# Patient Record
Sex: Male | Born: 1950 | Race: Black or African American | Hispanic: No | Marital: Single | Smoking: Current every day smoker
Health system: Southern US, Community
[De-identification: ages and names within clinical notes are randomized; demographics above are authoritative.]

## PROBLEM LIST (undated history)

## (undated) DIAGNOSIS — F0390 Unspecified dementia without behavioral disturbance: Secondary | ICD-10-CM

## (undated) DIAGNOSIS — I639 Cerebral infarction, unspecified: Secondary | ICD-10-CM

## (undated) DIAGNOSIS — J449 Chronic obstructive pulmonary disease, unspecified: Secondary | ICD-10-CM

## (undated) DIAGNOSIS — Z720 Tobacco use: Secondary | ICD-10-CM

---

## 1999-07-26 DIAGNOSIS — I639 Cerebral infarction, unspecified: Secondary | ICD-10-CM

## 1999-07-26 HISTORY — DX: Cerebral infarction, unspecified: I63.9

## 2005-08-11 ENCOUNTER — Emergency Department (HOSPITAL_COMMUNITY): Admission: EM | Admit: 2005-08-11 | Discharge: 2005-08-11 | Payer: Self-pay | Admitting: Emergency Medicine

## 2005-08-26 ENCOUNTER — Emergency Department (HOSPITAL_COMMUNITY): Admission: EM | Admit: 2005-08-26 | Discharge: 2005-08-26 | Payer: Self-pay | Admitting: Emergency Medicine

## 2005-09-11 ENCOUNTER — Inpatient Hospital Stay (HOSPITAL_COMMUNITY): Admission: EM | Admit: 2005-09-11 | Discharge: 2005-09-13 | Payer: Self-pay | Admitting: Emergency Medicine

## 2011-06-26 ENCOUNTER — Emergency Department (HOSPITAL_COMMUNITY): Payer: Self-pay

## 2011-06-26 ENCOUNTER — Inpatient Hospital Stay (HOSPITAL_COMMUNITY)
Admission: EM | Admit: 2011-06-26 | Discharge: 2011-06-27 | DRG: 189 | Disposition: A | Discharge status: Home / Self Care | Payer: Self-pay | Attending: Internal Medicine | Admitting: Internal Medicine

## 2011-06-26 DIAGNOSIS — Z720 Tobacco use: Secondary | ICD-10-CM | POA: Diagnosis present

## 2011-06-26 DIAGNOSIS — J209 Acute bronchitis, unspecified: Secondary | ICD-10-CM | POA: Diagnosis present

## 2011-06-26 DIAGNOSIS — J4489 Other specified chronic obstructive pulmonary disease: Secondary | ICD-10-CM | POA: Diagnosis present

## 2011-06-26 DIAGNOSIS — J440 Chronic obstructive pulmonary disease with acute lower respiratory infection: Secondary | ICD-10-CM | POA: Diagnosis present

## 2011-06-26 DIAGNOSIS — J069 Acute upper respiratory infection, unspecified: Secondary | ICD-10-CM

## 2011-06-26 DIAGNOSIS — F17200 Nicotine dependence, unspecified, uncomplicated: Secondary | ICD-10-CM | POA: Diagnosis present

## 2011-06-26 DIAGNOSIS — J449 Chronic obstructive pulmonary disease, unspecified: Secondary | ICD-10-CM | POA: Diagnosis present

## 2011-06-26 DIAGNOSIS — J9600 Acute respiratory failure, unspecified whether with hypoxia or hypercapnia: Principal | ICD-10-CM | POA: Diagnosis present

## 2011-06-26 HISTORY — DX: Tobacco use: Z72.0

## 2011-06-26 HISTORY — DX: Cerebral infarction, unspecified: I63.9

## 2011-06-26 LAB — DIFFERENTIAL
Eosinophils Absolute: 0 10*3/uL (ref 0.0–0.7)
Lymphocytes Relative: 12 % (ref 12–46)
Lymphs Abs: 0.8 10*3/uL (ref 0.7–4.0)
Monocytes Absolute: 0.9 10*3/uL (ref 0.1–1.0)
Neutrophils Relative %: 73 % (ref 43–77)

## 2011-06-26 LAB — INFLUENZA PANEL BY PCR (TYPE A & B): Influenza A By PCR: NEGATIVE

## 2011-06-26 LAB — URINALYSIS, ROUTINE W REFLEX MICROSCOPIC
Bilirubin Urine: NEGATIVE
Glucose, UA: NEGATIVE mg/dL
Protein, ur: NEGATIVE mg/dL
Specific Gravity, Urine: 1.02 (ref 1.005–1.030)
Urobilinogen, UA: 0.2 mg/dL (ref 0.0–1.0)

## 2011-06-26 LAB — CBC
MCHC: 34.7 g/dL (ref 30.0–36.0)
WBC: 7 10*3/uL (ref 4.0–10.5)

## 2011-06-26 LAB — BASIC METABOLIC PANEL
BUN: 14 mg/dL (ref 6–23)
Chloride: 99 mEq/L (ref 96–112)
GFR calc non Af Amer: 59 mL/min — ABNORMAL LOW (ref >90)
Glucose, Bld: 99 mg/dL (ref 70–99)
Potassium: 4 mEq/L (ref 3.5–5.1)
Sodium: 137 mEq/L (ref 135–145)

## 2011-06-26 LAB — HEPATIC FUNCTION PANEL
ALT: 21 U/L (ref 0–53)
Alkaline Phosphatase: 59 U/L (ref 39–117)
Bilirubin, Direct: 0.1 mg/dL (ref 0.0–0.3)
Indirect Bilirubin: 0.5 mg/dL (ref 0.3–0.9)

## 2011-06-26 LAB — HIV ANTIBODY (ROUTINE TESTING W REFLEX): HIV: NONREACTIVE

## 2011-06-26 LAB — URINE MICROSCOPIC-ADD ON

## 2011-06-26 MED ORDER — PANTOPRAZOLE SODIUM 40 MG PO TBEC
40.0000 mg | DELAYED_RELEASE_TABLET | Freq: Every day | ORAL | Status: DC
  Administered 2011-06-26 – 2011-06-27 (×2): 40 mg via ORAL
  Filled 2011-06-26 (×2): qty 1

## 2011-06-26 MED ORDER — ALBUTEROL SULFATE (5 MG/ML) 0.5% IN NEBU
2.5000 mg | INHALATION_SOLUTION | RESPIRATORY_TRACT | Status: DC
  Administered 2011-06-26 – 2011-06-27 (×5): 2.5 mg via RESPIRATORY_TRACT
  Filled 2011-06-26 (×5): qty 0.5

## 2011-06-26 MED ORDER — ALBUTEROL SULFATE (5 MG/ML) 0.5% IN NEBU
5.0000 mg | INHALATION_SOLUTION | Freq: Once | RESPIRATORY_TRACT | Status: AC
  Administered 2011-06-26: 5 mg via RESPIRATORY_TRACT
  Filled 2011-06-26: qty 1

## 2011-06-26 MED ORDER — OXYCODONE HCL 5 MG PO TABS: 5.0000 mg | ORAL_TABLET | ORAL | Status: DC | PRN

## 2011-06-26 MED ORDER — METHYLPREDNISOLONE SODIUM SUCC 125 MG IJ SOLR
60.0000 mg | Freq: Four times a day (QID) | INTRAMUSCULAR | Status: DC
  Filled 2011-06-26: qty 2

## 2011-06-26 MED ORDER — TRAZODONE HCL 50 MG PO TABS: 25.0000 mg | ORAL_TABLET | Freq: Every evening | ORAL | Status: DC | PRN

## 2011-06-26 MED ORDER — ACETAMINOPHEN 325 MG PO TABS: 650.0000 mg | ORAL_TABLET | Freq: Four times a day (QID) | ORAL | Status: DC | PRN

## 2011-06-26 MED ORDER — IPRATROPIUM BROMIDE 0.02 % IN SOLN
0.5000 mg | Freq: Once | RESPIRATORY_TRACT | Status: AC
  Administered 2011-06-26: 0.5 mg via RESPIRATORY_TRACT
  Filled 2011-06-26: qty 2.5

## 2011-06-26 MED ORDER — THERA M PLUS PO TABS
1.0000 | ORAL_TABLET | Freq: Every day | ORAL | Status: DC
  Administered 2011-06-26 – 2011-06-27 (×2): 1 via ORAL
  Filled 2011-06-26 (×2): qty 1

## 2011-06-26 MED ORDER — BIOTENE DRY MOUTH MT LIQD
15.0000 mL | Freq: Three times a day (TID) | OROMUCOSAL | Status: DC
  Administered 2011-06-26 – 2011-06-27 (×3): 15 mL via OROMUCOSAL

## 2011-06-26 MED ORDER — GUAIFENESIN ER 600 MG PO TB12
1200.0000 mg | ORAL_TABLET | Freq: Two times a day (BID) | ORAL | Status: DC
  Administered 2011-06-26 – 2011-06-27 (×3): 1200 mg via ORAL
  Filled 2011-06-26 (×3): qty 2

## 2011-06-26 MED ORDER — IPRATROPIUM BROMIDE 0.02 % IN SOLN
0.5000 mg | RESPIRATORY_TRACT | Status: DC
  Administered 2011-06-26 – 2011-06-27 (×5): 0.5 mg via RESPIRATORY_TRACT
  Filled 2011-06-26 (×5): qty 2.5

## 2011-06-26 MED ORDER — POTASSIUM CHLORIDE IN NACL 20-0.9 MEQ/L-% IV SOLN
INTRAVENOUS | Status: DC
  Administered 2011-06-26: 1000 mL via INTRAVENOUS

## 2011-06-26 MED ORDER — ENOXAPARIN SODIUM 40 MG/0.4ML ~~LOC~~ SOLN
40.0000 mg | SUBCUTANEOUS | Status: DC
  Administered 2011-06-26 – 2011-06-27 (×2): 40 mg via SUBCUTANEOUS
  Filled 2011-06-26 (×2): qty 0.4

## 2011-06-26 MED ORDER — VITAMIN B-1 100 MG PO TABS
100.0000 mg | ORAL_TABLET | Freq: Every day | ORAL | Status: DC
  Administered 2011-06-26 – 2011-06-27 (×2): 100 mg via ORAL
  Filled 2011-06-26 (×2): qty 1

## 2011-06-26 MED ORDER — ACETAMINOPHEN 650 MG RE SUPP: 650.0000 mg | Freq: Four times a day (QID) | RECTAL | Status: DC | PRN

## 2011-06-26 MED ORDER — NICOTINE 21 MG/24HR TD PT24
21.0000 mg | MEDICATED_PATCH | Freq: Every day | TRANSDERMAL | Status: DC
  Administered 2011-06-26 – 2011-06-27 (×2): 21 mg via TRANSDERMAL
  Filled 2011-06-26 (×2): qty 1

## 2011-06-26 MED ORDER — METHYLPREDNISOLONE SODIUM SUCC 125 MG IJ SOLR
125.0000 mg | Freq: Once | INTRAMUSCULAR | Status: AC
  Administered 2011-06-26: 125 mg via INTRAVENOUS
  Filled 2011-06-26: qty 2

## 2011-06-26 MED ORDER — FOLIC ACID 1 MG PO TABS
1.0000 mg | ORAL_TABLET | Freq: Every day | ORAL | Status: DC
  Administered 2011-06-26 – 2011-06-27 (×2): 1 mg via ORAL
  Filled 2011-06-26 (×2): qty 1

## 2011-06-26 MED ORDER — SODIUM CHLORIDE 0.9 % IV BOLUS (SEPSIS)
500.0000 mL | Freq: Once | INTRAVENOUS | Status: AC
  Administered 2011-06-26: 1000 mL via INTRAVENOUS

## 2011-06-26 MED ORDER — DEXTROSE 5 % IV SOLN
500.0000 mg | INTRAVENOUS | Status: DC
  Administered 2011-06-26 – 2011-06-27 (×2): 500 mg via INTRAVENOUS
  Filled 2011-06-26 (×3): qty 500

## 2011-06-26 MED ORDER — PREDNISONE 20 MG PO TABS
40.0000 mg | ORAL_TABLET | Freq: Every day | ORAL | Status: DC
  Administered 2011-06-26 – 2011-06-27 (×2): 40 mg via ORAL
  Filled 2011-06-26 (×2): qty 2

## 2011-06-26 MED ORDER — DEXTROSE 5 % IV SOLN
INTRAVENOUS | Status: AC
  Filled 2011-06-26: qty 500

## 2011-06-26 MED ORDER — ONDANSETRON HCL 4 MG PO TABS: 4.0000 mg | ORAL_TABLET | Freq: Four times a day (QID) | ORAL | Status: DC | PRN

## 2011-06-26 MED ORDER — ALBUTEROL SULFATE (5 MG/ML) 0.5% IN NEBU: 2.5000 mg | INHALATION_SOLUTION | RESPIRATORY_TRACT | Status: DC | PRN

## 2011-06-26 MED ORDER — ONDANSETRON HCL 4 MG/2ML IJ SOLN: 4.0000 mg | Freq: Four times a day (QID) | INTRAMUSCULAR | Status: DC | PRN

## 2011-06-26 NOTE — Plan of Care (Signed)
Problem: Phase I Progression Outcomes Goal: Flu/PneumoVaccines if indicated Outcome: Not Applicable Date Met:  06/26/11 Patient refused

## 2011-06-26 NOTE — ED Notes (Signed)
Pt stated he has been sick w/ cold all week, become worse tonight

## 2011-06-26 NOTE — H&P (Signed)
PCP:   No primary provider on file.   Chief Complaint:  Worsening shortness of breath since last night  HPI: Jose Schaefer is an 60 y.o. African American male.   History of asthma since childhood, chronic wheezing worsened by his tobacco use of 40 years, no primary medical physician for about 20 years, gets episodic treatment for his wheezing when he gets incarcerated, but otherwise takes no outpatient medications and just tolerates it is shortness of breath, Reports progressively severe wheezing since last night, came into the emergency room and responded to nebulizations but when he got up to walk his oxygen saturation fell into the 70s. Hospitalist service was called to assist with management.  Patient denies fever, but he is having a cough productive of green sputum. Denies chest pain. Smokes one pack per day for 40 years. Marijuana occasionally. Cocaine last use yesterday.  Is currently on retired and on disability because of her injury to his neck, and a past history of stroke in 2001. Retired Occupational psychologist of [employer]:  The patient denies anorexia, fever, weight loss,, vision loss, decreased hearing, hoarseness, chest pain, syncope, , peripheral edema, balance deficits, hemoptysis, abdominal pain, melena, hematochezia, severe indigestion/heartburn, hematuria, incontinence, genital sores, muscle weakness, suspicious skin lesions, transient blindness, difficulty walking, depression, unusual weight change, abnormal bleeding, enlarged lymph nodes, angioedema, and breast masses.    Past Medical History  Diagnosis Date  . Asthma   . Tobacco abuse   . CVA (cerebral vascular accident) 2001    [place]    History reviewed. No pertinent past surgical history.  Medications: None HOME MEDS: Prior to Admission medications   Not on File     Allergies:  No Known Allergies  Social History:   reports that he has been smoking Cigarettes.  He has a 40 pack-year smoking  history. He does not have any smokeless tobacco history on file. He reports that he drinks alcohol. He reports that he uses illicit drugs (Cocaine and Marijuana).  Family History: No family history on file. Denies any significant family history  Physical Exam: Filed Vitals:   06/26/11 0052 06/26/11 0119 06/26/11 0241 06/26/11 0331  BP:   114/77   Pulse:   103   Temp:      TempSrc:      Resp:   18   Height:  5\' 6"  (1.676 m)    Weight:  56.7 kg (125 lb)    SpO2: 96%  96% 96%   Blood pressure 114/77, pulse 103, temperature 99.3 F (37.4 C), temperature source Oral, resp. rate 18, height 5\' 6"  (1.676 m), weight 56.7 kg (125 lb), SpO2 96.00%.  GEN:  Pleasant thin middle-aged African American gentleman in the stretcher; looks older than his stated age; moderate respiratory distress cooperative with exam PSYCH:  alert and oriented x4; does not appear anxious does not appear depressed; affect is normal HEENT: Mucous membranes pink and dry and anicteric; PERRLA; EOM intact; no cervical lymphadenopathy nor thyromegaly or carotid bruit; no JVD; Breasts:: Not examined CHEST WALL: No tenderness CHEST: Tachypneic; diffuse rhonchi and wheezing  HEART: Regular rate and rhythm; no murmurs rubs or gallops BACK: No kyphosis or scoliosis; no CVA tenderness ABDOMEN: Scaphoid soft non-tender; no masses, no organomegaly, normal abdominal bowel sounds;  Rectal Exam: Not done EXTREMITIES: ; age-appropriate arthropathy of the hands and knees; no edema; no ulcerations. Genitalia: not examined PULSES: 2+ and symmetric SKIN: Normal hydration no rash or ulceration CNS: Cranial nerves 2-12 grossly intact no focal  neurologic deficit   Labs & Imaging Results for orders placed during the hospital encounter of 06/26/11 (from the past 48 hour(s))  CBC     Status: Normal   Collection Time   06/26/11 12:50 AM      Component Value Range Comment   WBC 7.0  4.0 - 10.5 (K/uL)    RBC 4.84  4.22 - 5.81 (MIL/uL)     Hemoglobin 14.6  13.0 - 17.0 (g/dL)    HCT 91.4  78.2 - 95.6 (%)    MCV 87.0  78.0 - 100.0 (fL)    MCH 30.2  26.0 - 34.0 (pg)    MCHC 34.7  30.0 - 36.0 (g/dL)    RDW 21.3  08.6 - 57.8 (%)    Platelets 183  150 - 400 (K/uL)   DIFFERENTIAL     Status: Abnormal   Collection Time   06/26/11 12:50 AM      Component Value Range Comment   Neutrophils Relative 73  43 - 77 (%)    Neutro Abs 5.1  1.7 - 7.7 (K/uL)    Lymphocytes Relative 12  12 - 46 (%)    Lymphs Abs 0.8  0.7 - 4.0 (K/uL)    Monocytes Relative 13 (*) 3 - 12 (%)    Monocytes Absolute 0.9  0.1 - 1.0 (K/uL)    Eosinophils Relative 1  0 - 5 (%)    Eosinophils Absolute 0.0  0.0 - 0.7 (K/uL)    Basophils Relative 1  0 - 1 (%)    Basophils Absolute 0.1  0.0 - 0.1 (K/uL)   BASIC METABOLIC PANEL     Status: Abnormal   Collection Time   06/26/11 12:50 AM      Component Value Range Comment   Sodium 137  135 - 145 (mEq/L)    Potassium 4.0  3.5 - 5.1 (mEq/L)    Chloride 99  96 - 112 (mEq/L)    CO2 30  19 - 32 (mEq/L)    Glucose, Bld 99  70 - 99 (mg/dL)    BUN 14  6 - 23 (mg/dL)    Creatinine, Ser 4.69  0.50 - 1.35 (mg/dL)    Calcium 9.3  8.4 - 10.5 (mg/dL)    GFR calc non Af Amer 59 (*) >90 (mL/min)    GFR calc Af Amer 69 (*) >90 (mL/min)   TROPONIN I     Status: Normal   Collection Time   06/26/11 12:50 AM      Component Value Range Comment   Troponin I <0.30  <0.30 (ng/mL)    Dg Chest 2 View  06/26/2011  *RADIOLOGY REPORT*  Clinical Data: Shortness of breath and cough  CHEST - 2 VIEW  Comparison: Chest radiograph 09/12/2005  Findings: Stable heart and mediastinal contours.  Advanced emphysema.  No airspace disease, effusion or pneumothorax identified.  No acute bony abnormality.  IMPRESSION: Advanced emphysema.  Original Report Authenticated By: Britta Mccreedy, M.D.      Assessment Present on Admission:  .Acute respiratory failure .COPD (chronic obstructive pulmonary disease) with acute bronchitis .Asthma .Tobacco  abuse   PLAN: We'll admit this gentleman for treatment of his acute exacerbation of COPD; with steroids serial nebulizations; and will also give antibiotics. Have counseled on nicotine cessation and he says he is willing to quit; given him a nicotine patch. He may benefit from a social work consult to assist with setting up primary care followup  Other plans as per orders.  Jarred Purtee 06/26/2011, 4:37 AM

## 2011-06-26 NOTE — ED Notes (Signed)
While ambulating around unit, pt sats dropped into upper 70's per john-nt

## 2011-06-26 NOTE — ED Notes (Signed)
md made aware of sats, pt alert throughout, family at bedside.

## 2011-06-26 NOTE — ED Notes (Signed)
Pt asleep in bed, resp even

## 2011-06-26 NOTE — Progress Notes (Signed)
Subjective: This man feels much improved from when he was admitted yesterday. He is a long-time smoker. He has extremely bad emphysema. However, he denies any severe symptoms on exertion but he does describe dyspnea on exertion on occasions. He does not have any primary care followup.           Physical Exam: Blood pressure 121/75, pulse 87, temperature 99 F (37.2 C), temperature source Oral, resp. rate 20, height 5\' 8"  (1.727 m), weight 53.2 kg (117 lb 4.6 oz), SpO2 95.00%. He does look systemically well at rest. Is not toxic or septic. There is no increased work of breathing. There is no peripheral central cyanosis. Lung fields show poor air entry bilaterally with occasional wheezing.   Investigations:     Basic Metabolic Panel:  Basename 06/26/11 0050  NA 137  K 4.0  CL 99  CO2 30  GLUCOSE 99  BUN 14  CREATININE 1.27  CALCIUM 9.3  MG --  PHOS --   Liver Function Tests:  [facility] 06/26/11 0050  AST 35  ALT 21  ALKPHOS 59  BILITOT 0.6  PROT 7.7  ALBUMIN 3.8     CBC:  Basename 06/26/11 0050  WBC 7.0  NEUTROABS 5.1  HGB 14.6  HCT 42.1  MCV 87.0  PLT 183    Dg Chest 2 View  06/26/2011  *RADIOLOGY REPORT*  Clinical Data: Shortness of breath and cough  CHEST - 2 VIEW  Comparison: Chest radiograph 09/12/2005  Findings: Stable heart and mediastinal contours.  Advanced emphysema.  No airspace disease, effusion or pneumothorax identified.  No acute bony abnormality.  IMPRESSION: Advanced emphysema.  Original Report Authenticated By: Britta Mccreedy, M.D.      Medications: I have reviewed the patient's current medications.  Impression: 1. Exacerbation of COPD. 2. Ongoing tobacco abuse.     Plan: 1. Discontinue IV steroids. Start oral steroids. 2. Influenza test in view of recent fever. 3. Possible discharge home tomorrow if he continues to do well.     LOS: 0 days   GOSRANI,NIMISH C 06/26/2011, 10:08 AM

## 2011-06-26 NOTE — ED Notes (Signed)
Attempted to call report to floor, nurse busy will return call.

## 2011-06-26 NOTE — ED Provider Notes (Signed)
History     CSN: [id number] Arrival date & time: 06/26/2011 12:20 AM   First MD Initiated Contact with Patient 06/26/11 0024      Chief Complaint  Patient presents with  . Shortness of Breath    (Consider location/radiation/quality/duration/timing/severity/associated sxs/prior treatment) Patient is a 60 y.o. male presenting with shortness of breath. The history is provided by the patient.  Shortness of Breath  The current episode started 5 to 7 days ago. Associated symptoms include cough and shortness of breath. Pertinent negatives include no chest pain.   patient has had a cough and some shortness of breath the last few days. Some fatigue 2. He states that he feels bad. No chest pain. He smokes a pack of cigarettes every 2 days. No nausea or vomiting. Mild myalgias. He does not have an inhaler.  History reviewed. No pertinent past medical history.  History reviewed. No pertinent past surgical history.  No family history on file.  History  Substance Use Topics  . Smoking status: Current Everyday Smoker    Types: Cigarettes  . Smokeless tobacco: Not on file  . Alcohol Use: Yes     as much as i can get      Review of Systems  HENT: Negative for neck pain.   Respiratory: Positive for cough and shortness of breath.   Cardiovascular: Negative for chest pain.  Gastrointestinal: Negative for nausea, abdominal pain and constipation.  Genitourinary: Negative for hematuria and flank pain.  Musculoskeletal: Negative for back pain.    Allergies  Review of patient's allergies indicates no known allergies.  Home Medications  No current outpatient prescriptions on file.  BP 114/77  Pulse 103  Temp(Src) 99.3 F (37.4 C) (Oral)  Resp 18  Ht 5\' 6"  (1.676 m)  Wt 125 lb (56.7 kg)  BMI 20.18 kg/m2  SpO2 96%  Physical Exam  Nursing note and vitals reviewed. Constitutional: He is oriented to person, place, and time. He appears well-developed and well-nourished.  HENT:  Head:  Normocephalic and atraumatic.  Eyes: EOM are normal. Pupils are equal, round, and reactive to light.  Neck: Normal range of motion. Neck supple.  Cardiovascular: Regular rhythm and normal heart sounds.   No murmur heard.      Mild tachycardia  Pulmonary/Chest: Effort normal. No respiratory distress. He has wheezes.       Diffuse wheezes and harsh breath sounds  Abdominal: Soft. Bowel sounds are normal. He exhibits no distension and no mass. There is no tenderness. There is no rebound and no guarding.  Musculoskeletal: Normal range of motion. He exhibits no edema.  Neurological: He is alert and oriented to person, place, and time. No cranial nerve deficit.  Skin: Skin is warm and dry.  Psychiatric: He has a normal mood and affect.    ED Course  Procedures (including critical care time)  Labs Reviewed  DIFFERENTIAL - Abnormal; Notable for the following:    Monocytes Relative 13 (*)    All other components within normal limits  BASIC METABOLIC PANEL - Abnormal; Notable for the following:    GFR calc non Af Amer 59 (*)    GFR calc Af Amer 69 (*)    All other components within normal limits  CBC  TROPONIN I   Dg Chest 2 View  06/26/2011  *RADIOLOGY REPORT*  Clinical Data: Shortness of breath and cough  CHEST - 2 VIEW  Comparison: Chest radiograph 09/12/2005  Findings: Stable heart and mediastinal contours.  Advanced emphysema.  No airspace  disease, effusion or pneumothorax identified.  No acute bony abnormality.  IMPRESSION: Advanced emphysema.  Original Report Authenticated By: Britta Mccreedy, M.D.     1. COPD (chronic obstructive pulmonary disease)   2. URI (upper respiratory infection)      Date: 06/26/2011  Rate: 94  Rhythm: sinus tachycardia  QRS Axis: normal  Intervals: normal  ST/T Wave abnormalities: normal  Conduction Disutrbances:Q waves anteriorly.  Narrative Interpretation:   Old EKG Reviewed: none available    MDM  Patient presents with shortness of breath.  Suspect cough. He is a smoker but does not see doctors. He was initially wheezy. Has improved somewhat with a breathing treatment. His pulse ox is improved at rest, but he was ambulated his pulse ox dipped down to 79%. He'll be admitted to the hospital.        Juliet Rude. Rubin Payor, MD 06/26/11 [phone]

## 2011-06-26 NOTE — ED Notes (Signed)
Pt stated he is feeling much better after breathing treatment, family members at bedside.

## 2011-06-26 NOTE — ED Notes (Signed)
Dr.campbell to see pt for admit

## 2011-06-27 LAB — BASIC METABOLIC PANEL
BUN: 13 mg/dL (ref 6–23)
CO2: 27 mEq/L (ref 19–32)
GFR calc non Af Amer: 69 mL/min — ABNORMAL LOW (ref >90)
Glucose, Bld: 134 mg/dL — ABNORMAL HIGH (ref 70–99)
Potassium: 3.5 mEq/L (ref 3.5–5.1)

## 2011-06-27 LAB — CBC
Hemoglobin: 13.2 g/dL (ref 13.0–17.0)
MCH: 29.5 pg (ref 26.0–34.0)
MCHC: 34.4 g/dL (ref 30.0–36.0)
MCV: 85.9 fL (ref 78.0–100.0)

## 2011-06-27 MED ORDER — AZITHROMYCIN 500 MG PO TABS: 500.0000 mg | ORAL_TABLET | Freq: Every day | ORAL | Status: AC

## 2011-06-27 MED ORDER — PREDNISONE 20 MG PO TABS: ORAL_TABLET | ORAL | Status: DC

## 2011-06-27 NOTE — Discharge Summary (Signed)
Physician Discharge Summary  Patient ID: Jose Schaefer MRN: [id number] DOB/AGE: 08-02-1950 60 y.o.  Admit date: 06/26/2011 Discharge date: 06/27/2011    Discharge Diagnoses:  1. Exacerbation of COPD. 2. Ongoing tobacco abuse.   Current Discharge Medication List    START taking these medications   Details  azithromycin (ZITHROMAX) 500 MG tablet Take 1 tablet (500 mg total) by mouth daily. Qty: 5 tablet, Refills: 0    predniSONE (DELTASONE) 20 MG tablet Take 2 tablets daily for 3 days, then 1 tablet daily for 3 days, then half tablet daily for 3 days, then STOP. Qty: 12 tablet, Refills: 0        Discharged Condition: Improved and stable.    Consults: None.  Significant Diagnostic Studies: Dg Chest 2 View  06/26/2011  *RADIOLOGY REPORT*  Clinical Data: Shortness of breath and cough  CHEST - 2 VIEW  Comparison: Chest radiograph 09/12/2005  Findings: Stable heart and mediastinal contours.  Advanced emphysema.  No airspace disease, effusion or pneumothorax identified.  No acute bony abnormality.  IMPRESSION: Advanced emphysema.  Original Report Authenticated By: Britta Mccreedy, M.D.    Lab Results: Basic Metabolic Panel:  Basename 06/27/11 0500 06/26/11 0050  NA 142 137  K 3.5 4.0  CL 105 99  CO2 27 30  GLUCOSE 134* 99  BUN 13 14  CREATININE 1.12 1.27  CALCIUM 9.6 9.3  MG -- --  PHOS -- --   Liver Function Tests:  [facility] 06/26/11 0050  AST 35  ALT 21  ALKPHOS 59  BILITOT 0.6  PROT 7.7  ALBUMIN 3.8     CBC:  Basename 06/27/11 0500 06/26/11 0050  WBC 6.8 7.0  NEUTROABS -- 5.1  HGB 13.2 14.6  HCT 38.4* 42.1  MCV 85.9 87.0  PLT 190 183       Hospital Course: This 60 year old man was admitted with symptoms of worsening shortness of breath. He continues to smoke cigarettes. He was felt that he had an exacerbation of his COPD which is largely emphysema. He made good improvement in the hospital with intravenous steroids and then oral steroids associated with  antibiotics. Today he is feeling back to his baseline. He wishes to go home.  Discharge Exam: Blood pressure 137/91, pulse 75, temperature 98.2 F (36.8 C), temperature source Oral, resp. rate 20, height 5\' 8"  (1.727 m), weight 53.933 kg (118 lb 14.4 oz), SpO2 99.00%. He looks systemically well. There is no increased work of breathing. Lung fields are relatively clear with occasional scattered wheezing. There is no bronchial breathing. There are no crackles. There is no peripheral central cyanosis. Heart sounds are present and normal. He is alert and orientated.  Disposition: Home. I've told him it's important to quit smoking which he recognizes. I've also told him that he needs to get a primary care physician. He refuses to do this.  Discharge Orders    Future Orders Please Complete By Expires   Diet - low sodium heart healthy      Increase activity slowly      Discharge instructions      Comments:   Stop smoking.        SignedWilson Singer 06/27/2011, 11:59 AM

## 2011-06-27 NOTE — Progress Notes (Signed)
Patient d/c home, left floor via wheelchair No c/o pain at d/c Verbalized understanding of d/c instructions, RX's, and when he should follow up with a DR. Patient refused to have [place] make him a follow up appt. Hannalee Castor [employer]

## 2011-06-27 NOTE — Progress Notes (Signed)
CSW met with pt to discuss substance abuse. Pt does not feel this is a problem and declines any outpatient referrals.  Full note in shadow chart.  Jose Schaefer

## 2015-08-11 ENCOUNTER — Inpatient Hospital Stay (HOSPITAL_COMMUNITY): Payer: Medicare Other

## 2015-08-11 ENCOUNTER — Emergency Department (HOSPITAL_COMMUNITY): Payer: Medicare Other

## 2015-08-11 ENCOUNTER — Encounter (HOSPITAL_COMMUNITY): Payer: Self-pay | Admitting: Emergency Medicine

## 2015-08-11 ENCOUNTER — Inpatient Hospital Stay (HOSPITAL_COMMUNITY)
Admission: EM | Admit: 2015-08-11 | Discharge: 2015-08-13 | DRG: 065 | Disposition: A | Discharge status: Home Health | Payer: Medicare Other | Attending: Family Medicine | Admitting: Family Medicine

## 2015-08-11 DIAGNOSIS — R1310 Dysphagia, unspecified: Secondary | ICD-10-CM | POA: Diagnosis present

## 2015-08-11 DIAGNOSIS — N183 Chronic kidney disease, stage 3 (moderate): Secondary | ICD-10-CM | POA: Diagnosis present

## 2015-08-11 DIAGNOSIS — I639 Cerebral infarction, unspecified: Secondary | ICD-10-CM | POA: Diagnosis present

## 2015-08-11 DIAGNOSIS — Z720 Tobacco use: Secondary | ICD-10-CM | POA: Diagnosis present

## 2015-08-11 DIAGNOSIS — I69322 Dysarthria following cerebral infarction: Secondary | ICD-10-CM | POA: Diagnosis not present

## 2015-08-11 DIAGNOSIS — F17210 Nicotine dependence, cigarettes, uncomplicated: Secondary | ICD-10-CM | POA: Diagnosis present

## 2015-08-11 DIAGNOSIS — F1410 Cocaine abuse, uncomplicated: Secondary | ICD-10-CM | POA: Diagnosis present

## 2015-08-11 DIAGNOSIS — I63012 Cerebral infarction due to thrombosis of left vertebral artery: Secondary | ICD-10-CM

## 2015-08-11 DIAGNOSIS — I69391 Dysphagia following cerebral infarction: Secondary | ICD-10-CM

## 2015-08-11 DIAGNOSIS — N179 Acute kidney failure, unspecified: Secondary | ICD-10-CM | POA: Diagnosis present

## 2015-08-11 LAB — RAPID URINE DRUG SCREEN, HOSP PERFORMED
Amphetamines: NOT DETECTED
Barbiturates: NOT DETECTED
Benzodiazepines: NOT DETECTED
Cocaine: POSITIVE — AB
Opiates: NOT DETECTED
Tetrahydrocannabinol: NOT DETECTED

## 2015-08-11 LAB — URINALYSIS, ROUTINE W REFLEX MICROSCOPIC
Bilirubin Urine: NEGATIVE
Glucose, UA: NEGATIVE mg/dL
Hgb urine dipstick: NEGATIVE
Ketones, ur: NEGATIVE mg/dL
Leukocytes, UA: NEGATIVE
Nitrite: NEGATIVE
Protein, ur: NEGATIVE mg/dL
Specific Gravity, Urine: 1.01 (ref 1.005–1.030)
pH: 7 (ref 5.0–8.0)

## 2015-08-11 LAB — COMPREHENSIVE METABOLIC PANEL
ALT: 10 U/L — ABNORMAL LOW (ref 17–63)
AST: 12 U/L — ABNORMAL LOW (ref 15–41)
Albumin: 3.5 g/dL (ref 3.5–5.0)
Alkaline Phosphatase: 56 U/L (ref 38–126)
Anion gap: 6 (ref 5–15)
BUN: 13 mg/dL (ref 6–20)
CO2: 29 mmol/L (ref 22–32)
Calcium: 9 mg/dL (ref 8.9–10.3)
Chloride: 109 mmol/L (ref 101–111)
Creatinine, Ser: 1.39 mg/dL — ABNORMAL HIGH (ref 0.61–1.24)
GFR calc Af Amer: 60 mL/min — ABNORMAL LOW (ref >60)
GFR calc non Af Amer: 52 mL/min — ABNORMAL LOW (ref >60)
Glucose, Bld: 95 mg/dL (ref 65–99)
Potassium: 4.3 mmol/L (ref 3.5–5.1)
Sodium: 144 mmol/L (ref 135–145)
Total Bilirubin: 0.7 mg/dL (ref 0.3–1.2)
Total Protein: 6.9 g/dL (ref 6.5–8.1)

## 2015-08-11 LAB — DIFFERENTIAL
Basophils Absolute: 0.1 10*3/uL (ref 0.0–0.1)
Basophils Relative: 2 %
Eosinophils Absolute: 0.4 10*3/uL (ref 0.0–0.7)
Eosinophils Relative: 9 %
Lymphocytes Relative: 25 %
Lymphs Abs: 1.2 10*3/uL (ref 0.7–4.0)
Monocytes Absolute: 0.3 10*3/uL (ref 0.1–1.0)
Monocytes Relative: 7 %
Neutro Abs: 2.7 10*3/uL (ref 1.7–7.7)
Neutrophils Relative %: 57 %

## 2015-08-11 LAB — CBC
HCT: 40 % (ref 39.0–52.0)
Hemoglobin: 13.2 g/dL (ref 13.0–17.0)
MCH: 28.3 pg (ref 26.0–34.0)
MCHC: 33 g/dL (ref 30.0–36.0)
MCV: 85.8 fL (ref 78.0–100.0)
Platelets: 213 10*3/uL (ref 150–400)
RBC: 4.66 MIL/uL (ref 4.22–5.81)
RDW: 13.8 % (ref 11.5–15.5)
WBC: 4.7 10*3/uL (ref 4.0–10.5)

## 2015-08-11 LAB — I-STAT CHEM 8, ED
BUN: 12 mg/dL (ref 6–20)
Calcium, Ion: 1.2 mmol/L (ref 1.13–1.30)
Chloride: 106 mmol/L (ref 101–111)
Creatinine, Ser: 1.4 mg/dL — ABNORMAL HIGH (ref 0.61–1.24)
Glucose, Bld: 91 mg/dL (ref 65–99)
HCT: 44 % (ref 39.0–52.0)
Hemoglobin: 15 g/dL (ref 13.0–17.0)
Potassium: 4.2 mmol/L (ref 3.5–5.1)
Sodium: 144 mmol/L (ref 135–145)
TCO2: 26 mmol/L (ref 0–100)

## 2015-08-11 LAB — PROTIME-INR
INR: 1.1 (ref 0.00–1.49)
Prothrombin Time: 14.4 seconds (ref 11.6–15.2)

## 2015-08-11 LAB — I-STAT TROPONIN, ED: Troponin i, poc: 0 ng/mL (ref 0.00–0.08)

## 2015-08-11 LAB — ETHANOL: Alcohol, Ethyl (B): <5 mg/dL (ref <5)

## 2015-08-11 LAB — APTT: aPTT: 30 seconds (ref 24–37)

## 2015-08-11 MED ORDER — ASPIRIN 81 MG PO CHEW
324.0000 mg | CHEWABLE_TABLET | Freq: Once | ORAL | Status: AC
  Administered 2015-08-11: 324 mg via ORAL
  Filled 2015-08-11: qty 4

## 2015-08-11 MED ORDER — ASPIRIN 300 MG RE SUPP
300.0000 mg | Freq: Every day | RECTAL | Status: DC
  Administered 2015-08-11: 300 mg via RECTAL
  Filled 2015-08-11: qty 1

## 2015-08-11 MED ORDER — ASPIRIN 325 MG PO TABS
325.0000 mg | ORAL_TABLET | Freq: Every day | ORAL | Status: DC
  Administered 2015-08-12 – 2015-08-13 (×2): 325 mg via ORAL
  Filled 2015-08-11 (×2): qty 1

## 2015-08-11 MED ORDER — STROKE: EARLY STAGES OF RECOVERY BOOK
Freq: Once | Status: DC
  Filled 2015-08-11: qty 1

## 2015-08-11 MED ORDER — ENOXAPARIN SODIUM 40 MG/0.4ML ~~LOC~~ SOLN
40.0000 mg | SUBCUTANEOUS | Status: DC
Start: 2015-08-11 — End: 2015-08-13
  Administered 2015-08-12 – 2015-08-13 (×2): 40 mg via SUBCUTANEOUS
  Filled 2015-08-11 (×2): qty 0.4

## 2015-08-11 MED ORDER — SENNOSIDES-DOCUSATE SODIUM 8.6-50 MG PO TABS: 1.0000 | ORAL_TABLET | Freq: Every evening | ORAL | Status: DC | PRN

## 2015-08-11 NOTE — Progress Notes (Signed)
RN swallow screen. Had originally passed in ED. Ordered a SLP eval and treatment because when eating dinner, pt began to cough with each bite. Removed tray.

## 2015-08-11 NOTE — ED Notes (Signed)
Pt says he was normal last night when he went to bed at 2300.  Daughter notice slurred speech.  Dr Juleen China at bedside.

## 2015-08-11 NOTE — H&P (Signed)
[facility]          History and Physical    PCP:   No primary care provider on file.   EDP: Virgel Manifold, MD  Chief Complaint:  Slurred speech, facial droop, drooling  HPI: Patient is a 65 year old man without past medical history who does not have regular medical care who presented to the hospital with the above complaints. He does smoke about a pack a day and uses cocaine, last use was 2 days ago. Per daughter she last saw him normal at about 11:00 last night. When she woke up this morning he asked him a question and noticed that his speech was slurred. When she went into the room to look at him the right side of his face was drooping and he was drooling. She thought he had had a stroke and brought him into the hospital for evaluation. CT scan showed age indeterminate lacunar infarct of the left thalamus. Creatinine was 1.39, rest of lab work is within normal limits, UDS positive for cocaine. We have been asked to admit him for further evaluation and management.  Allergies:  No Known Allergies    Past Medical History  Diagnosis Date  . Asthma   . Tobacco abuse   . CVA (cerebral vascular accident) [facility]) 2001    [place]    History reviewed. No pertinent past surgical history.  Prior to Admission medications   Not on File    Social History:  reports that he has been smoking Cigarettes.  He has a 20 pack-year smoking history. He does not have any smokeless tobacco history on file. He reports that he drinks about 8.4 oz of alcohol per week. He reports that he uses illicit drugs (Cocaine and Marijuana).  History reviewed. No pertinent family history. specifically negative for stroke, heart attacks  Review of Systems:  Constitutional: Denies fever, chills, diaphoresis, appetite change and fatigue.  HEENT: Denies photophobia, eye pain, redness, hearing loss, ear pain, congestion, sore throat, rhinorrhea, sneezing, mouth sores, trouble swallowing, neck  pain, neck stiffness and tinnitus.   Respiratory: Denies SOB, DOE, cough, chest tightness,  and wheezing.   Cardiovascular: Denies chest pain, palpitations and leg swelling.  Gastrointestinal: Denies nausea, vomiting, abdominal pain, diarrhea, constipation, blood in stool and abdominal distention.  Genitourinary: Denies dysuria, urgency, frequency, hematuria, flank pain and difficulty urinating.  Endocrine: Denies: hot or cold intolerance, sweats, changes in hair or nails, polyuria, polydipsia. Musculoskeletal: Denies myalgias, back pain, joint swelling, arthralgias and gait problem.  Skin: Denies pallor, rash and wound.  Neurological: Denies dizziness, seizures, syncope, weakness, light-headedness and headaches.  Hematological: Denies adenopathy. Easy bruising, personal or family bleeding history  Psychiatric/Behavioral: Denies suicidal ideation, mood changes, confusion, nervousness, sleep disturbance and agitation   Physical Exam: Blood pressure 172/95, pulse 60, temperature 97.8 F (36.6 C), temperature source Oral, resp. rate 18, height 5' 8" (1.727 m), weight 51.71 kg (114 lb), SpO2 95 %. General: Alert, awake, oriented 3, slurred speech HEENT: Normocephalic, atraumatic, pupils equal round and reactive to light, extraocular movements intact, moist mucous membranes, poor dentition Neck:: Supple, no JVD, no lymphadenopathy, no bruits, no goiter Cardiovascular: Regular rate and rhythm, no murmurs, rubs or gallops Lungs: Clear to auscultation bilaterally Abdomen: Soft, nontender, nondistended, positive bowel sounds Extremities: No clubbing, cyanosis or edema, positive pulses Neurologic: Mental status intact, significant slurred speech, right facial drooping, muscle strength 5 over 5 bilateral and symmetric, sensation intact  to light touch, proprioception intact, finger-to-nose normal bilaterally, Babinski downgoing  Labs on Admission:  Results for orders placed or performed during the  hospital encounter of 08/11/15 (from the past 48 hour(s))  Ethanol     Status: None   Collection Time: 08/11/15  9:38 AM  Result Value Ref Range   Alcohol, Ethyl (B) <5 <5 mg/dL    Comment:        LOWEST DETECTABLE LIMIT FOR SERUM ALCOHOL IS 5 mg/dL FOR MEDICAL PURPOSES ONLY   Protime-INR     Status: None   Collection Time: 08/11/15  9:42 AM  Result Value Ref Range   Prothrombin Time 14.4 11.6 - 15.2 seconds   INR 1.10 0.00 - 1.49  APTT     Status: None   Collection Time: 08/11/15  9:42 AM  Result Value Ref Range   aPTT 30 24 - 37 seconds  CBC     Status: None   Collection Time: 08/11/15  9:42 AM  Result Value Ref Range   WBC 4.7 4.0 - 10.5 K/uL   RBC 4.66 4.22 - 5.81 MIL/uL   Hemoglobin 13.2 13.0 - 17.0 g/dL   HCT 40.0 39.0 - 52.0 %   MCV 85.8 78.0 - 100.0 fL   MCH 28.3 26.0 - 34.0 pg   MCHC 33.0 30.0 - 36.0 g/dL   RDW 13.8 11.5 - 15.5 %   Platelets 213 150 - 400 K/uL  Differential     Status: None   Collection Time: 08/11/15  9:42 AM  Result Value Ref Range   Neutrophils Relative % 57 %   Neutro Abs 2.7 1.7 - 7.7 K/uL   Lymphocytes Relative 25 %   Lymphs Abs 1.2 0.7 - 4.0 K/uL   Monocytes Relative 7 %   Monocytes Absolute 0.3 0.1 - 1.0 K/uL   Eosinophils Relative 9 %   Eosinophils Absolute 0.4 0.0 - 0.7 K/uL   Basophils Relative 2 %   Basophils Absolute 0.1 0.0 - 0.1 K/uL  Comprehensive metabolic panel     Status: Abnormal   Collection Time: 08/11/15  9:42 AM  Result Value Ref Range   Sodium 144 135 - 145 mmol/L   Potassium 4.3 3.5 - 5.1 mmol/L   Chloride 109 101 - 111 mmol/L   CO2 29 22 - 32 mmol/L   Glucose, Bld 95 65 - 99 mg/dL   BUN 13 6 - 20 mg/dL   Creatinine, Ser 1.39 (H) 0.61 - 1.24 mg/dL   Calcium 9.0 8.9 - 10.3 mg/dL   Total Protein 6.9 6.5 - 8.1 g/dL   Albumin 3.5 3.5 - 5.0 g/dL   AST 12 (L) 15 - 41 U/L   ALT 10 (L) 17 - 63 U/L   Alkaline Phosphatase 56 38 - 126 U/L   Total Bilirubin 0.7 0.3 - 1.2 mg/dL   GFR calc non Af Amer 52 (L) >60 mL/min    GFR calc Af Amer 60 (L) >60 mL/min    Comment: (NOTE) The eGFR has been calculated using the CKD EPI equation. This calculation has not been validated in all clinical situations. eGFR's persistently <60 mL/min signify possible Chronic Kidney Disease.    Anion gap 6 5 - 15  I-stat troponin, ED (not at [facility])     Status: None   Collection Time: 08/11/15  9:48 AM  Result Value Ref Range   Troponin i, poc 0.00 0.00 - 0.08 ng/mL   Comment 3  Comment: Due to the release kinetics of cTnI, a negative result within the first hours of the onset of symptoms does not rule out myocardial infarction with certainty. If myocardial infarction is still suspected, repeat the test at appropriate intervals.   I-Stat Chem 8, ED  (not at [facility])     Status: Abnormal   Collection Time: 08/11/15  9:50 AM  Result Value Ref Range   Sodium 144 135 - 145 mmol/L   Potassium 4.2 3.5 - 5.1 mmol/L   Chloride 106 101 - 111 mmol/L   BUN 12 6 - 20 mg/dL   Creatinine, Ser 1.40 (H) 0.61 - 1.24 mg/dL   Glucose, Bld 91 65 - 99 mg/dL   Calcium, Ion 1.20 1.13 - 1.30 mmol/L   TCO2 26 0 - 100 mmol/L   Hemoglobin 15.0 13.0 - 17.0 g/dL   HCT 44.0 39.0 - 52.0 %  Urine rapid drug screen (hosp performed)not at [facility]     Status: Abnormal   Collection Time: 08/11/15 10:51 AM  Result Value Ref Range   Opiates NONE DETECTED NONE DETECTED   Cocaine POSITIVE (A) NONE DETECTED   Benzodiazepines NONE DETECTED NONE DETECTED   Amphetamines NONE DETECTED NONE DETECTED   Tetrahydrocannabinol NONE DETECTED NONE DETECTED   Barbiturates NONE DETECTED NONE DETECTED    Comment:        DRUG SCREEN FOR MEDICAL PURPOSES ONLY.  IF CONFIRMATION IS NEEDED FOR ANY PURPOSE, NOTIFY LAB WITHIN 5 DAYS.        LOWEST DETECTABLE LIMITS FOR URINE DRUG SCREEN Drug Class       Cutoff (ng/mL) Amphetamine      1000 Barbiturate      200 Benzodiazepine   160 Tricyclics       737 Opiates          300 Cocaine          300 THC               50   Urinalysis, Routine w reflex microscopic (not at [facility])     Status: None   Collection Time: 08/11/15 10:51 AM  Result Value Ref Range   Color, Urine YELLOW YELLOW   APPearance CLEAR CLEAR   Specific Gravity, Urine 1.010 1.005 - 1.030   pH 7.0 5.0 - 8.0   Glucose, UA NEGATIVE NEGATIVE mg/dL   Hgb urine dipstick NEGATIVE NEGATIVE   Bilirubin Urine NEGATIVE NEGATIVE   Ketones, ur NEGATIVE NEGATIVE mg/dL   Protein, ur NEGATIVE NEGATIVE mg/dL   Nitrite NEGATIVE NEGATIVE   Leukocytes, UA NEGATIVE NEGATIVE    Comment: MICROSCOPIC NOT DONE ON URINES WITH NEGATIVE PROTEIN, BLOOD, LEUKOCYTES, NITRITE, OR GLUCOSE <1000 mg/dL.    Radiological Exams on Admission: Ct Head Wo Contrast  08/11/2015  CLINICAL DATA:  Dysarthria, left facial droop EXAM: CT HEAD WITHOUT CONTRAST TECHNIQUE: Contiguous axial images were obtained from the base of the skull through the vertex without intravenous contrast. COMPARISON:  09/12/2005 FINDINGS: There is no evidence of mass effect, midline shift or extra-axial fluid collections. There is no evidence of a space-occupying lesion or intracranial hemorrhage. There is no evidence of a cortical-based area of acute infarction. There is periventricular white matter low attenuation. There is a age-indeterminate lacunar infarct of the left thalamus. The ventricles and sulci are appropriate for the patient's age. The basal cisterns are patent. Visualized portions of the orbits are unremarkable. Mild bilateral ethmoid sinus mucosal thickening. Left sphenoid sinus mucosal thickening. Right maxillary sinus mucosal thickening. The osseous  structures are unremarkable. IMPRESSION: 1. Age-indeterminate lacunar infarct of the left thalamus. Electronically Signed   By: Kathreen Devoid   On: 08/11/2015 10:54    Assessment/Plan Principal Problem:   CVA (cerebral infarction) Active Problems:   Tobacco abuse   Cocaine abuse   ARF (acute renal failure) (HCC)     Acute  CVA -Check MRI, check carotid Dopplers, check 2-D echo, check lipid status. -Start aspirin for secondary stroke prevention. -Counseled on reducing risk factors including tobacco abuse, substance abuse. -Will need regular medical care. -Neurology consult requested.   Acute renal failure -Start IV fluids and recheck renal function in a.m.     Tobacco/Substance abuse -Counseled on cessation        DVT prophylaxis -Lovenox  CODE STATUS -Full code                                                    Time Spent on Admission: 75 minutes  HERNANDEZ ACOSTA,Keyerra Lamere [facility] Pager: [phone] 08/11/2015, 5:16 PM

## 2015-08-11 NOTE — ED Notes (Signed)
Attempted to call report, RN unavailable.

## 2015-08-11 NOTE — Progress Notes (Signed)
2114 BP-182/106 & consistently running high. No PRN BP medications noted at this time. Dr.Le notified.

## 2015-08-11 NOTE — ED Provider Notes (Signed)
CSN: [id number]     Arrival date & time 08/11/15  7829 History   First MD Initiated Contact with Patient 08/11/15 [phone]     Chief Complaint  Patient presents with  . Stroke Symptoms     (Consider location/radiation/quality/duration/timing/severity/associated sxs/prior Treatment) HPI   65 year old male with slurred speech and right facial droop. First noticed this morning. Patient reports that he felt like his normal self last night when he went to bed at approximately 11 AM. This morning he woke up and noticed slurred speech. Daughters at bedside and restore status patient is very difficult to understand this morning he was drooling from the right side of his mouth. Family reports he is a smoker and abuses cocaine. Has medical care through [place] system but this is inconsistent.   Past Medical History  Diagnosis Date  . Asthma   . Tobacco abuse   . CVA (cerebral vascular accident) [facility]) 2001    [place]   History reviewed. No pertinent past surgical history. History reviewed. No pertinent family history. Social History  Substance Use Topics  . Smoking status: Current Every Day Smoker -- 0.50 packs/day for 40 years    Types: Cigarettes  . Smokeless tobacco: None  . Alcohol Use: 8.4 oz/week    4 Cans of beer, 10 Shots of liquor per week     Comment: as much as i can get    Review of Systems  All systems reviewed and negative, other than as noted in HPI.   Allergies  Review of patient's allergies indicates no known allergies.  Home Medications   Prior to Admission medications   Not on File   BP 167/102 mmHg  Pulse 72  Temp(Src) 97.5 F (36.4 C) (Oral)  Resp 16  Ht  (1.727 m)  Wt 120 lb (54.432 kg)  BMI 18.25 kg/m2  SpO2 96% Physical Exam  Constitutional: He is oriented to person, place, and time. He appears well-developed. No distress.  Laying in bed. Thin body habitus. NAD.   HENT:  Head: Normocephalic and atraumatic.  Eyes: Conjunctivae are normal. Right  eye exhibits no discharge. Left eye exhibits no discharge.  Neck: Neck supple.  Cardiovascular: Normal rate, regular rhythm and normal heart sounds.  Exam reveals no gallop and no friction rub.   No murmur heard. Pulmonary/Chest: Effort normal and breath sounds normal. No respiratory distress.  Abdominal: Soft. He exhibits no distension. There is no tenderness.  Musculoskeletal: He exhibits no edema or tenderness.  Neurological: He is alert and oriented to person, place, and time. A cranial nerve deficit is present. He exhibits normal muscle tone.  Severe dysarthria. Answers appropriately. Follows commands. R facial droop. CN 2-12 otherwise intact. Strength 5/5 b/l u/l extremities.   Skin: Skin is warm and dry.  Psychiatric: He has a normal mood and affect. His behavior is normal. Thought content normal.  Nursing note and vitals reviewed.   ED Course  Procedures (including critical care time) Labs Review Labs Reviewed  COMPREHENSIVE METABOLIC PANEL - Abnormal; Notable for the following:    Creatinine, Ser 1.39 (*)    AST 12 (*)    ALT 10 (*)    GFR calc non Af Amer 52 (*)    GFR calc Af Amer 60 (*)    All other components within normal limits  I-STAT CHEM 8, ED - Abnormal; Notable for the following:    Creatinine, Ser 1.40 (*)    All other components within normal limits  ETHANOL  PROTIME-INR  APTT  CBC  DIFFERENTIAL  URINE RAPID DRUG SCREEN, HOSP PERFORMED  URINALYSIS, ROUTINE W REFLEX MICROSCOPIC (NOT AT [facility])  I-STAT TROPOININ, ED    Imaging Review Ct Head Wo Contrast  08/11/2015  CLINICAL DATA:  Dysarthria, left facial droop EXAM: CT HEAD WITHOUT CONTRAST TECHNIQUE: Contiguous axial images were obtained from the base of the skull through the vertex without intravenous contrast. COMPARISON:  09/12/2005 FINDINGS: There is no evidence of mass effect, midline shift or extra-axial fluid collections. There is no evidence of a space-occupying lesion or intracranial hemorrhage.  There is no evidence of a cortical-based area of acute infarction. There is periventricular white matter low attenuation. There is a age-indeterminate lacunar infarct of the left thalamus. The ventricles and sulci are appropriate for the patient's age. The basal cisterns are patent. Visualized portions of the orbits are unremarkable. Mild bilateral ethmoid sinus mucosal thickening. Left sphenoid sinus mucosal thickening. Right maxillary sinus mucosal thickening. The osseous structures are unremarkable. IMPRESSION: 1. Age-indeterminate lacunar infarct of the left thalamus. Electronically Signed   By: Elige Ko   On: 08/11/2015 10:54   I have personally reviewed and evaluated these images and lab results as part of my medical decision-making.   EKG Interpretation   Date/Time:  Tuesday August 11 2015 09:19:11 EST Ventricular Rate:  61 PR Interval:  167 QRS Duration: 80 QT Interval:  424 QTC Calculation: 427 R Axis:   60 Text Interpretation:  Sinus rhythm Anteroseptal infarct, old ED PHYSICIAN  INTERPRETATION AVAILABLE IN [facility] Confirmed by TEST, Record  ([id number]) on 08/12/2015 7:37:56 AM      MDM   Final diagnoses:  Cerebral infarction due to unspecified mechanism    65 year old male with symptoms consistent with an acute CVA. Right-sided facial weakness and dysarthria. Not TPA candidate because he presented approximately 10 hours after last seen normal.  Raeford Razor, MD 08/18/15 [phone]

## 2015-08-11 NOTE — Progress Notes (Signed)
2150 Spoke with Dr.Le regarding patient's BP, no new orders given at this time.

## 2015-08-12 ENCOUNTER — Inpatient Hospital Stay (HOSPITAL_COMMUNITY): Payer: Medicare Other

## 2015-08-12 DIAGNOSIS — I639 Cerebral infarction, unspecified: Principal | ICD-10-CM | POA: Insufficient documentation

## 2015-08-12 DIAGNOSIS — F1410 Cocaine abuse, uncomplicated: Secondary | ICD-10-CM

## 2015-08-12 LAB — LIPID PANEL
Cholesterol: 156 mg/dL (ref 0–200)
HDL: 43 mg/dL (ref >40)
LDL Cholesterol: 100 mg/dL — ABNORMAL HIGH (ref 0–99)
Total CHOL/HDL Ratio: 3.6 RATIO
Triglycerides: 65 mg/dL (ref <150)
VLDL: 13 mg/dL (ref 0–40)

## 2015-08-12 MED ORDER — ATORVASTATIN CALCIUM 40 MG PO TABS: 40.0000 mg | ORAL_TABLET | Freq: Every day | ORAL | Status: DC

## 2015-08-12 MED ORDER — CETYLPYRIDINIUM CHLORIDE 0.05 % MT LIQD
7.0000 mL | Freq: Two times a day (BID) | OROMUCOSAL | Status: DC
  Administered 2015-08-12 – 2015-08-13 (×2): 7 mL via OROMUCOSAL

## 2015-08-12 MED ORDER — RESOURCE THICKENUP CLEAR PO POWD
ORAL | Status: DC | PRN
  Filled 2015-08-12: qty 125

## 2015-08-12 MED ORDER — PERFLUTREN LIPID MICROSPHERE
1.0000 mL | INTRAVENOUS | Status: DC | PRN
  Filled 2015-08-12: qty 10

## 2015-08-12 NOTE — Progress Notes (Signed)
PROGRESS NOTE  Jose Schaefer NWG:[id number] DOB: 07/09/1951 DOA: 08/11/2015 PCP: No primary care provider on file.  Summary: 70 yom without past medical history presented to the ED with slurred speech, a facial droop, and drooling. While in the ED, CT scan showed age indeterminate lacunar infarct of the left thalamus. Creatinine was 1.39, rest of lab work is within normal limits, UDS positive for cocaine. He was admitted for further evaluation and management.  Assessment/Plan: 1. Acute nonhemorrhagic left frontal lobe infarct. Multiple remote old infarcts. Evaluated by physical and occupational therapy was no needs. Deficit limited to dysphagia and dysarthria. LDL 100. Carotid Dopplers and echocardiogram unremarkable. 2. Suspected chronic kidney disease stage III. 3. Cocaine abuse. Likely a major factor in acute stroke. 4. Tobacco use disorder.    Overall appears to be doing well. Plan home speech therapy.  Continue aspirin. Add statin. Follow-up neurology recommendations.  Code Status: Full DVT prophylaxis: Lovenox Family Communication:  Disposition Plan: Home likely 1/19.  Brendia Sacks, MD  [facility]  Pager [phone] If 7PM-7AM, please contact night-coverage at [email or web address], password [facility] 08/12/2015, 7:35 AM  LOS: 1 day   Consultants:    Procedures:    Antibiotics:    HPI/Subjective: No complaints.  Objective: Filed Vitals:   08/11/15 2230 08/12/15 0030 08/12/15 0230 08/12/15 0430  BP: 157/110 136/89 142/99 135/99  Pulse: 67 70 77 67  Temp: 98.4 F (36.9 C) 98.3 F (36.8 C) 98.2 F (36.8 C) 97.9 F (36.6 C)  TempSrc: Oral Oral Oral Oral  Resp: Height:      Weight:      SpO2: 96% 95% 97% 95%    Intake/Output Summary (Last 24 hours) at 08/12/15 0735 Last data filed at 08/12/15 0030  Gross per 24 hour  Intake      0 ml  Output    475 ml  Net   -475 ml     Filed Weights   08/11/15 0914 08/11/15 1630  Weight: 54.432 kg (120  lb) 51.71 kg (114 lb)    Exam:     Afebrile, not hypoxic General:  Appears calm and comfortable Face appears asymmetric. Tongue deviates to the right. Cardiovascular: RRR, no m/r/g. No LE edema. Telemetry: Sinus bradycardia Respiratory: CTA bilaterally, no w/r/r. Normal respiratory effort. Abdomen: soft, ntnd Skin: no rash or induration noted Musculoskeletal: grossly normal tone BUE/BLE; moves all extremities well. Strength 5/5. Psychiatric: grossly normal mood and affect, speech dysarthric. Neurologic: Facial weakness, otherwise nonfocal  New data reviewed:  2-D echocardiogram, carotid ultrasound was unremarkable  LDL 100 MRI noted.    Pertinent data since admission:  Creatinine 1.39  CT head - Age-indeterminate lacunar infarct of the left thalamus.  Urine drug screen positive for cocaine    Scheduled Meds: .  stroke: mapping our early stages of recovery book   Does not apply Once  . aspirin  300 mg Rectal Daily   Or  . aspirin  325 mg Oral Daily  . enoxaparin (LOVENOX) injection  40 mg Subcutaneous Q24H   Continuous Infusions:   Principal Problem:   CVA (cerebral infarction) Active Problems:   Tobacco abuse   Cocaine abuse   ARF (acute renal failure) (HCC)   Time spent 20 minutes  By signing my name below, I, Burnett Harry attest that this documentation has been prepared under the direction and in the presence of Brendia Sacks, MD Electronically signed: Burnett Harry, Scribe.  08/12/2015  I personally performed the services described in this  documentation. All medical record entries made by the scribe were at my direction. I have reviewed the chart and agree that the record reflects my personal performance and is accurate and complete. Murray Hodgkins, MD

## 2015-08-12 NOTE — Progress Notes (Signed)
Returned to room, neurocheck completed.

## 2015-08-12 NOTE — Evaluation (Signed)
Occupational Therapy Evaluation Patient Details Name: Jose Schaefer MRN: [id number] DOB: 08/29/1950 Today's Date: 08/12/2015    History of Present Illness Patient is a 65 year old man without past medical history who does not have regular medical care who presented to the hospital with the above complaints. He does smoke about a pack a day and uses cocaine, last use was 2 days ago. Per daughter she last saw him normal at about 11:00 last night. When she woke up this morning he asked him a question and noticed that his speech was slurred. When she went into the room to look at him the right side of his face was drooping and he was drooling. She thought he had had a stroke and brought him into the hospital for evaluation. CT scan showed age indeterminate lacunar infarct of the left thalamus. Creatinine was 1.39, rest of lab work is within normal limits, UDS positive for cocaine. We have been asked to admit him for further evaluation and management.   Clinical Impression   Pt awake, alert, oriented to person, place, situation this am, agreeable to evaluation. Pt demonstrates independence in ADL tasks during evaluation, BUE ROM and strength WFL, coordination and sensation intact. Pt declined to complete standing or functional mobility tasks during evaluation. Pt continues to have expressive difficulties with speech that is very difficult to understand. Pt asking about breakfast, explained process of SLP evaluation prior to receiving a meal. No further OT services required at this time, pt plans to return home with daughter.     Follow Up Recommendations  No OT follow up          Precautions / Restrictions Restrictions Weight Bearing Restrictions: No      Mobility Bed Mobility Overal bed mobility: Independent                        ADL Overall ADL's : Independent                                       General ADL Comments: Pt demonstrates independence in ADL tasks  while seated     Vision Vision Assessment?: Yes Eye Alignment: Within Functional Limits Ocular Range of Motion: Within Functional Limits Alignment/Gaze Preference: Within Defined Limits Tracking/Visual Pursuits: Able to track stimulus in all quads without difficulty Saccades: Within functional limits Convergence: Within functional limits Visual Fields: No apparent deficits          Pertinent Vitals/Pain Pain Assessment: No/denies pain     Hand Dominance Right   Extremity/Trunk Assessment Upper Extremity Assessment Upper Extremity Assessment: Overall WFL for tasks assessed   Lower Extremity Assessment Lower Extremity Assessment: Defer to PT evaluation       Communication Communication Communication: Expressive difficulties (slurred speech, very difficult to understand)   Cognition Arousal/Alertness: Awake/alert Behavior During Therapy: WFL for tasks assessed/performed Overall Cognitive Status: Within Functional Limits for tasks assessed                                Home Living Family/patient expects to be discharged to:: Private residence Living Arrangements: Children Available Help at Discharge: Family;Available 24 hours/day               Bathroom Shower/Tub: Chief Strategy Officer: Standard     Home Equipment: None  Prior Functioning/Environment Level of Independence: Independent              End of Session    Activity Tolerance: Patient tolerated treatment well Patient left: in bed;with call bell/phone within reach   Time: 0810-0825 OT Time Calculation (min): 15 min Charges:  OT General Charges $OT Visit: 1 Procedure OT Evaluation $OT Eval Low Complexity: 1 Procedure  Ezra Sites, OTR/L  [phone]  08/12/2015, 8:25 AM

## 2015-08-12 NOTE — Progress Notes (Signed)
Neurocheck not done on scheduled time due to echocardiogram in progress.

## 2015-08-12 NOTE — Evaluation (Signed)
Physical Therapy Evaluation Patient Details Name: Jose Schaefer MRN: [id number] DOB: 10-22-1950 Today's Date: 08/12/2015   History of Present Illness  64yo black male who was found by daughter to be dysarthric with facial droop upon waking, brought in to [facility] by family. Pt admitted for CVA workup. PMH: CVA (10ya), ARF, and positive UDS for cocaine. At evaluation, daughter is in room adn assists with history as pt is difficult to understand.   Clinical Impression  Pt demonstrating dysarthria throughout session, very difficult to understand, but responds logically to yes/no questioning. Assessment of strength, gait, balance, transfers, bed mobility, strength, and sensation are all either symmetrical and WNL or any patent impairments are baseline and related to remote CVA 10ya. No additional PT need at this time. Recommending DC to home once pt is medically ready and cleared by SLP. PT signing off.       Follow Up Recommendations No PT follow up    Equipment Recommendations  None recommended by PT    Recommendations for Other Services       Precautions / Restrictions Precautions Precautions: None Restrictions Weight Bearing Restrictions: No      Mobility  Bed Mobility Overal bed mobility: Independent                Transfers Overall transfer level: Independent                  Ambulation/Gait Ambulation/Gait assistance: Modified independent (Device/Increase time) Ambulation Distance (Feet): 100 Feet Assistive device: None     Gait velocity interpretation: <1.8 ft/sec, indicative of risk for recurrent falls General Gait Details: 25% decreased weigthbearing on LLE, with asymetrical step timing and wide based gait. Daughter reports as baseline.   Stairs            Wheelchair Mobility    Modified Rankin (Stroke Patients Only)       Balance                                             Pertinent Vitals/Pain Pain Assessment: No/denies  pain ("hungry!")    Home Living Family/patient expects to be discharged to:: Private residence Living Arrangements: Children Available Help at Discharge: Family;Available PRN/intermittently Type of Home: House Home Access: Stairs to enter Entrance Stairs-Rails: Right Entrance Stairs-Number of Steps: 3 Home Layout: One level Home Equipment: None      Prior Function Level of Independence: Independent;Needs assistance   Gait / Transfers Assistance Needed: limited community distances only (up to 1067ft)   ADL's / Homemaking Assistance Needed: needs help with groceries, and some assistance with meals occasionally.         Hand Dominance   Dominant Hand: Right    Extremity/Trunk Assessment   Upper Extremity Assessment: Overall WFL for tasks assessed           Lower Extremity Assessment: Overall WFL for tasks assessed;LLE deficits/detail   LLE Deficits / Details: L hip weakness and decreased WB which daughter reports is chronic related to CVA 10ya.      Communication   Communication: Expressive difficulties  Cognition Arousal/Alertness: Awake/alert Behavior During Therapy: WFL for tasks assessed/performed Overall Cognitive Status: Within Functional Limits for tasks assessed                      General Comments General comments (skin integrity, edema, etc.): No LOB with gait or  sharpened Rhomberg.     Exercises        Assessment/Plan    PT Assessment Patent does not need any further PT services  PT Diagnosis     PT Problem List    PT Treatment Interventions     PT Goals (Current goals can be found in the Care Plan section) Acute Rehab PT Goals PT Goal Formulation: All assessment and education complete, DC therapy    Frequency     Barriers to discharge        Co-evaluation               End of Session Equipment Utilized During Treatment: Gait belt Activity Tolerance: Patient tolerated treatment well;No increased pain Patient left: in  bed;with call bell/phone within reach;with bed alarm set;with family/visitor present           Time: 1008-1020 PT Time Calculation (min) (ACUTE ONLY): 12 min   Charges:   PT Evaluation $PT Eval Low Complexity: 1 Procedure     PT G Codes:        Kobie Matkins C 08-23-2015, 10:41 AM  10:44 AM  Rosamaria Lints, PT, DPT [place] License # [id number] [phone]

## 2015-08-12 NOTE — Evaluation (Signed)
Clinical/Bedside Swallow Evaluation Patient Details  Name: Jose Schaefer MRN: [id number] Date of Birth: 19-Jan-1951  Today's Date: 08/12/2015 Time: SLP Start Time (ACUTE ONLY): 1345 SLP Stop Time (ACUTE ONLY): 1422 SLP Time Calculation (min) (ACUTE ONLY): 37 min  Past Medical History:  Past Medical History  Diagnosis Date  . Asthma   . Tobacco abuse   . CVA (cerebral vascular accident) [facility]) 2001    [facility]   Past Surgical History: History reviewed. No pertinent past surgical history. HPI:  65yo black male who was found by daughter to be dysarthric with facial droop upon waking, brought in to [facility] by family. Pt admitted for CVA workup. PMH: CVA (10ya), ARF, and positive UDS for cocaine. MRI shows: Acute small nonhemorrhagic mid to posterior left frontal lobe infarct. Remote infarcts of the basal ganglia and thalamus bilaterally. Remote pontine infarct. Remote small infarcts centrum semi ovale greater on the right. Remote left periatrial infarct. Moderate to marked chronic microvascular changes. Global moderate atrophy   Assessment / Plan / Recommendation Clinical Impression  Mr. Jose Schaefer presents with mod/severe oral phase dysphagia with suspected mild pharyngeal phase dysphagia in setting of acute CVA characterized by decreased labial closure and reduced lingual movement resulting in severe difficulties with bolus control, manipulation, and anterior posterior transit. Suspect mild delay in swallow initiation which at times is moderately delayed. Pt with significant lingual and oral residue which he has poor awareness of. He benefits from cue to alternate with sips of liquid and verbal cue to "move tongue" to clear buccal residue. These oral deficits also significantly impact speech intelligibility and pt presents with severe dysarthria which improves to mod/severe with verbal cues to exaggerate tongue movements. Pt will need follow up SLP services via homehealth since PT/OT have signed off. Recommend  D1/puree with Honey thick liquids with use of strategies. SLP will follow pt while in acute setting.     Aspiration Risk  Moderate aspiration risk;Risk for inadequate nutrition/hydration    Diet Recommendation Dysphagia 1 (Puree);Honey-thick liquid   Liquid Administration via: Cup Medication Administration: Crushed with puree Supervision: Full supervision/cueing for compensatory strategies Compensations: Slow rate;Small sips/bites;Lingual sweep for clearance of pocketing;Monitor for anterior loss;Follow solids with liquid Postural Changes: Seated upright at 90 degrees;Remain upright for at least 30 minutes after po intake    Other  Recommendations Oral Care Recommendations: Oral care before and after PO;Staff/trained caregiver to provide oral care Other Recommendations: Order thickener from pharmacy;Prohibited food (jello, ice cream, thin soups);Clarify dietary restrictions   Follow up Recommendations  Home health SLP    Frequency and Duration min 2x/week  1 week       Prognosis Prognosis for Safe Diet Advancement: Fair Barriers to Reach Goals: Behavior      Swallow Study   General Date of Onset: 08/11/15 HPI: 65yo black male who was found by daughter to be dysarthric with facial droop upon waking, brought in to [facility] by family. Pt admitted for CVA workup. PMH: CVA (10ya), ARF, and positive UDS for cocaine. MRI shows: Acute small nonhemorrhagic mid to posterior left frontal lobe infarct. Remote infarcts of the basal ganglia and thalamus bilaterally. Remote pontine infarct. Remote small infarcts centrum semi ovale greater on the right. Remote left periatrial infarct. Moderate to marked chronic microvascular changes. Global moderate atrophy Type of Study: Bedside Swallow Evaluation Diet Prior to this Study: NPO Temperature Spikes Noted: No Respiratory Status: Room air History of Recent Intubation: No Behavior/Cognition: Alert;Cooperative;Pleasant mood Oral Cavity Assessment:  Excessive secretions Oral Care Completed by  SLP: Yes Oral Cavity - Dentition: Edentulous Vision: Functional for self-feeding Self-Feeding Abilities: Able to feed self Patient Positioning: Upright in bed Baseline Vocal Quality: Normal Volitional Cough: Strong Volitional Swallow: Able to elicit    Oral/Motor/Sensory Function Overall Oral Motor/Sensory Function: Severe impairment Facial ROM: Reduced right;Suspected CN VII (facial) dysfunction Facial Symmetry: Within Functional Limits Facial Strength: Reduced right;Suspected CN VII (facial) dysfunction Facial Sensation: Within Functional Limits Lingual ROM: Suspected CN XII (hypoglossal) dysfunction;Reduced right;Reduced left Lingual Symmetry: Suspected CN XII (hypoglossal) dysfunction;Abnormal symmetry right Lingual Strength: Reduced;Suspected CN XII (hypoglossal) dysfunction Lingual Sensation: Reduced;Suspected CN VII (facial) dysfunction-anterior 2/3 tongue Velum: Within Functional Limits Mandible: Within Functional Limits   Ice Chips Ice chips: Impaired Presentation: Spoon Oral Phase Impairments: Reduced lingual movement/coordination Pharyngeal Phase Impairments: Suspected delayed Swallow   Thin Liquid Thin Liquid: Impaired Presentation: Cup;Spoon;Straw;Self Fed Oral Phase Impairments: Reduced labial seal;Reduced lingual movement/coordination Oral Phase Functional Implications: Right anterior spillage;Prolonged oral transit;Oral holding Pharyngeal  Phase Impairments: Suspected delayed Swallow;Multiple swallows    Nectar Thick Nectar Thick Liquid: Impaired Presentation: Cup;Self Fed Oral Phase Impairments: Reduced labial seal;Reduced lingual movement/coordination Oral phase functional implications: Right anterior spillage;Prolonged oral transit;Oral residue Pharyngeal Phase Impairments: Suspected delayed Swallow;Cough - Delayed   Honey Thick Honey Thick Liquid: Impaired Presentation: Cup;Self fed Oral Phase Impairments: Reduced  labial seal;Reduced lingual movement/coordination Oral Phase Functional Implications: Prolonged oral transit;Oral holding Pharyngeal Phase Impairments: Suspected delayed Swallow   Puree Puree: Impaired Presentation: Spoon Oral Phase Impairments: Reduced labial seal;Reduced lingual movement/coordination Oral Phase Functional Implications: Prolonged oral transit;Oral residue Pharyngeal Phase Impairments: Suspected delayed Swallow   Solid   Thank you,  Havery Moros, CCC-SLP [phone]    Solid: Not tested        Lyna Laningham 08/12/2015,2:32 PM

## 2015-08-12 NOTE — Progress Notes (Signed)
Neurocheck due, patient out of room for carotid ultrasound, will resume neurocheck on return to room.

## 2015-08-13 DIAGNOSIS — Z720 Tobacco use: Secondary | ICD-10-CM

## 2015-08-13 LAB — HEMOGLOBIN A1C
Hgb A1c MFr Bld: 5.7 % — ABNORMAL HIGH (ref 4.8–5.6)
MEAN PLASMA GLUCOSE: 117 mg/dL

## 2015-08-13 MED ORDER — RESOURCE THICKENUP CLEAR PO POWD: ORAL | Status: DC

## 2015-08-13 MED ORDER — ASPIRIN 81 MG PO TABS: 81.0000 mg | ORAL_TABLET | Freq: Every day | ORAL | Status: DC

## 2015-08-13 MED ORDER — ATORVASTATIN CALCIUM 40 MG PO TABS: 40.0000 mg | ORAL_TABLET | Freq: Every day | ORAL | Status: DC

## 2015-08-13 NOTE — Care Management Note (Signed)
Case Management Note  Patient Details  Name: Jose Schaefer MRN: [id number] Date of Birth: [birth year]  Subjective/Objective:                  Pt is from home, lives with his daughter and is ind with ADL's. Pt listed as not having insurance, FC has been referred, pt does have Medicare and will be enrolled in drug coverage. Pt reports not having a PCP and has no interest in going to see a MD. Pt given list of PCP's. Pt's daughter later came into room. Pt does have VA benefits and is affiliated with the [facility]. CM has made multiple attempts to make contact with VA but is unable prior to DC. After discussing DC plan with pt and daughter both are agreeable to make initial f/u appointment at the [facility] and pt's daughter will attempt to make contact with PCP in the area accepting new pt's to choose new PCP Pt will need HH SLP. Pt's daughter has chosen [facility] for [facility] services. Alroy Bailiff, of [facility]: Lambert Rhonda W, made aware of referral and will obtain pt info from chart. Pt's daughter understand HH has 48 hours to initiate services.   Action/Plan: No further CM needs.   Expected Discharge Date:      08/13/2015            Expected Discharge Plan:  Home w Home Health Services  In-House Referral:  Financial Counselor  Discharge planning Services  CM Consult, Follow-up appt scheduled, [facility]  Post Acute Care Choice:  Home Health Choice offered to:  Adult Children  DME Arranged:    DME Agency:     HH Arranged:  Speech Therapy HH Agency:  [facility]  Status of Service:  Completed, signed off  Medicare Important Message Given:    Date Medicare IM Given:    Medicare IM give by:    Date Additional Medicare IM Given:    Additional Medicare Important Message give by:     If discussed at Long Length of Stay Meetings, dates discussed:    Additional Comments:  Malcolm Metro, RN 08/13/2015, 1:55 PM

## 2015-08-13 NOTE — Progress Notes (Signed)
PROGRESS NOTE  Jose Schaefer ZOX:[id number] DOB: [birth year] DOA: 08/11/2015 PCP: No primary care provider on file.  Summary: 44 yom without past medical history presented to the ED with slurred speech, a facial droop, and drooling. While in the ED, CT scan showed age indeterminate lacunar infarct of the left thalamus. Creatinine was 1.39, rest of lab work is within normal limits, UDS positive for cocaine. He was admitted for further evaluation and management.  Assessment/Plan: 1. Acute nonhemorrhagic left frontal lobe infarct. Multiple remote old infarcts. Evaluated by physical and occupational therapy, has no follow-up needs.  Deficit limited to dysphagia and dysarthria. LDL 100. Carotid Dopplers and echocardiogram unremarkable. St recommends dysphagia 1 diet, honey thick liquids and HH ST.  2. Suspected chronic kidney disease stage III. 3. Cocaine abuse. Likely a major factor in acute stroke. 4. Tobacco use disorder. Counseled on the importance of cessation.   Overall improved. Discharge home today on aspirin, statin.  I recommended he establish with PCP of choice in the future, he has both Medicare and a connection to the [place]. This was discussed with his daughter as well. I also recommended follow-up with neurology and left a message with Dr. [name] office to arrange follow-up.  Code Status: Full DVT prophylaxis: Lovenox Family Communication: Family at bedside.  Disposition Plan: Home today.   Brendia Sacks, MD  [facility]  Pager [phone] If 7PM-7AM, please contact night-coverage at [email or web address], password [facility] 08/13/2015, 7:17 AM  LOS: 2 days   Consultants:  OT- no follow up   PT - no PT follow up   ST - Dysphagia 1 (Puree);Honey-thick liquid & HH ST  Procedures:  ECHO Study Conclusions  - Left ventricle: The cavity size was normal. Wall thickness was normal. Systolic function was normal. The estimated ejection fraction was in the range of 60% to 65%. Wall  motion was normal; there were no regional wall motion abnormalities. Doppler parameters are consistent with abnormal left ventricular relaxation (grade 1 diastolic dysfunction). - Aortic valve: Mildly calcified annulus. Trileaflet; normal thickness leaflets. There was mild to moderate regurgitation. Valve area (VTI): 2.14 cm^2. Valve area (Vmax): 2.04 cm^2.  Antibiotics:    HPI/Subjective: Feels good. Denies any pain,  SOB, nausea or vomiting. Has been eating well. Denies any weakness, numbness, or tingling.   Objective: Filed Vitals:   08/12/15 2058 08/12/15 2100 08/13/15 0100 08/13/15 0500  BP:  128/88 123/84 131/86  Pulse:  77 73 79  Temp:  98.2 F (36.8 C) 98.1 F (36.7 C) 98.7 F (37.1 C)  TempSrc:  Oral Oral Oral  Resp:  Height:      Weight:      SpO2: 92% 95% 94% 97%    Intake/Output Summary (Last 24 hours) at 08/13/15 0717 Last data filed at 08/12/15 1715  Gross per 24 hour  Intake      0 ml  Output    800 ml  Net   -800 ml     Filed Weights   08/11/15 0914 08/11/15 1630  Weight: 54.432 kg (120 lb) 51.71 kg (114 lb)    Exam:     Afebrile, not hypoxic General:  Appears calm and comfortable Eyes: PERRL, normal lids, irises & conjunctiva Cardiovascular: RRR, no m/r/g. No LE edema. Telemetry: SR, no arrhythmias  Respiratory: CTA bilaterally, no w/r/r. Normal respiratory effort. Musculoskeletal: grossly normal tone and strength BUE/BLE Psychiatric: grossly normal mood and affect Neurologic: no pronator drift, no pass pointing. Speech is somewhat dysarthric but intelligible. Tongue  deviates to the right   New data reviewed:  No new data.    Scheduled Meds: .  stroke: mapping our early stages of recovery book   Does not apply Once  . antiseptic oral rinse  7 mL Mouth Rinse BID  . aspirin  300 mg Rectal Daily   Or  . aspirin  325 mg Oral Daily  . atorvastatin  40 mg Oral q1800  . enoxaparin (LOVENOX) injection  40 mg  Subcutaneous Q24H   Continuous Infusions:   Principal Problem:   CVA (cerebral infarction) Active Problems:   Tobacco abuse   Cocaine abuse   ARF (acute renal failure) (HCC)   Cerebral infarction due to unspecified mechanism    By signing my name below, I, Burnett Harry attest that this documentation has been prepared under the direction and in the presence of Brendia Sacks, MD Electronically signed: Burnett Harry, Scribe.  08/13/2015 12:44pm   I personally performed the services described in this documentation. All medical record entries made by the scribe were at my direction. I have reviewed the chart and agree that the record reflects my personal performance and is accurate and complete. Brendia Sacks, MD

## 2015-08-13 NOTE — Care Management Important Message (Signed)
Important Message  Patient Details  Name: Jose Schaefer MRN: [id number] Date of Birth: January 23, 1951   Medicare Important Message Given:  N/A - LOS <3 / Initial given by admissions    Malcolm Metro, RN 08/13/2015, 2:08 PM

## 2015-08-13 NOTE — Progress Notes (Signed)
Discharge instructions reviewed with patient and family, questions answered, understanding verbalized.  Discharged via wheelchair accompanied by staff for discharge home in care of family and home health.  Stable at discharge.

## 2015-08-13 NOTE — Discharge Summary (Addendum)
Physician Discharge Summary  Jose Schaefer ZOX:[id number] DOB: 20-Nov-1950 DOA: 08/11/2015  PCP: [facility]  Admit date: 08/11/2015 Discharge date: 08/13/2015  Recommendations for Outpatient Follow-up:  1. Follow up stroke. 2. Home health speech therapy.   Follow-up Information    Follow up with [facility], KOFI, MD. Schedule an appointment as soon as possible for a visit in 2 months.   Specialty:  Neurology   Contact information:   [address] [name] [address] [phone]       Follow up with [facility].   Contact information:   [address] [place] [address] [phone]       Follow up with Jose Schaefer On 08/27/2015.   Why:  @ 145pm   Contact information:   [address] [place] [address] [phone]       Follow up with [facility].   Specialty:  General Practice   Why:  Continue calling to determine specific VA benefits.    Contact information:   [address] [name] [address] [phone]        Discharge Diagnoses:  1. Acute nonhemorrhagic left frontal lobe infarct.   2. Cocaine abuse.  3. Tobacco use disorder.   4. Suspected chronic kidney disease stage III.  Discharge Condition: Improved Disposition: Home  Diet recommendation: Dysphagia 1 - Honey-thick liquid  Filed Weights   08/11/15 0914 08/11/15 1630  Weight: 54.432 kg (120 lb) 51.71 kg (114 lb)    History of present illness:  65 yom without past medical history presented to the ED with slurred speech, a facial droop, and drooling. While in the ED, CT scan showed age indeterminate lacunar infarct of the left thalamus. UDS positive for cocaine.  Hospital Course:  Patient presented with slurred speech, a facial droop, and drooling. CT scan showed age indeterminate lacunar infarct of the left thalamus. MRI/MRA revealed an acute nonhemorrhagic left frontal lobe infarct and multiple remote old infarcts.  Patient was evaluated by PT and OT who did not recommend any follow up. ST consulted and recommended a dysphagia 1 diet with honey-thick liquids given patient is at moderate risk for aspiration. He will be discharged with home heath speech therapy.  LDL 100, carotid dopplers and ECHO were unremarkable. Patient's dysphagia and dysarthria are improving. He will be discharged on a statin and ASA.    Individual issues as below:  1. Acute nonhemorrhagic left frontal lobe infarct. Multiple remote old infarcts. Evaluated by physical and occupational therapy, has no follow-up needs. Deficit limited to dysphagia and dysarthria. LDL 100. Carotid Dopplers and echocardiogram unremarkable. St recommends dysphagia 1 diet, honey thick liquids and HH ST.  2. Suspected chronic kidney disease stage III. 3. Cocaine abuse. Likely a major factor in acute stroke. 4. Tobacco use disorder. Counseled on the importance of cessation. 5. Remote dens fracture; asymptomatic. D/w Dr. Sharlet Salina Ditty neurosurgery. Patient does not require any further evaluation.   I recommended he establish with PCP of choice in the future, he has both Medicare and a connection to the [place]. This was discussed with his daughter as well. I also recommended follow-up with neurology and left a message with Dr. [name] office to arrange follow-up.  Consultants: 6. OT- no follow up  7. PT - no PT follow up  8. ST - Dysphagia 1 (Puree);Honey-thick liquid. HH ST  Procedures:  ECHO Study Conclusions  - Left ventricle: The cavity size was normal. Wall thickness was normal.  Systolic function was normal. The estimated ejection fraction was in the range of 60% to 65%. Wall motion was normal; there were no regional wall motion abnormalities. Doppler parameters are consistent with abnormal left ventricular relaxation (grade 1 diastolic dysfunction). - Aortic valve: Mildly calcified annulus. Trileaflet; normal thickness leaflets.  There was mild to moderate regurgitation. Valve area (VTI): 2.14 cm^2. Valve area (Vmax): 2.04 cm^2.  Antibiotics:  None   Discharge Instructions Discharge Instructions    Activity as tolerated - No restrictions    Complete by:  As directed      Discharge instructions    Complete by:  As directed   Call your physician or seek immediate medical attention for weakness, numbness, difficulty speaking, swallowing or worsening of condition. Take nothing except as prescribed by your doctors. Please establish with primary care doctor of your choice and follow-up in 2 weeks. Please see Dr. [name] in 2 months.   Recommend puree diet, Honey-thick liquids (thicken all liquids). Ask your pharmacist what medications can be crushed. Those that can be crushed should be taken crushed. Recommend full supervision with meals. Eat slowly with small sips and bites. Follow solids with liquid. Sit fully upright when eating.   Other Recommendations Oral Care Recommendations: Oral care before and after PO;Staff/trained caregiver to provide oral care Prohibited food includes jello, ice cream, thin soups            Current Discharge Medication List    START taking these medications   Details  aspirin 81 MG tablet Take 1 tablet (81 mg total) by mouth daily.    atorvastatin (LIPITOR) 40 MG tablet Take 1 tablet (40 mg total) by mouth daily at 6 PM. Qty: 30 tablet, Refills: 0    Maltodextrin-Xanthan Gum (RESOURCE THICKENUP CLEAR) POWD Use to thicken liquids. Qty: 1 Can, Refills: 3       No Known Allergies  The results of significant diagnostics from this hospitalization (including imaging, microbiology, ancillary and laboratory) are listed below for reference.    Significant Diagnostic Studies: Ct Head Wo Contrast  08/11/2015  CLINICAL DATA:  Dysarthria, left facial droop EXAM: CT HEAD WITHOUT CONTRAST TECHNIQUE: Contiguous axial images were obtained from the base of the skull through the vertex  without intravenous contrast. COMPARISON:  09/12/2005 FINDINGS: There is no evidence of mass effect, midline shift or extra-axial fluid collections. There is no evidence of a space-occupying lesion or intracranial hemorrhage. There is no evidence of a cortical-based area of acute infarction. There is periventricular white matter low attenuation. There is a age-indeterminate lacunar infarct of the left thalamus. The ventricles and sulci are appropriate for the patient's age. The basal cisterns are patent. Visualized portions of the orbits are unremarkable. Mild bilateral ethmoid sinus mucosal thickening. Left sphenoid sinus mucosal thickening. Right maxillary sinus mucosal thickening. The osseous structures are unremarkable. IMPRESSION: 1. Age-indeterminate lacunar infarct of the left thalamus. Electronically Signed   By: Elige Ko   On: 08/11/2015 10:54   Mr Maxine Glenn Head Wo Contrast  08/11/2015  ADDENDUM REPORT: 08/11/2015 20:31 ADDENDUM: Scattered tiny blood breakdown products left caudate head and lenticular nucleus, right thalamus and posterior right temporal -occipital lobe most consistent with result prior hemorrhagic ischemia. Electronically Signed   By: Lacy Duverney M.D.   On: 08/11/2015 20:31  08/11/2015  CLINICAL DATA:  65 year old male presenting with left-sided facial droop and difficulty with speech. Subsequent encounter. EXAM: MRI HEAD WITHOUT CONTRAST MRA HEAD WITHOUT CONTRAST TECHNIQUE: Multiplanar, multiecho pulse sequences of the brain and  surrounding structures were obtained without intravenous contrast. Angiographic images of the head were obtained using MRA technique without contrast. COMPARISON:  08/11/2015 head CT.  No comparison brain MR. FINDINGS: MRI HEAD FINDINGS Acute small nonhemorrhagic mid to posterior left frontal lobe infarct. Remote infarcts of the basal ganglia and thalamus bilaterally. Remote pontine infarct. Remote small infarcts centrum semi ovale greater on the right. Remote  left periatrial infarct. Moderate to marked white matter changes confluent periventricular region most consistent with result of chronic microvascular changes given the surrounding findings. Global moderate atrophy without hydrocephalus. No intracranial mass lesion noted on this unenhanced exam. Mucosal thickening/ partial opacification paranasal sinuses most notable right maxillary sinus (which appears small in size) and left sphenoid sinus. Remote fracture base of dens with incomplete healing and adjacent transverse ligament hypertrophy. If further delineation is clinically desired, on cervical spine CT can be obtained. This appears new compared to 2007 cervical spine CT. Small pituitary gland. No orbital or pineal abnormality noted. MRA HEAD FINDINGS Mild to moderate narrowing pre cavernous segment right internal carotid artery. Mild narrowing supraclinoid segment internal carotid artery bilaterally. Internal carotid arteries are ectatic. No significant stenosis M1 segment of either middle cerebral artery. Moderate to marked narrowing of M2/ M3 branches of middle cerebral artery bilaterally. Moderate narrowing of congenitally small A1 segment right anterior cerebral artery. Mild to moderate narrowing portions of the A2 segment of the anterior cerebral artery bilaterally. Moderate to marked tandem stenosis left vertebral artery. Mild to moderate tenderness stenosis right vertebral artery. Nonvisualized anterior inferior cerebellar arteries, posterior inferior cerebellar arteries and superior cerebellar arteries. Ectatic basilar artery with mild irregularity without high-grade stenosis. Moderate to marked narrowing proximal right posterior cerebral artery. Nonvisualized posterior cerebral artery distal branches bilaterally. No aneurysm noted. IMPRESSION: MRI HEAD Acute small nonhemorrhagic mid to posterior left frontal lobe infarct. Remote infarcts of the basal ganglia and thalamus bilaterally. Remote pontine  infarct. Remote small infarcts centrum semi ovale greater on the right. Remote left periatrial infarct. Moderate to marked chronic microvascular changes. Global moderate atrophy. Remote fracture base of dens with incomplete healing and adjacent transverse ligament hypertrophy. If further delineation is clinically desired, on cervical spine CT can be obtained. This appears new compared to 2007 cervical spine CT. Mucosal thickening/ partial opacification paranasal sinuses most notable right maxillary sinus (which appears small in size) and left sphenoid sinus. MRA HEAD Significant intracranial atherosclerotic type changes as detailed above. Electronically Signed: By: Lacy Duverney M.D. On: 08/11/2015 20:06   Mr Brain Wo Contrast  08/11/2015  ADDENDUM REPORT: 08/11/2015 20:31 ADDENDUM: Scattered tiny blood breakdown products left caudate head and lenticular nucleus, right thalamus and posterior right temporal -occipital lobe most consistent with result prior hemorrhagic ischemia. Electronically Signed   By: Lacy Duverney M.D.   On: 08/11/2015 20:31  08/11/2015  CLINICAL DATA:  65 year old male presenting with left-sided facial droop and difficulty with speech. Subsequent encounter. EXAM: MRI HEAD WITHOUT CONTRAST MRA HEAD WITHOUT CONTRAST TECHNIQUE: Multiplanar, multiecho pulse sequences of the brain and surrounding structures were obtained without intravenous contrast. Angiographic images of the head were obtained using MRA technique without contrast. COMPARISON:  08/11/2015 head CT.  No comparison brain MR. FINDINGS: MRI HEAD FINDINGS Acute small nonhemorrhagic mid to posterior left frontal lobe infarct. Remote infarcts of the basal ganglia and thalamus bilaterally. Remote pontine infarct. Remote small infarcts centrum semi ovale greater on the right. Remote left periatrial infarct. Moderate to marked white matter changes confluent periventricular region most consistent with result of  chronic microvascular  changes given the surrounding findings. Global moderate atrophy without hydrocephalus. No intracranial mass lesion noted on this unenhanced exam. Mucosal thickening/ partial opacification paranasal sinuses most notable right maxillary sinus (which appears small in size) and left sphenoid sinus. Remote fracture base of dens with incomplete healing and adjacent transverse ligament hypertrophy. If further delineation is clinically desired, on cervical spine CT can be obtained. This appears new compared to 2007 cervical spine CT. Small pituitary gland. No orbital or pineal abnormality noted. MRA HEAD FINDINGS Mild to moderate narrowing pre cavernous segment right internal carotid artery. Mild narrowing supraclinoid segment internal carotid artery bilaterally. Internal carotid arteries are ectatic. No significant stenosis M1 segment of either middle cerebral artery. Moderate to marked narrowing of M2/ M3 branches of middle cerebral artery bilaterally. Moderate narrowing of congenitally small A1 segment right anterior cerebral artery. Mild to moderate narrowing portions of the A2 segment of the anterior cerebral artery bilaterally. Moderate to marked tandem stenosis left vertebral artery. Mild to moderate tenderness stenosis right vertebral artery. Nonvisualized anterior inferior cerebellar arteries, posterior inferior cerebellar arteries and superior cerebellar arteries. Ectatic basilar artery with mild irregularity without high-grade stenosis. Moderate to marked narrowing proximal right posterior cerebral artery. Nonvisualized posterior cerebral artery distal branches bilaterally. No aneurysm noted. IMPRESSION: MRI HEAD Acute small nonhemorrhagic mid to posterior left frontal lobe infarct. Remote infarcts of the basal ganglia and thalamus bilaterally. Remote pontine infarct. Remote small infarcts centrum semi ovale greater on the right. Remote left periatrial infarct. Moderate to marked chronic microvascular changes.  Global moderate atrophy. Remote fracture base of dens with incomplete healing and adjacent transverse ligament hypertrophy. If further delineation is clinically desired, on cervical spine CT can be obtained. This appears new compared to 2007 cervical spine CT. Mucosal thickening/ partial opacification paranasal sinuses most notable right maxillary sinus (which appears small in size) and left sphenoid sinus. MRA HEAD Significant intracranial atherosclerotic type changes as detailed above. Electronically Signed: By: Lacy Duverney M.D. On: 08/11/2015 20:06   US Carotid Bilateral  08/12/2015  CLINICAL DATA:  Small acute nonhemorrhagic left frontal infarct. EXAM: BILATERAL CAROTID DUPLEX ULTRASOUND TECHNIQUE: Wallace Cullens scale imaging, color Doppler and duplex ultrasound were performed of bilateral carotid and vertebral arteries in the neck. COMPARISON:  08/11/2015 FINDINGS: Criteria: Quantification of carotid stenosis is based on velocity parameters that correlate the residual internal carotid diameter with NASCET-based stenosis levels, using the diameter of the distal internal carotid lumen as the denominator for stenosis measurement. The following velocity measurements were obtained: RIGHT ICA:  58/22 cm/sec CCA:  58/16 cm/sec SYSTOLIC ICA/CCA RATIO:  1.0 DIASTOLIC ICA/CCA RATIO:  1.4 ECA:  57 cm/sec LEFT ICA:  54/24 cm/sec CCA:  57/18 cm/sec SYSTOLIC ICA/CCA RATIO:  1.0 DIASTOLIC ICA/CCA RATIO:  1.3 ECA:  44 cm/sec RIGHT CAROTID ARTERY: Minor intimal thickening and early mild atherosclerotic plaque formation. No hemodynamically significant right ICA stenosis, velocity elevation, or turbulent flow. Degree of narrowing less than 50%. RIGHT VERTEBRAL ARTERY:  Antegrade LEFT CAROTID ARTERY: Similar scattered minor intimal thickening and early mild atherosclerotic plaque formation. No hemodynamically significant left ICA stenosis, velocity elevation, or turbulent flow. LEFT VERTEBRAL ARTERY:  Antegrade IMPRESSION: Mild  carotid atherosclerosis. No hemodynamically significant ICA stenosis. Degree of narrowing less than 50% bilaterally. Patent antegrade vertebral flow bilaterally. Electronically Signed   By: Judie Petit.  Shick M.D.   On: 08/12/2015 09:08      Labs: Basic Metabolic Panel:  Recent Labs Lab 08/11/15 0942 08/11/15 0950  NA 144 144  K  4.3 4.2  CL 109 106  CO2 29  --   GLUCOSE 95 91  BUN 13 12  CREATININE 1.39* 1.40*  CALCIUM 9.0  --    Liver Function Tests:  Recent Labs Lab 08/11/15 0942  AST 12*  ALT 10*  ALKPHOS 56  BILITOT 0.7  PROT 6.9  ALBUMIN 3.5    CBC:  Recent Labs Lab 08/11/15 0942 08/11/15 0950  WBC 4.7  --   NEUTROABS 2.7  --   HGB 13.2 15.0  HCT 40.0 44.0  MCV 85.8  --   PLT 213  --       Principal Problem:   CVA (cerebral infarction) Active Problems:   Tobacco abuse   Cocaine abuse   ARF (acute renal failure) (HCC)   Cerebral infarction due to unspecified mechanism   Time coordinating discharge: 35 minutes  Signed:  Brendia Sacks, MD [facility] 08/13/2015, 7:20 AM  By signing my name below, I, Burnett Harry attest that this documentation has been prepared under the direction and in the presence of Brendia Sacks, MD Electronically signed: Burnett Harry, Scribe.  08/13/2015  I personally performed the services described in this documentation. All medical record entries made by the scribe were at my direction. I have reviewed the chart and agree that the record reflects my personal performance and is accurate and complete. Brendia Sacks, MD

## 2017-01-04 ENCOUNTER — Emergency Department (HOSPITAL_COMMUNITY): Payer: Medicare Other

## 2017-01-04 ENCOUNTER — Encounter (HOSPITAL_COMMUNITY): Payer: Self-pay | Admitting: Emergency Medicine

## 2017-01-04 ENCOUNTER — Emergency Department (HOSPITAL_COMMUNITY)
Admission: EM | Admit: 2017-01-04 | Discharge: 2017-01-04 | Disposition: A | Discharge status: Home / Self Care | Payer: Medicare Other | Attending: Emergency Medicine | Admitting: Emergency Medicine

## 2017-01-04 DIAGNOSIS — F17210 Nicotine dependence, cigarettes, uncomplicated: Secondary | ICD-10-CM | POA: Insufficient documentation

## 2017-01-04 DIAGNOSIS — J441 Chronic obstructive pulmonary disease with (acute) exacerbation: Secondary | ICD-10-CM | POA: Diagnosis not present

## 2017-01-04 DIAGNOSIS — Z7982 Long term (current) use of aspirin: Secondary | ICD-10-CM | POA: Diagnosis not present

## 2017-01-04 DIAGNOSIS — I10 Essential (primary) hypertension: Secondary | ICD-10-CM | POA: Diagnosis not present

## 2017-01-04 DIAGNOSIS — J45909 Unspecified asthma, uncomplicated: Secondary | ICD-10-CM | POA: Insufficient documentation

## 2017-01-04 DIAGNOSIS — R0602 Shortness of breath: Secondary | ICD-10-CM | POA: Diagnosis present

## 2017-01-04 LAB — COMPREHENSIVE METABOLIC PANEL
ALBUMIN: 3.7 g/dL (ref 3.5–5.0)
ALK PHOS: 59 U/L (ref 38–126)
ALT: 11 U/L — AB (ref 17–63)
AST: 14 U/L — ABNORMAL LOW (ref 15–41)
Anion gap: 5 (ref 5–15)
BILIRUBIN TOTAL: 0.5 mg/dL (ref 0.3–1.2)
BUN: 9 mg/dL (ref 6–20)
CALCIUM: 8.7 mg/dL — AB (ref 8.9–10.3)
CO2: 27 mmol/L (ref 22–32)
CREATININE: 1.1 mg/dL (ref 0.61–1.24)
Chloride: 108 mmol/L (ref 101–111)
GFR calc non Af Amer: >60 mL/min (ref >60)
GLUCOSE: 164 mg/dL — AB (ref 65–99)
Potassium: 3.6 mmol/L (ref 3.5–5.1)
SODIUM: 140 mmol/L (ref 135–145)
TOTAL PROTEIN: 6.9 g/dL (ref 6.5–8.1)

## 2017-01-04 LAB — CBC WITH DIFFERENTIAL/PLATELET
Basophils Absolute: 0.1 10*3/uL (ref 0.0–0.1)
Basophils Relative: 2 %
EOS ABS: 0.5 10*3/uL (ref 0.0–0.7)
Eosinophils Relative: 9 %
HCT: 41 % (ref 39.0–52.0)
HEMOGLOBIN: 13.8 g/dL (ref 13.0–17.0)
LYMPHS ABS: 1.6 10*3/uL (ref 0.7–4.0)
Lymphocytes Relative: 28 %
MCH: 29.4 pg (ref 26.0–34.0)
MCHC: 33.7 g/dL (ref 30.0–36.0)
MCV: 87.2 fL (ref 78.0–100.0)
MONOS PCT: 8 %
Monocytes Absolute: 0.5 10*3/uL (ref 0.1–1.0)
NEUTROS ABS: 3 10*3/uL (ref 1.7–7.7)
NEUTROS PCT: 53 %
Platelets: 186 10*3/uL (ref 150–400)
RBC: 4.7 MIL/uL (ref 4.22–5.81)
RDW: 13.7 % (ref 11.5–15.5)
WBC: 5.6 10*3/uL (ref 4.0–10.5)

## 2017-01-04 LAB — BLOOD GAS, ARTERIAL
Acid-Base Excess: 1.6 mmol/L (ref 0.0–2.0)
Bicarbonate: 25.3 mmol/L (ref 20.0–28.0)
Drawn by: 22223
O2 Content: 4 L/min
O2 Saturation: 99 %
PCO2 ART: 48.2 mmHg — AB (ref 32.0–48.0)
PH ART: 7.36 (ref 7.350–7.450)
Patient temperature: 37
pO2, Arterial: 175 mmHg — ABNORMAL HIGH (ref 83.0–108.0)

## 2017-01-04 LAB — I-STAT TROPONIN, ED: Troponin i, poc: 0 ng/mL (ref 0.00–0.08)

## 2017-01-04 LAB — ETHANOL: Alcohol, Ethyl (B): <5 mg/dL (ref <5)

## 2017-01-04 LAB — BRAIN NATRIURETIC PEPTIDE: B Natriuretic Peptide: 71 pg/mL (ref 0.0–100.0)

## 2017-01-04 MED ORDER — ALBUTEROL (5 MG/ML) CONTINUOUS INHALATION SOLN
INHALATION_SOLUTION | RESPIRATORY_TRACT | Status: AC
  Administered 2017-01-04: 06:00:00
  Filled 2017-01-04: qty 20

## 2017-01-04 MED ORDER — LISINOPRIL 10 MG PO TABS: 10.0000 mg | ORAL_TABLET | Freq: Every day | ORAL | 0 refills | Status: DC

## 2017-01-04 MED ORDER — DOXYCYCLINE HYCLATE 100 MG PO CAPS: 100.0000 mg | ORAL_CAPSULE | Freq: Two times a day (BID) | ORAL | 0 refills | Status: DC

## 2017-01-04 MED ORDER — IPRATROPIUM BROMIDE 0.02 % IN SOLN
RESPIRATORY_TRACT | Status: AC
  Administered 2017-01-04: 0.5 mg
  Filled 2017-01-04: qty 5

## 2017-01-04 MED ORDER — METHYLPREDNISOLONE SODIUM SUCC 125 MG IJ SOLR
125.0000 mg | Freq: Once | INTRAMUSCULAR | Status: AC
  Administered 2017-01-04: 125 mg via INTRAVENOUS
  Filled 2017-01-04: qty 2

## 2017-01-04 MED ORDER — ALBUTEROL SULFATE HFA 108 (90 BASE) MCG/ACT IN AERS
2.0000 | INHALATION_SPRAY | RESPIRATORY_TRACT | Status: DC | PRN
  Administered 2017-01-04: 2 via RESPIRATORY_TRACT
  Filled 2017-01-04: qty 6.7

## 2017-01-04 MED ORDER — ALBUTEROL SULFATE HFA 108 (90 BASE) MCG/ACT IN AERS: 2.0000 | INHALATION_SPRAY | RESPIRATORY_TRACT | 2 refills | Status: DC | PRN

## 2017-01-04 MED ORDER — MAGNESIUM SULFATE 2 GM/50ML IV SOLN
2.0000 g | Freq: Once | INTRAVENOUS | Status: AC
  Administered 2017-01-04: 2 g via INTRAVENOUS
  Filled 2017-01-04: qty 50

## 2017-01-04 MED ORDER — PREDNISONE 20 MG PO TABS: 60.0000 mg | ORAL_TABLET | Freq: Every day | ORAL | 0 refills | Status: DC

## 2017-01-04 NOTE — ED Provider Notes (Signed)
[facility]-EMERGENCY DEPT Provider Note   CSN: [id number] Arrival date & time: 01/04/17  0533     History   Chief Complaint Chief Complaint  Patient presents with  . Shortness of Breath    HPI Jose Schaefer is a 66 y.o. male.  Patient presents to the emergency department for evaluation of shortness of breath. Patient is unable to speak in full sentences at arrival. He is accompanied by his daughter who tells me that he has been having trouble breathing for months. Patient refuses to go to the doctor. He is not taking any of his medications including no bronchodilator therapy.  Patient denies chest pain. He does report cough, nonproductive. No fever.      Past Medical History:  Diagnosis Date  . Asthma   . CVA (cerebral vascular accident) [facility]) 2001   [place]  . Tobacco abuse     Patient Active Problem List   Diagnosis Date Noted  . Cerebral infarction due to unspecified mechanism   . CVA (cerebral infarction) 08/11/2015  . Cocaine abuse 08/11/2015  . ARF (acute renal failure) (HCC) 08/11/2015  . Acute respiratory failure (HCC) 06/26/2011  . COPD (chronic obstructive pulmonary disease) with acute bronchitis (HCC) 06/26/2011  . Tobacco abuse   . Asthma     History reviewed. No pertinent surgical history.     Home Medications    Prior to Admission medications   Medication Sig Start Date End Date Taking? Authorizing Provider  aspirin 81 MG tablet Take 1 tablet (81 mg total) by mouth daily. 08/13/15   Standley Brooking, MD  atorvastatin (LIPITOR) 40 MG tablet Take 1 tablet (40 mg total) by mouth daily at 6 PM. 08/13/15   Standley Brooking, MD  Maltodextrin-Xanthan Gum (RESOURCE THICKENUP CLEAR) POWD Use to thicken liquids. 08/13/15   Standley Brooking, MD    Family History No family history on file.  Social History Social History  Substance Use Topics  . Smoking status: Current Every Day Smoker    Packs/day: 0.50    Years: 40.00    Types: Cigarettes  .  Smokeless tobacco: Never Used  . Alcohol use 8.4 oz/week    4 Cans of beer, 10 Shots of liquor per week     Comment: as much as i can get     Allergies   Patient has no known allergies.   Review of Systems Review of Systems  Respiratory: Positive for cough and shortness of breath.   All other systems reviewed and are negative.    Physical Exam Updated Vital Signs BP (!) 158/96 (BP Location: Left Arm)   Pulse 62   Temp 97.7 F (36.5 C) (Axillary)   Resp (!) 22   Wt 49.9 kg (110 lb)   SpO2 100%   BMI 16.73 kg/m   Physical Exam  Constitutional: He is oriented to person, place, and time. He appears well-developed and well-nourished. He appears distressed.  HENT:  Head: Normocephalic and atraumatic.  Right Ear: Hearing normal.  Left Ear: Hearing normal.  Nose: Nose normal.  Mouth/Throat: Oropharynx is clear and moist and mucous membranes are normal.  Eyes: Conjunctivae and EOM are normal. Pupils are equal, round, and reactive to light.  Neck: Normal range of motion. Neck supple.  Cardiovascular: Regular rhythm, S1 normal and S2 normal.  Exam reveals no gallop and no friction rub.   No murmur heard. Pulmonary/Chest: Accessory muscle usage present. Tachypnea noted. No respiratory distress. He has wheezes. He exhibits no tenderness.  Diffuse, audible  inspiratory and expiratory wheezing  Abdominal: Soft. Normal appearance and bowel sounds are normal. There is no hepatosplenomegaly. There is no tenderness. There is no rebound, no guarding, no tenderness at McBurney's point and negative Murphy's sign. No hernia.  Musculoskeletal: Normal range of motion.  Neurological: He is alert and oriented to person, place, and time. He has normal strength. No cranial nerve deficit or sensory deficit. Coordination normal. GCS eye subscore is 4. GCS verbal subscore is 5. GCS motor subscore is 6.  Skin: Skin is warm, dry and intact. No rash noted. No cyanosis.  Psychiatric: He has a normal mood  and affect. His speech is normal and behavior is normal. Thought content normal.  Nursing note and vitals reviewed.    ED Treatments / Results  Labs (all labs ordered are listed, but only abnormal results are displayed) Labs Reviewed  COMPREHENSIVE METABOLIC PANEL - Abnormal; Notable for the following:       Result Value   Glucose, Bld 164 (*)    Calcium 8.7 (*)    AST 14 (*)    ALT 11 (*)    All other components within normal limits  BLOOD GAS, ARTERIAL - Abnormal; Notable for the following:    pCO2 arterial 48.2 (*)    pO2, Arterial 175.0 (*)    All other components within normal limits  CBC WITH DIFFERENTIAL/PLATELET  ETHANOL  BRAIN NATRIURETIC PEPTIDE  RAPID URINE DRUG SCREEN, HOSP PERFORMED  I-STAT TROPOININ, ED    EKG  EKG Interpretation  Date/Time:  Wednesday January 04 2017 05:46:42 EDT Ventricular Rate:  65 PR Interval:    QRS Duration: 83 QT Interval:  415 QTC Calculation: 432 R Axis:   66 Text Interpretation:  Sinus rhythm Ventricular premature complex Consider left atrial enlargement Anteroseptal infarct, old Confirmed by Gilda Creaseollina, Christopher J [phone]([id number]) on 01/04/2017 6:24:32 AM       Radiology Dg Chest Port 1 View  Result Date: 01/04/2017 CLINICAL DATA:  Shortness of breath.  History of tobacco abuse. EXAM: PORTABLE CHEST 1 VIEW COMPARISON:  Chest radiograph June 26, 2011 FINDINGS: Cardiomediastinal silhouette is normal, faintly calcified aortic arch. Mild chronic interstitial changes increased lung volumes without pleural effusion or focal consolidation. LEFT apical bullous changes. No pneumothorax. Soft tissue planes and included osseous structures are nonsuspicious. IMPRESSION: COPD. Electronically Signed   By: Awilda Metroourtnay  Bloomer M.D.   On: 01/04/2017 05:58    Procedures Procedures (including critical care time)  Medications Ordered in ED Medications  magnesium sulfate IVPB 2 g 50 mL (2 g Intravenous New Bag/Given 01/04/17 0547)  ipratropium (ATROVENT)  0.02 % nebulizer solution (0.5 mg  Given 01/04/17 0543)  albuterol (PROVENTIL, VENTOLIN) (5 MG/ML) 0.5% continuous inhalation solution (  Given 01/04/17 0543)  methylPREDNISolone sodium succinate (SOLU-MEDROL) 125 mg/2 mL injection 125 mg (125 mg Intravenous Given 01/04/17 0547)     Initial Impression / Assessment and Plan / ED Course  I have reviewed the triage vital signs and the nursing notes.  Pertinent labs & imaging results that were available during my care of the patient were reviewed by me and considered in my medical decision making (see chart for details).     Patient presents to the emergency department for evaluation of difficulty breathing. Patient appeared to be in moderate respiratory distress at arrival. He has a history of asthma/COPD that has been untreated for some time. He does not currently have a primary care doctor and essentially refuses to go to the doctor. He was hypertensive. This is  a combination of noncompliance with hypertensive medications and his breathing difficulty currently. Blood pressure significantly improved with respiratory treatment. He is not expressing any chest pain. EKG no obvious ischemia or infarct. Troponin negative.  Blood gas showed minimal CO2 retention and no hypoxia. Chest x-ray does not show pneumonia or other acute pathology, consistent with COPD. He is afebrile, white blood cell count normal at 5.6. Remainder of blood work was unremarkable.  Patient to minister to Solu-Medrol, magnesium, continuous albuterol nebulizer therapy. He has had significant improvement. Oxygenation is excellent. Air movement is very good. Patient will be treated as an outpatient with prednisone, doxycycline, albuterol. Will need follow-up with primary care.  Final Clinical Impressions(s) / ED Diagnoses   Final diagnoses:  COPD exacerbation [facility])  Essential hypertension    New Prescriptions New Prescriptions   No medications on file     Gilda Crease, MD 01/04/17 [phone]

## 2017-01-04 NOTE — ED Triage Notes (Signed)
Pt c/o sob all night.

## 2017-01-04 NOTE — ED Notes (Signed)
Pt made aware to return if symptoms worsen or if any life threatening symptoms occur.   

## 2017-01-04 NOTE — ED Notes (Signed)
Patient states that he is feeling better.

## 2017-08-26 ENCOUNTER — Emergency Department (HOSPITAL_COMMUNITY): Payer: Medicare Other

## 2017-08-26 ENCOUNTER — Other Ambulatory Visit: Payer: Self-pay

## 2017-08-26 ENCOUNTER — Emergency Department (HOSPITAL_COMMUNITY)
Admission: EM | Admit: 2017-08-26 | Discharge: 2017-08-27 | Disposition: A | Discharge status: Home / Self Care | Payer: Medicare Other | Attending: Emergency Medicine | Admitting: Emergency Medicine

## 2017-08-26 ENCOUNTER — Encounter (HOSPITAL_COMMUNITY): Payer: Self-pay | Admitting: *Deleted

## 2017-08-26 DIAGNOSIS — J45909 Unspecified asthma, uncomplicated: Secondary | ICD-10-CM | POA: Insufficient documentation

## 2017-08-26 DIAGNOSIS — J441 Chronic obstructive pulmonary disease with (acute) exacerbation: Secondary | ICD-10-CM | POA: Insufficient documentation

## 2017-08-26 DIAGNOSIS — R0602 Shortness of breath: Secondary | ICD-10-CM | POA: Diagnosis present

## 2017-08-26 DIAGNOSIS — F17210 Nicotine dependence, cigarettes, uncomplicated: Secondary | ICD-10-CM | POA: Diagnosis not present

## 2017-08-26 HISTORY — DX: Chronic obstructive pulmonary disease, unspecified: J44.9

## 2017-08-26 LAB — CBC WITH DIFFERENTIAL/PLATELET
BASOS PCT: 2 %
Basophils Absolute: 0.1 10*3/uL (ref 0.0–0.1)
EOS PCT: 13 %
Eosinophils Absolute: 0.7 10*3/uL (ref 0.0–0.7)
HCT: 44.1 % (ref 39.0–52.0)
Hemoglobin: 14.4 g/dL (ref 13.0–17.0)
Lymphocytes Relative: 36 %
Lymphs Abs: 1.8 10*3/uL (ref 0.7–4.0)
MCH: 28.6 pg (ref 26.0–34.0)
MCHC: 32.7 g/dL (ref 30.0–36.0)
MCV: 87.7 fL (ref 78.0–100.0)
MONO ABS: 0.3 10*3/uL (ref 0.1–1.0)
MONOS PCT: 6 %
Neutro Abs: 2.2 10*3/uL (ref 1.7–7.7)
Neutrophils Relative %: 43 %
Platelets: 210 10*3/uL (ref 150–400)
RBC: 5.03 MIL/uL (ref 4.22–5.81)
RDW: 13.9 % (ref 11.5–15.5)
WBC: 5.1 10*3/uL (ref 4.0–10.5)

## 2017-08-26 LAB — BASIC METABOLIC PANEL
ANION GAP: 10 (ref 5–15)
BUN: 14 mg/dL (ref 6–20)
CALCIUM: 9.7 mg/dL (ref 8.9–10.3)
CO2: 30 mmol/L (ref 22–32)
Chloride: 105 mmol/L (ref 101–111)
Creatinine, Ser: 1.16 mg/dL (ref 0.61–1.24)
GFR calc Af Amer: >60 mL/min (ref >60)
GLUCOSE: 100 mg/dL — AB (ref 65–99)
Potassium: 3.6 mmol/L (ref 3.5–5.1)
Sodium: 145 mmol/L (ref 135–145)

## 2017-08-26 LAB — TROPONIN I: Troponin I: <0.03 ng/mL (ref <0.03)

## 2017-08-26 MED ORDER — ALBUTEROL SULFATE (2.5 MG/3ML) 0.083% IN NEBU
INHALATION_SOLUTION | RESPIRATORY_TRACT | Status: AC
  Administered 2017-08-26: 5 mg
  Filled 2017-08-26: qty 6

## 2017-08-26 MED ORDER — PREDNISONE 20 MG PO TABS: 40.0000 mg | ORAL_TABLET | Freq: Every day | ORAL | 0 refills | Status: DC

## 2017-08-26 MED ORDER — IPRATROPIUM BROMIDE 0.02 % IN SOLN
RESPIRATORY_TRACT | Status: AC
  Administered 2017-08-26: 0.5 mg
  Filled 2017-08-26: qty 2.5

## 2017-08-26 MED ORDER — ALBUTEROL SULFATE HFA 108 (90 BASE) MCG/ACT IN AERS
2.0000 | INHALATION_SPRAY | RESPIRATORY_TRACT | Status: DC | PRN
  Administered 2017-08-26: 2 via RESPIRATORY_TRACT
  Filled 2017-08-26: qty 6.7

## 2017-08-26 MED ORDER — METHYLPREDNISOLONE SODIUM SUCC 125 MG IJ SOLR
125.0000 mg | Freq: Once | INTRAMUSCULAR | Status: AC
  Administered 2017-08-26: 125 mg via INTRAVENOUS
  Filled 2017-08-26: qty 2

## 2017-08-26 NOTE — ED Triage Notes (Signed)
Dyspnea x 2 weeeks

## 2017-08-26 NOTE — ED Notes (Signed)
Tech TE ambulated patient down hallway while monitoring patient's pulse oxygen saturation. Pt stayed in low 90s (92% sat)

## 2017-08-26 NOTE — ED Notes (Signed)
Pt daughter brought father here after 2 weeks of  Breathing difficulties  Pt tachypnic and dyspneic with loud audible wheezes  Using accessory muscles-   Resp called , rad called

## 2017-08-26 NOTE — ED Provider Notes (Addendum)
[facility] EMERGENCY DEPARTMENT Provider Note   CSN: [id number] Arrival date & time: 08/26/17  2144     History   Chief Complaint Chief Complaint  Patient presents with  . Shortness of Breath    HPI Jose Schaefer is a 67 y.o. male.  HPI Patient with history of COPD.  Not currently on any medications.  May have history of hypertension also and is not on medicines.  History of both smoking cigarettes and smoking cocaine.  States he has not smoked cocaine in a few days however.  Has had a cough without real sputum production.  No chest pain.  No swelling in his legs.  Does not have an inhaler. Past Medical History:  Diagnosis Date  . Asthma   . COPD (chronic obstructive pulmonary disease) (HCC)   . CVA (cerebral vascular accident) [facility](HCC) 2001   [place]  . Tobacco abuse     Patient Active Problem List   Diagnosis Date Noted  . Cerebral infarction due to unspecified mechanism   . CVA (cerebral infarction) 08/11/2015  . Cocaine abuse (HCC) 08/11/2015  . ARF (acute renal failure) (HCC) 08/11/2015  . Acute respiratory failure (HCC) 06/26/2011  . COPD (chronic obstructive pulmonary disease) with acute bronchitis (HCC) 06/26/2011  . Tobacco abuse   . Asthma     History reviewed. No pertinent surgical history.     Home Medications    Prior to Admission medications   Medication Sig Start Date End Date Taking? Authorizing Provider  albuterol (PROVENTIL HFA;VENTOLIN HFA) 108 (90 Base) MCG/ACT inhaler Inhale 2 puffs into the lungs every 2 (two) hours as needed for wheezing or shortness of breath (cough). 01/04/17   Gilda CreasePollina, Christopher J, MD  predniSONE (DELTASONE) 20 MG tablet Take 2 tablets (40 mg total) by mouth daily. 08/26/17   Benjiman CorePickering, Brixton Schnapp, MD    Family History No family history on file.  Social History Social History   Tobacco Use  . Smoking status: Current Every Day Smoker    Packs/day: 0.50    Years: 40.00    Pack years: 20.00    Types: Cigarettes  .  Smokeless tobacco: Never Used  Substance Use Topics  . Alcohol use: Yes    Alcohol/week: 8.4 oz    Types: 4 Cans of beer, 10 Shots of liquor per week    Comment: as much as i can get  . Drug use: Yes    Types: Cocaine, Marijuana     Allergies   Patient has no known allergies.   Review of Systems Review of Systems  Constitutional: Negative for appetite change.  HENT: Negative for congestion.   Respiratory: Positive for cough, shortness of breath and wheezing.   Cardiovascular: Negative for chest pain.  Gastrointestinal: Negative for abdominal pain.  Genitourinary: Negative for flank pain.  Musculoskeletal: Negative for back pain.  Neurological: Negative for speech difficulty.  Hematological: Negative for adenopathy.  Psychiatric/Behavioral: Negative for confusion.     Physical Exam Updated Vital Signs BP (!) 128/92   Pulse 76   Resp 15   Wt 49.9 kg (110 lb)   SpO2 (!) 89%   BMI 16.73 kg/m   Physical Exam  Constitutional: He appears well-developed.  HENT:  Head: Atraumatic.  Eyes: Pupils are equal, round, and reactive to light.  Neck: Neck supple.  Cardiovascular: Regular rhythm.  Pulmonary/Chest:  Audible wheezes and occasional cough.  Some rhonchi.  Mild respiratory distress.  Abdominal: Soft. There is no tenderness.  Musculoskeletal:  Right lower leg: He exhibits no edema.       Left lower leg: He exhibits no edema.  Neurological: He is alert.  Skin: Skin is warm. Capillary refill takes less than 2 seconds.  Psychiatric: He has a normal mood and affect.     ED Treatments / Results  Labs (all labs ordered are listed, but only abnormal results are displayed) Labs Reviewed  BASIC METABOLIC PANEL - Abnormal; Notable for the following components:      Result Value   Glucose, Bld 100 (*)    All other components within normal limits  TROPONIN I  CBC WITH DIFFERENTIAL/PLATELET    EKG  EKG Interpretation  Date/Time:  Saturday August 26 2017  23:18:57 EST Ventricular Rate:  72 PR Interval:    QRS Duration: 84 QT Interval:  412 QTC Calculation: 451 R Axis:   69 Text Interpretation:  Sinus rhythm Multiple ventricular premature complexes Anterior infarct, old Baseline wander in lead(s) V5 Confirmed by Benjiman Core [phone]) on 08/26/2017 11:24:20 PM       Radiology Dg Chest Portable 1 View  Result Date: 08/26/2017 CLINICAL DATA:  Shortness of breath, breathing difficulties with wheezing. EXAM: PORTABLE CHEST 1 VIEW COMPARISON:  Chest radiograph January 04, 2017 FINDINGS: Cardiomediastinal silhouette is normal. Calcified aortic arch. No pleural effusions or focal consolidations. Similar bronchitic changes. Trachea projects midline and there is no pneumothorax. LEFT lung base nipple shadow. Soft tissue planes and included osseous structures are non-suspicious. IMPRESSION: Similar bronchitic changes without focal consolidation. Aortic Atherosclerosis (ICD10-I70.0). Electronically Signed   By: Awilda Metro M.D.   On: 08/26/2017 22:17    Procedures Procedures (including critical care time)  Medications Ordered in ED Medications  albuterol (PROVENTIL HFA;VENTOLIN HFA) 108 (90 Base) MCG/ACT inhaler 2 puff (not administered)  ipratropium (ATROVENT) 0.02 % nebulizer solution (0.5 mg  Given 08/26/17 2208)  albuterol (PROVENTIL) (2.5 MG/3ML) 0.083% nebulizer solution (5 mg  Given 08/26/17 2207)  methylPREDNISolone sodium succinate (SOLU-MEDROL) 125 mg/2 mL injection 125 mg (125 mg Intravenous Given 08/26/17 2223)     Initial Impression / Assessment and Plan / ED Course  I have reviewed the triage vital signs and the nursing notes.  Pertinent labs & imaging results that were available during my care of the patient were reviewed by me and considered in my medical decision making (see chart for details).     Patient with shortness of breath and cough.  Oxygenation improved after breathing treatments.  Blood pressure came down after  breathing treatment.  Needs to follow-up with a primary care doctor.  Also would likely benefit from stopping cocaine.  Final Clinical Impressions(s) / ED Diagnoses   Final diagnoses:  COPD exacerbation [facility])    ED Discharge Orders        Ordered    predniSONE (DELTASONE) 20 MG tablet  Daily     08/26/17 2318       Benjiman Core, MD 08/26/17 2320    Benjiman Core, MD 08/26/17 2324

## 2017-10-09 ENCOUNTER — Emergency Department (HOSPITAL_COMMUNITY)
Admission: EM | Admit: 2017-10-09 | Discharge: 2017-10-10 | Disposition: A | Discharge status: Home / Self Care | Payer: Medicare Other | Attending: Emergency Medicine | Admitting: Emergency Medicine

## 2017-10-09 ENCOUNTER — Other Ambulatory Visit: Payer: Self-pay

## 2017-10-09 ENCOUNTER — Encounter (HOSPITAL_COMMUNITY): Payer: Self-pay | Admitting: *Deleted

## 2017-10-09 DIAGNOSIS — S12100A Unspecified displaced fracture of second cervical vertebra, initial encounter for closed fracture: Secondary | ICD-10-CM

## 2017-10-09 DIAGNOSIS — Y999 Unspecified external cause status: Secondary | ICD-10-CM | POA: Insufficient documentation

## 2017-10-09 DIAGNOSIS — J45909 Unspecified asthma, uncomplicated: Secondary | ICD-10-CM | POA: Diagnosis not present

## 2017-10-09 DIAGNOSIS — R82998 Other abnormal findings in urine: Secondary | ICD-10-CM | POA: Diagnosis not present

## 2017-10-09 DIAGNOSIS — Y9389 Activity, other specified: Secondary | ICD-10-CM | POA: Insufficient documentation

## 2017-10-09 DIAGNOSIS — S12121A Other nondisplaced dens fracture, initial encounter for closed fracture: Secondary | ICD-10-CM | POA: Insufficient documentation

## 2017-10-09 DIAGNOSIS — Y92012 Bathroom of single-family (private) house as the place of occurrence of the external cause: Secondary | ICD-10-CM | POA: Diagnosis not present

## 2017-10-09 DIAGNOSIS — S0990XA Unspecified injury of head, initial encounter: Secondary | ICD-10-CM | POA: Diagnosis present

## 2017-10-09 DIAGNOSIS — F17210 Nicotine dependence, cigarettes, uncomplicated: Secondary | ICD-10-CM | POA: Diagnosis not present

## 2017-10-09 DIAGNOSIS — J449 Chronic obstructive pulmonary disease, unspecified: Secondary | ICD-10-CM | POA: Insufficient documentation

## 2017-10-09 DIAGNOSIS — W19XXXA Unspecified fall, initial encounter: Secondary | ICD-10-CM | POA: Diagnosis not present

## 2017-10-09 NOTE — ED Triage Notes (Signed)
Pt brought in by [facility] for c/o possible fall; daughter reported to ems she found pt lying in the bathroom; cbg 96; pt states he does not know what happened he just fell

## 2017-10-10 ENCOUNTER — Emergency Department (HOSPITAL_COMMUNITY): Payer: Medicare Other

## 2017-10-10 ENCOUNTER — Other Ambulatory Visit: Payer: Self-pay

## 2017-10-10 LAB — CBC WITH DIFFERENTIAL/PLATELET
BASOS ABS: 0 10*3/uL (ref 0.0–0.1)
Basophils Relative: 1 %
Eosinophils Absolute: 0.4 10*3/uL (ref 0.0–0.7)
Eosinophils Relative: 5 %
HEMATOCRIT: 45.2 % (ref 39.0–52.0)
Hemoglobin: 15.1 g/dL (ref 13.0–17.0)
LYMPHS ABS: 0.9 10*3/uL (ref 0.7–4.0)
LYMPHS PCT: 10 %
MCH: 29.2 pg (ref 26.0–34.0)
MCHC: 33.4 g/dL (ref 30.0–36.0)
MCV: 87.3 fL (ref 78.0–100.0)
MONO ABS: 0.6 10*3/uL (ref 0.1–1.0)
Monocytes Relative: 7 %
NEUTROS ABS: 6.7 10*3/uL (ref 1.7–7.7)
Neutrophils Relative %: 77 %
Platelets: 172 10*3/uL (ref 150–400)
RBC: 5.18 MIL/uL (ref 4.22–5.81)
RDW: 14.1 % (ref 11.5–15.5)
WBC: 8.6 10*3/uL (ref 4.0–10.5)

## 2017-10-10 LAB — URINALYSIS, ROUTINE W REFLEX MICROSCOPIC
BACTERIA UA: NONE SEEN
Bilirubin Urine: NEGATIVE
Glucose, UA: NEGATIVE mg/dL
KETONES UR: NEGATIVE mg/dL
Nitrite: NEGATIVE
Protein, ur: NEGATIVE mg/dL
Specific Gravity, Urine: 1.013 (ref 1.005–1.030)
pH: 5 (ref 5.0–8.0)

## 2017-10-10 LAB — BASIC METABOLIC PANEL
ANION GAP: 11 (ref 5–15)
BUN: 11 mg/dL (ref 6–20)
CHLORIDE: 103 mmol/L (ref 101–111)
CO2: 25 mmol/L (ref 22–32)
Calcium: 8.9 mg/dL (ref 8.9–10.3)
Creatinine, Ser: 1.35 mg/dL — ABNORMAL HIGH (ref 0.61–1.24)
GFR calc Af Amer: >60 mL/min (ref >60)
GFR, EST NON AFRICAN AMERICAN: 53 mL/min — AB (ref >60)
GLUCOSE: 91 mg/dL (ref 65–99)
POTASSIUM: 4.2 mmol/L (ref 3.5–5.1)
Sodium: 139 mmol/L (ref 135–145)

## 2017-10-10 MED ORDER — ALBUTEROL SULFATE (2.5 MG/3ML) 0.083% IN NEBU
2.5000 mg | INHALATION_SOLUTION | Freq: Once | RESPIRATORY_TRACT | Status: AC
  Administered 2017-10-10: 2.5 mg via RESPIRATORY_TRACT
  Filled 2017-10-10: qty 3

## 2017-10-10 MED ORDER — CEPHALEXIN 500 MG PO CAPS
500.0000 mg | ORAL_CAPSULE | Freq: Once | ORAL | Status: AC
  Administered 2017-10-10: 500 mg via ORAL
  Filled 2017-10-10: qty 1

## 2017-10-10 NOTE — ED Provider Notes (Signed)
[facility] EMERGENCY DEPARTMENT Provider Note   CSN: [id number] Arrival date & time: 10/09/17  2219     History   Chief Complaint Chief Complaint  Patient presents with  . Fall    HPI Jose Schaefer is a 67 y.o. male.  Patient is a 67 year old male who presents to the emergency department by EMS after he was found lying on the bathroom floor.  The information obtained was primarily through the paramedic personnel and the patient's daughter.  The information received is that the daughter told EMS personnel that she found the patient lying on the bathroom floor.  She presumes that the patient fell.  The patient says that he fell, and he does not know exactly why he fell.  The CBG was 96.  It is of note that the patient has a history of cerebrovascular accident, chronic obstructive pulmonary disease, and tobacco abuse.  It is unknown if the patient suffered from loss of consciousness or not.  The patient states that he is cold and sore, but denies other problems.      Past Medical History:  Diagnosis Date  . Asthma   . COPD (chronic obstructive pulmonary disease) (HCC)   . CVA (cerebral vascular accident) [facility]) 2001   [place]  . Tobacco abuse     Patient Active Problem List   Diagnosis Date Noted  . Cerebral infarction due to unspecified mechanism   . CVA (cerebral infarction) 08/11/2015  . Cocaine abuse (HCC) 08/11/2015  . ARF (acute renal failure) (HCC) 08/11/2015  . Acute respiratory failure (HCC) 06/26/2011  . COPD (chronic obstructive pulmonary disease) with acute bronchitis (HCC) 06/26/2011  . Tobacco abuse   . Asthma     History reviewed. No pertinent surgical history.     Home Medications    Prior to Admission medications   Medication Sig Start Date End Date Taking? Authorizing Provider  albuterol (PROVENTIL HFA;VENTOLIN HFA) 108 (90 Base) MCG/ACT inhaler Inhale 2 puffs into the lungs every 2 (two) hours as needed for wheezing or shortness of breath  (cough). 01/04/17   Gilda Crease, MD  predniSONE (DELTASONE) 20 MG tablet Take 2 tablets (40 mg total) by mouth daily. 08/26/17   Benjiman Core, MD    Family History History reviewed. No pertinent family history.  Social History Social History   Tobacco Use  . Smoking status: Current Every Day Smoker    Packs/day: 0.50    Years: 40.00    Pack years: 20.00    Types: Cigarettes  . Smokeless tobacco: Never Used  Substance Use Topics  . Alcohol use: Yes    Alcohol/week: 8.4 oz    Types: 4 Cans of beer, 10 Shots of liquor per week    Comment: as much as i can get  . Drug use: Yes    Types: Cocaine, Marijuana     Allergies   Patient has no known allergies.   Review of Systems Review of Systems  Constitutional: Negative for activity change.       All ROS Neg except as noted in HPI  HENT: Negative for nosebleeds.   Eyes: Negative for photophobia and discharge.  Respiratory: Negative for cough, shortness of breath and wheezing.   Cardiovascular: Negative for chest pain and palpitations.  Gastrointestinal: Negative for abdominal pain and blood in stool.  Genitourinary: Negative for dysuria, frequency and hematuria.  Musculoskeletal: Positive for arthralgias and myalgias. Negative for back pain and neck pain.  Skin: Negative.   Neurological: Negative for dizziness, seizures  and speech difficulty.  Psychiatric/Behavioral: Negative for confusion and hallucinations.     Physical Exam Updated Vital Signs BP (!) 144/87   Pulse (!) 56   Temp 98.7 F (37.1 C) (Rectal)   Resp 16   Ht 5\' 8"  (1.727 m)   Wt 61.2 kg (135 lb)   SpO2 96%   BMI 20.53 kg/m   Physical Exam  Constitutional: He is oriented to person, place, and time. He appears well-developed and well-nourished.  Non-toxic appearance.  HENT:  Head: Normocephalic. Head is with abrasion and with contusion. Head is without raccoon's eyes, without Battle's sign, without right periorbital erythema and without  left periorbital erythema.    Right Ear: Tympanic membrane and external ear normal.  Left Ear: Tympanic membrane and external ear normal.  Eyes: EOM and lids are normal. Pupils are equal, round, and reactive to light.  Neck: Normal range of motion. Neck supple. Carotid bruit is not present.  Cardiovascular: Normal rate, regular rhythm, normal heart sounds, intact distal pulses and normal pulses.  Pulmonary/Chest: Breath sounds normal. No respiratory distress.  There is symmetrical rise and fall of the chest.  The patient speaks in complete sentences.  There are bilateral rhonchi present.  Occasional distant wheeze noted.  No chest wall tenderness appreciated on palpation.  Abdominal: Soft. Bowel sounds are normal. There is no tenderness. There is no guarding.  Musculoskeletal: Normal range of motion.  There is no pain with rocking of the pelvis.  No pain with movement of the hips.  There is no shortening of the lower extremities.  No bruising appreciated of the upper or lower extremities.  The radial pulses are 2+.  The dorsalis pedis pulses are 2+.  Lymphadenopathy:       Head (right side): No submandibular adenopathy present.       Head (left side): No submandibular adenopathy present.    He has no cervical adenopathy.  Neurological: He is alert and oriented to person, place, and time. He has normal strength. No cranial nerve deficit or sensory deficit.  Skin: Skin is warm and dry.  Psychiatric: He has a normal mood and affect. His speech is normal.  Nursing note and vitals reviewed.    ED Treatments / Results  Labs (all labs ordered are listed, but only abnormal results are displayed) Labs Reviewed  BASIC METABOLIC PANEL  CBC WITH DIFFERENTIAL/PLATELET  URINALYSIS, ROUTINE W REFLEX MICROSCOPIC    EKG  EKG Interpretation None       Radiology No results found.  Procedures Procedures (including critical care time)  Medications Ordered in ED Medications  albuterol  (PROVENTIL) (2.5 MG/3ML) 0.083% nebulizer solution 2.5 mg (2.5 mg Nebulization Given 10/10/17 0043)     Initial Impression / Assessment and Plan / ED Course  I have reviewed the triage vital signs and the nursing notes.  Pertinent labs & imaging results that were available during my care of the patient were reviewed by me and considered in my medical decision making (see chart for details).       Final Clinical Impressions(s) / ED Diagnoses mdm Complete blood count is well within normal limits. EKG -EKG shows a normal sinus rhythm at 56 bpm.  There is some left atrial enlargement noted.  There is some nonspecific T wave changes noted in the lateral leads.  There is no evidence of a STEMI.  And there is no high degree block, or life-threatening arrhythmia noted.  Patient has rhonchi present, and a few distant scattered wheezes present.  Albuterol inhaler ordered.  Basic metabolic panel shows a creatinine to be elevated at 1.35, otherwise within normal limits.  The complete blood count is within normal limits.  Urine analysis shows a clear yellow specimen with a specific gravity 1.013.  There is a small amount of glucose on the dipstick.  There is moderate leukocyte esterase.  There are too many to count white blood cells noted.  A culture of the urine has been sent to the lab.  Patient will be treated with Keflex p.o. here in the emergency department.  Chest x-ray shows no active cardiopulmonary disease.  There is noted underlying emphysema/chronic obstructive pulmonary disease.  X-ray of the pelvis shows no evidence of fracture or dislocation.  CT scan of the cervical spine shows a acute type II dens fracture with 4 mm displacement.  There is also remote C5 and C6 lamina fractures.  There is multiple areas of degenerative change present. Pt fitted with a hard collar.  2:42 AM -patient's care to be continued by Dr. Judd Lien.   Final diagnoses:  Fall, initial encounter  Closed odontoid  fracture, initial encounter [facility])    ED Discharge Orders    None       Ivery Quale, PA-C 10/11/17 0216    Geoffery Lyons, MD 10/17/17 [phone]

## 2017-10-10 NOTE — Discharge Instructions (Signed)
Wear cervical collar as applied until followed up by neurosurgery.  Follow-up with neurosurgery in the next week.  The contact information for Dr. Franky Machoabbell has been provided in this discharge summary for you to call and make these arrangements tomorrow morning.

## 2017-10-11 LAB — URINE CULTURE: Culture: NO GROWTH

## 2018-05-06 ENCOUNTER — Emergency Department (HOSPITAL_COMMUNITY): Payer: Medicare Other

## 2018-05-06 ENCOUNTER — Other Ambulatory Visit: Payer: Self-pay

## 2018-05-06 ENCOUNTER — Emergency Department (HOSPITAL_COMMUNITY)
Admission: EM | Admit: 2018-05-06 | Discharge: 2018-05-06 | Disposition: A | Discharge status: Home / Self Care | Payer: Medicare Other | Attending: Emergency Medicine | Admitting: Emergency Medicine

## 2018-05-06 ENCOUNTER — Encounter (HOSPITAL_COMMUNITY): Payer: Self-pay | Admitting: Emergency Medicine

## 2018-05-06 DIAGNOSIS — R0602 Shortness of breath: Secondary | ICD-10-CM | POA: Diagnosis present

## 2018-05-06 DIAGNOSIS — J441 Chronic obstructive pulmonary disease with (acute) exacerbation: Secondary | ICD-10-CM | POA: Diagnosis not present

## 2018-05-06 DIAGNOSIS — Z8673 Personal history of transient ischemic attack (TIA), and cerebral infarction without residual deficits: Secondary | ICD-10-CM | POA: Insufficient documentation

## 2018-05-06 DIAGNOSIS — F17210 Nicotine dependence, cigarettes, uncomplicated: Secondary | ICD-10-CM | POA: Insufficient documentation

## 2018-05-06 LAB — COMPREHENSIVE METABOLIC PANEL
ALBUMIN: 4.1 g/dL (ref 3.5–5.0)
ALT: 11 U/L (ref 0–44)
AST: 13 U/L — AB (ref 15–41)
Alkaline Phosphatase: 65 U/L (ref 38–126)
Anion gap: 7 (ref 5–15)
BUN: 13 mg/dL (ref 8–23)
CO2: 28 mmol/L (ref 22–32)
CREATININE: 1.12 mg/dL (ref 0.61–1.24)
Calcium: 9 mg/dL (ref 8.9–10.3)
Chloride: 106 mmol/L (ref 98–111)
GFR calc Af Amer: >60 mL/min (ref >60)
GFR calc non Af Amer: >60 mL/min (ref >60)
GLUCOSE: 84 mg/dL (ref 70–99)
POTASSIUM: 3.6 mmol/L (ref 3.5–5.1)
SODIUM: 141 mmol/L (ref 135–145)
Total Bilirubin: 0.7 mg/dL (ref 0.3–1.2)
Total Protein: 7.6 g/dL (ref 6.5–8.1)

## 2018-05-06 LAB — CBC WITH DIFFERENTIAL/PLATELET
ABS IMMATURE GRANULOCYTES: 0.01 10*3/uL (ref 0.00–0.07)
BASOS ABS: 0.1 10*3/uL (ref 0.0–0.1)
BASOS PCT: 2 %
Eosinophils Absolute: 0.6 10*3/uL — ABNORMAL HIGH (ref 0.0–0.5)
Eosinophils Relative: 12 %
HCT: 45.5 % (ref 39.0–52.0)
Hemoglobin: 14.3 g/dL (ref 13.0–17.0)
IMMATURE GRANULOCYTES: 0 %
Lymphocytes Relative: 28 %
Lymphs Abs: 1.4 10*3/uL (ref 0.7–4.0)
MCH: 27.8 pg (ref 26.0–34.0)
MCHC: 31.4 g/dL (ref 30.0–36.0)
MCV: 88.3 fL (ref 80.0–100.0)
MONOS PCT: 11 %
Monocytes Absolute: 0.6 10*3/uL (ref 0.1–1.0)
NEUTROS ABS: 2.4 10*3/uL (ref 1.7–7.7)
NEUTROS PCT: 47 %
NRBC: 0 % (ref 0.0–0.2)
Platelets: 216 10*3/uL (ref 150–400)
RBC: 5.15 MIL/uL (ref 4.22–5.81)
RDW: 14.2 % (ref 11.5–15.5)
WBC: 5 10*3/uL (ref 4.0–10.5)

## 2018-05-06 LAB — TROPONIN I: Troponin I: <0.03 ng/mL (ref <0.03)

## 2018-05-06 MED ORDER — IPRATROPIUM BROMIDE 0.02 % IN SOLN
RESPIRATORY_TRACT | Status: AC
  Administered 2018-05-06: 1 mg via RESPIRATORY_TRACT
  Filled 2018-05-06: qty 2.5

## 2018-05-06 MED ORDER — IPRATROPIUM-ALBUTEROL 0.5-2.5 (3) MG/3ML IN SOLN: 3.0000 mL | Freq: Once | RESPIRATORY_TRACT | Status: DC

## 2018-05-06 MED ORDER — METHYLPREDNISOLONE SODIUM SUCC 125 MG IJ SOLR
125.0000 mg | Freq: Once | INTRAMUSCULAR | Status: AC
  Administered 2018-05-06: 125 mg via INTRAVENOUS
  Filled 2018-05-06: qty 2

## 2018-05-06 MED ORDER — ALBUTEROL SULFATE (2.5 MG/3ML) 0.083% IN NEBU: 2.5000 mg | INHALATION_SOLUTION | Freq: Four times a day (QID) | RESPIRATORY_TRACT | 2 refills | Status: DC | PRN

## 2018-05-06 MED ORDER — SODIUM CHLORIDE 0.9 % IV BOLUS
500.0000 mL | Freq: Once | INTRAVENOUS | Status: AC
  Administered 2018-05-06: 500 mL via INTRAVENOUS

## 2018-05-06 MED ORDER — ALBUTEROL (5 MG/ML) CONTINUOUS INHALATION SOLN
INHALATION_SOLUTION | RESPIRATORY_TRACT | Status: AC
  Administered 2018-05-06: 10 mg/h via RESPIRATORY_TRACT
  Filled 2018-05-06: qty 20

## 2018-05-06 MED ORDER — IPRATROPIUM BROMIDE 0.02 % IN SOLN
1.0000 mg | Freq: Once | RESPIRATORY_TRACT | Status: AC
  Administered 2018-05-06: 1 mg via RESPIRATORY_TRACT

## 2018-05-06 MED ORDER — ALBUTEROL (5 MG/ML) CONTINUOUS INHALATION SOLN
10.0000 mg/h | INHALATION_SOLUTION | RESPIRATORY_TRACT | Status: DC
  Administered 2018-05-06: 10 mg/h via RESPIRATORY_TRACT

## 2018-05-06 MED ORDER — PREDNISONE 50 MG PO TABS: ORAL_TABLET | ORAL | 0 refills | Status: DC

## 2018-05-06 MED ORDER — MAGNESIUM SULFATE 2 GM/50ML IV SOLN
2.0000 g | Freq: Once | INTRAVENOUS | Status: AC
  Administered 2018-05-06: 2 g via INTRAVENOUS
  Filled 2018-05-06: qty 50

## 2018-05-06 NOTE — Discharge Instructions (Addendum)
X-ray shows no pneumonia.  No smoking.  Prescription for prednisone and medication for a nebulizer machine.

## 2018-05-06 NOTE — ED Triage Notes (Signed)
Pt's daughter brings pt in for shortness of breath.  States pt woke up this morning with significant audible wheezing noted.  Pt denies any complaint but respirations labored with audible inspiratory and expiratory wheezing noted.

## 2018-05-06 NOTE — ED Provider Notes (Signed)
[facility] EMERGENCY DEPARTMENT Provider Note   CSN: [id number] Arrival date & time: 05/06/18  0820     History   Chief Complaint Chief Complaint  Patient presents with  . Shortness of Breath    HPI Jose Schaefer is a 67 y.o. male.  Level 5 caveat for acuity of condition.  Patient presents with extreme dyspnea and wheezing for several days, getting worse today.  He has a known history of COPD and has been smoking cigarettes lately.  No substernal chest pain.     Past Medical History:  Diagnosis Date  . Asthma   . COPD (chronic obstructive pulmonary disease) (HCC)   . CVA (cerebral vascular accident) [facility]) 2001   [place]  . Tobacco abuse     Patient Active Problem List   Diagnosis Date Noted  . Cerebral infarction due to unspecified mechanism   . CVA (cerebral infarction) 08/11/2015  . Cocaine abuse (HCC) 08/11/2015  . ARF (acute renal failure) (HCC) 08/11/2015  . Acute respiratory failure (HCC) 06/26/2011  . COPD (chronic obstructive pulmonary disease) with acute bronchitis (HCC) 06/26/2011  . Tobacco abuse   . Asthma     History reviewed. No pertinent surgical history.      Home Medications    Prior to Admission medications   Medication Sig Start Date End Date Taking? Authorizing Provider  albuterol (PROVENTIL HFA;VENTOLIN HFA) 108 (90 Base) MCG/ACT inhaler Inhale 2 puffs into the lungs every 2 (two) hours as needed for wheezing or shortness of breath (cough). Patient not taking: Reported on 05/06/2018 01/04/17   Gilda Crease, MD  albuterol (PROVENTIL) (2.5 MG/3ML) 0.083% nebulizer solution Take 3 mLs (2.5 mg total) by nebulization every 6 (six) hours as needed for wheezing or shortness of breath. Patient will need a nebulizer machine. 05/06/18   Donnetta Hutching, MD  predniSONE (DELTASONE) 50 MG tablet 3 tabs for 3 days; 2 tabs for 3 days; 1 tab for 3 days 05/06/18   Donnetta Hutching, MD    Family History History reviewed. No pertinent family  history.  Social History Social History   Tobacco Use  . Smoking status: Current Every Day Smoker    Packs/day: 1.00    Years: 40.00    Pack years: 40.00    Types: Cigarettes  . Smokeless tobacco: Never Used  Substance Use Topics  . Alcohol use: Yes    Alcohol/week: 14.0 standard drinks    Types: 4 Cans of beer, 10 Shots of liquor per week    Comment: as much as i can get, not current per daughter  . Drug use: Yes    Types: Cocaine, Marijuana     Allergies   Patient has no known allergies.   Review of Systems Review of Systems  Unable to perform ROS: Acuity of condition     Physical Exam Updated Vital Signs BP 116/72   Pulse 75   Temp 98.1 F (36.7 C) (Oral)   Resp 17   Ht 5\' 8"  (1.727 m)   Wt 70.3 kg   SpO2 97%   BMI 23.57 kg/m   Physical Exam  Constitutional: He is oriented to person, place, and time.  Tachypneic, inspiratory and expiratory wheezing  HENT:  Head: Normocephalic and atraumatic.  Eyes: Conjunctivae are normal.  Neck: Neck supple.  Cardiovascular: Normal rate and regular rhythm.  Pulmonary/Chest:  Respiratory distress; using accessory muscles  Abdominal: Soft. Bowel sounds are normal.  Musculoskeletal: Normal range of motion.  Neurological: He is alert and oriented to person,  place, and time.  Skin: Skin is warm and dry.  Psychiatric: He has a normal mood and affect. His behavior is normal.  Nursing note and vitals reviewed.    ED Treatments / Results  Labs (all labs ordered are listed, but only abnormal results are displayed) Labs Reviewed  CBC WITH DIFFERENTIAL/PLATELET - Abnormal; Notable for the following components:      Result Value   Eosinophils Absolute 0.6 (*)    All other components within normal limits  COMPREHENSIVE METABOLIC PANEL - Abnormal; Notable for the following components:   AST 13 (*)    All other components within normal limits  TROPONIN I    EKG EKG Interpretation  Date/Time:  Sunday May 06 2018  08:34:35 EDT Ventricular Rate:  90 PR Interval:    QRS Duration: 90 QT Interval:  392 QTC Calculation: 475 R Axis:   64 Text Interpretation:  Sinus rhythm Ventricular premature complex Biatrial enlargement Anteroseptal infarct, age indeterminate Minimal ST elevation, inferior leads Confirmed by Donnetta Hutching ([id number]) on 05/06/2018 9:10:20 AM   Radiology Dg Chest Port 1 View  Result Date: 05/06/2018 CLINICAL DATA:  Shortness of breath for 2 days EXAM: PORTABLE CHEST 1 VIEW COMPARISON:  10/10/2017 FINDINGS: Cardiac shadow is stable. Lungs are well aerated bilaterally. No focal infiltrate or sizable effusion is seen. No bony abnormality is noted. IMPRESSION: No acute abnormality noted. Electronically Signed   By: Alcide Clever M.D.   On: 05/06/2018 09:47    Procedures Procedures (including critical care time)  Medications Ordered in ED Medications  albuterol (PROVENTIL,VENTOLIN) solution continuous neb (0 mg/hr Nebulization Stopped 05/06/18 0943)  sodium chloride 0.9 % bolus 500 mL (0 mLs Intravenous Stopped 05/06/18 0942)  methylPREDNISolone sodium succinate (SOLU-MEDROL) 125 mg/2 mL injection 125 mg (125 mg Intravenous Given 05/06/18 0857)  magnesium sulfate IVPB 2 g 50 mL (0 g Intravenous Stopped 05/06/18 0958)  ipratropium (ATROVENT) nebulizer solution 1 mg (1 mg Nebulization Given 05/06/18 0841)     Initial Impression / Assessment and Plan / ED Course  I have reviewed the triage vital signs and the nursing notes.  Pertinent labs & imaging results that were available during my care of the patient were reviewed by me and considered in my medical decision making (see chart for details).     Patient presents with a severe COPD exacerbation.  Chest x-ray negative.  He responded well to a continuous nebulizer treatment, IV steroids, IV magnesium.  He was observed for 2 to 3 hours.  Hemodynamically stable at discharge.  Discharge medication albuterol solution with nebulizer machine,  prednisone.  Encouraged to stop smoking.  CRITICAL CARE Performed by: Donnetta Hutching Total critical care time: 30 minutes Critical care time was exclusive of separately billable procedures and treating other patients. Critical care was necessary to treat or prevent imminent or life-threatening deterioration. Critical care was time spent personally by me on the following activities: development of treatment plan with patient and/or surrogate as well as nursing, discussions with consultants, evaluation of patient's response to treatment, examination of patient, obtaining history from patient or surrogate, ordering and performing treatments and interventions, ordering and review of laboratory studies, ordering and review of radiographic studies, pulse oximetry and re-evaluation of patient's condition.  Final Clinical Impressions(s) / ED Diagnoses   Final diagnoses:  COPD exacerbation [facility])    ED Discharge Orders         Ordered    albuterol (PROVENTIL) (2.5 MG/3ML) 0.083% nebulizer solution  Every 6 hours PRN  05/06/18 1346    predniSONE (DELTASONE) 50 MG tablet     05/06/18 1346           Donnetta Hutching, MD 05/06/18 1530

## 2018-08-19 ENCOUNTER — Emergency Department (HOSPITAL_COMMUNITY)
Admission: EM | Admit: 2018-08-19 | Discharge: 2018-08-19 | Disposition: A | Discharge status: Home / Self Care | Payer: Medicare Other | Attending: Emergency Medicine | Admitting: Emergency Medicine

## 2018-08-19 ENCOUNTER — Emergency Department (HOSPITAL_COMMUNITY): Payer: Medicare Other

## 2018-08-19 ENCOUNTER — Other Ambulatory Visit: Payer: Self-pay

## 2018-08-19 ENCOUNTER — Encounter (HOSPITAL_COMMUNITY): Payer: Self-pay

## 2018-08-19 DIAGNOSIS — J9801 Acute bronchospasm: Secondary | ICD-10-CM | POA: Diagnosis not present

## 2018-08-19 DIAGNOSIS — J45909 Unspecified asthma, uncomplicated: Secondary | ICD-10-CM | POA: Insufficient documentation

## 2018-08-19 DIAGNOSIS — F17210 Nicotine dependence, cigarettes, uncomplicated: Secondary | ICD-10-CM | POA: Insufficient documentation

## 2018-08-19 DIAGNOSIS — R0602 Shortness of breath: Secondary | ICD-10-CM | POA: Diagnosis present

## 2018-08-19 DIAGNOSIS — Z8673 Personal history of transient ischemic attack (TIA), and cerebral infarction without residual deficits: Secondary | ICD-10-CM | POA: Insufficient documentation

## 2018-08-19 LAB — URINALYSIS, ROUTINE W REFLEX MICROSCOPIC
BACTERIA UA: NONE SEEN
Bilirubin Urine: NEGATIVE
Glucose, UA: NEGATIVE mg/dL
HGB URINE DIPSTICK: NEGATIVE
Ketones, ur: NEGATIVE mg/dL
Nitrite: NEGATIVE
PROTEIN: NEGATIVE mg/dL
Specific Gravity, Urine: 1.01 (ref 1.005–1.030)
pH: 7 (ref 5.0–8.0)

## 2018-08-19 LAB — BASIC METABOLIC PANEL
ANION GAP: 6 (ref 5–15)
BUN: 14 mg/dL (ref 8–23)
CO2: 28 mmol/L (ref 22–32)
Calcium: 8.8 mg/dL — ABNORMAL LOW (ref 8.9–10.3)
Chloride: 108 mmol/L (ref 98–111)
Creatinine, Ser: 1.34 mg/dL — ABNORMAL HIGH (ref 0.61–1.24)
GFR calc Af Amer: >60 mL/min (ref >60)
GFR calc non Af Amer: 54 mL/min — ABNORMAL LOW (ref >60)
GLUCOSE: 128 mg/dL — AB (ref 70–99)
POTASSIUM: 3.9 mmol/L (ref 3.5–5.1)
Sodium: 142 mmol/L (ref 135–145)

## 2018-08-19 LAB — CBC
HEMATOCRIT: 44.5 % (ref 39.0–52.0)
HEMOGLOBIN: 14.2 g/dL (ref 13.0–17.0)
MCH: 28.1 pg (ref 26.0–34.0)
MCHC: 31.9 g/dL (ref 30.0–36.0)
MCV: 88.1 fL (ref 80.0–100.0)
Platelets: 195 10*3/uL (ref 150–400)
RBC: 5.05 MIL/uL (ref 4.22–5.81)
RDW: 13.9 % (ref 11.5–15.5)
WBC: 6.8 10*3/uL (ref 4.0–10.5)
nRBC: 0 % (ref 0.0–0.2)

## 2018-08-19 MED ORDER — SODIUM CHLORIDE 0.9 % IV BOLUS
1000.0000 mL | Freq: Once | INTRAVENOUS | Status: AC
  Administered 2018-08-19: 1000 mL via INTRAVENOUS

## 2018-08-19 MED ORDER — DOXYCYCLINE HYCLATE 100 MG PO CAPS: 100.0000 mg | ORAL_CAPSULE | Freq: Two times a day (BID) | ORAL | 0 refills | Status: DC

## 2018-08-19 MED ORDER — ALBUTEROL SULFATE (2.5 MG/3ML) 0.083% IN NEBU
2.5000 mg | INHALATION_SOLUTION | Freq: Once | RESPIRATORY_TRACT | Status: AC
  Administered 2018-08-19: 2.5 mg via RESPIRATORY_TRACT
  Filled 2018-08-19: qty 3

## 2018-08-19 MED ORDER — IPRATROPIUM-ALBUTEROL 0.5-2.5 (3) MG/3ML IN SOLN
3.0000 mL | Freq: Once | RESPIRATORY_TRACT | Status: AC
  Administered 2018-08-19: 3 mL via RESPIRATORY_TRACT
  Filled 2018-08-19: qty 3

## 2018-08-19 MED ORDER — PREDNISONE 10 MG PO TABS: 20.0000 mg | ORAL_TABLET | Freq: Every day | ORAL | 0 refills | Status: DC

## 2018-08-19 MED ORDER — MAGNESIUM SULFATE 2 GM/50ML IV SOLN
2.0000 g | Freq: Once | INTRAVENOUS | Status: AC
  Administered 2018-08-19: 2 g via INTRAVENOUS
  Filled 2018-08-19: qty 50

## 2018-08-19 MED ORDER — METHYLPREDNISOLONE SODIUM SUCC 125 MG IJ SOLR
125.0000 mg | Freq: Once | INTRAMUSCULAR | Status: AC
  Administered 2018-08-19: 125 mg via INTRAVENOUS
  Filled 2018-08-19: qty 2

## 2018-08-19 NOTE — ED Notes (Signed)
Attempted to give urine sample. Could not.  

## 2018-08-19 NOTE — ED Notes (Signed)
Pt's daughter on the way at this time ETA 20 minutes.

## 2018-08-19 NOTE — ED Notes (Signed)
Attempted to call pt's daughter at the number provided, going to voicemail and no ability to leave a message d/t mailbox full. Pt has been ready for d/c for 3 hours. Verified with registration this is the only number listed for pt.

## 2018-08-19 NOTE — ED Notes (Signed)
Pt's daughter contacted for pt d/c. Daughter states that she is currently in [place] and will be coming to pick pt up.

## 2018-08-19 NOTE — ED Triage Notes (Signed)
EMS reported that family says he choked on his breakfast this morning. Sats in the 80's and O2 initiated. Family also reported to EMS that pt has been altered for "awhile" and may have a UTI. Pt sounds gurgly

## 2018-08-19 NOTE — Discharge Instructions (Signed)
Follow-up with your family doctor this week for recheck.  Continue using your albuterol as prescribed

## 2018-08-19 NOTE — ED Provider Notes (Signed)
[facility] EMERGENCY DEPARTMENT Provider Note   CSN: [id number] Arrival date & time: 08/19/18  1126     History   Chief Complaint Chief Complaint  Patient presents with  . Choking    HPI Jose Schaefer is a 68 y.o. male.  Patient complains of shortness of breath and wheezing.  Mild cough  The history is provided by the patient.  Shortness of Breath  Severity:  Moderate Onset quality:  Sudden Timing:  Constant Progression:  Waxing and waning Chronicity:  Recurrent Context: activity   Relieved by:  Nothing Worsened by:  Nothing Ineffective treatments:  None tried Associated symptoms: no abdominal pain, no chest pain, no cough, no headaches and no rash     Past Medical History:  Diagnosis Date  . Asthma   . COPD (chronic obstructive pulmonary disease) (HCC)   . CVA (cerebral vascular accident) [facility](HCC) 2001   [place]  . Tobacco abuse     Patient Active Problem List   Diagnosis Date Noted  . Cerebral infarction due to unspecified mechanism   . CVA (cerebral infarction) 08/11/2015  . Cocaine abuse (HCC) 08/11/2015  . ARF (acute renal failure) (HCC) 08/11/2015  . Acute respiratory failure (HCC) 06/26/2011  . COPD (chronic obstructive pulmonary disease) with acute bronchitis (HCC) 06/26/2011  . Tobacco abuse   . Asthma     History reviewed. No pertinent surgical history.      Home Medications    Prior to Admission medications   Medication Sig Start Date End Date Taking? Authorizing Provider  albuterol (PROVENTIL HFA;VENTOLIN HFA) 108 (90 Base) MCG/ACT inhaler Inhale 2 puffs into the lungs every 2 (two) hours as needed for wheezing or shortness of breath (cough). Patient not taking: Reported on 05/06/2018 01/04/17   Gilda CreasePollina, Christopher J, MD  albuterol (PROVENTIL) (2.5 MG/3ML) 0.083% nebulizer solution Take 3 mLs (2.5 mg total) by nebulization every 6 (six) hours as needed for wheezing or shortness of breath. Patient will need a nebulizer machine. 05/06/18    Donnetta Hutchingook, Brian, MD  doxycycline (VIBRAMYCIN) 100 MG capsule Take 1 capsule (100 mg total) by mouth 2 (two) times daily. One po bid x 7 days 08/19/18   Bethann BerkshireZammit, Langley Ingalls, MD  predniSONE (DELTASONE) 10 MG tablet Take 2 tablets (20 mg total) by mouth daily. 08/19/18   Bethann BerkshireZammit, Casmira Cramer, MD    Family History No family history on file.  Social History Social History   Tobacco Use  . Smoking status: Current Every Day Smoker    Packs/day: 1.00    Years: 40.00    Pack years: 40.00    Types: Cigarettes  . Smokeless tobacco: Never Used  Substance Use Topics  . Alcohol use: Not Currently    Alcohol/week: 14.0 standard drinks    Types: 4 Cans of beer, 10 Shots of liquor per week    Comment: as much as i can get, not current per daughter  . Drug use: Yes    Types: Cocaine, Marijuana     Allergies   Patient has no known allergies.   Review of Systems Review of Systems  Constitutional: Negative for appetite change and fatigue.  HENT: Negative for congestion, ear discharge and sinus pressure.   Eyes: Negative for discharge.  Respiratory: Positive for shortness of breath. Negative for cough.   Cardiovascular: Negative for chest pain.  Gastrointestinal: Negative for abdominal pain and diarrhea.  Genitourinary: Negative for frequency and hematuria.  Musculoskeletal: Negative for back pain.  Skin: Negative for rash.  Neurological: Negative for seizures and  headaches.  Psychiatric/Behavioral: Negative for hallucinations.     Physical Exam Updated Vital Signs BP 134/86   Pulse (!) 57   Temp 97.6 F (36.4 C) (Oral)   Resp 16   Ht 5\' 6"  (1.676 m)   Wt 49.9 kg   SpO2 100%   BMI 17.75 kg/m   Physical Exam Vitals signs and nursing note reviewed.  Constitutional:      Appearance: He is well-developed.  HENT:     Head: Normocephalic.     Nose: Nose normal.  Eyes:     General: No scleral icterus.    Conjunctiva/sclera: Conjunctivae normal.  Neck:     Musculoskeletal: Neck supple.      Thyroid: No thyromegaly.  Cardiovascular:     Rate and Rhythm: Normal rate and regular rhythm.     Heart sounds: No murmur. No friction rub. No gallop.   Pulmonary:     Breath sounds: No stridor. Wheezing present. No rales.  Chest:     Chest wall: No tenderness.  Abdominal:     General: There is no distension.     Tenderness: There is no abdominal tenderness. There is no rebound.  Musculoskeletal: Normal range of motion.  Lymphadenopathy:     Cervical: No cervical adenopathy.  Skin:    Findings: No erythema or rash.  Neurological:     Mental Status: He is alert and oriented to person, place, and time.     Motor: No abnormal muscle tone.     Coordination: Coordination normal.  Psychiatric:        Behavior: Behavior normal.      ED Treatments / Results  Labs (all labs ordered are listed, but only abnormal results are displayed) Labs Reviewed  BASIC METABOLIC PANEL - Abnormal; Notable for the following components:      Result Value   Glucose, Bld 128 (*)    Creatinine, Ser 1.34 (*)    Calcium 8.8 (*)    GFR calc non Af Amer 54 (*)    All other components within normal limits  URINALYSIS, ROUTINE W REFLEX MICROSCOPIC - Abnormal; Notable for the following components:   Leukocytes, UA TRACE (*)    All other components within normal limits  CBC    EKG EKG Interpretation  Date/Time:  Sunday August 19 2018 11:45:16 EST Ventricular Rate:  69 PR Interval:    QRS Duration: 89 QT Interval:  412 QTC Calculation: 442 R Axis:   29 Text Interpretation:  Sinus rhythm Multiple ventricular premature complexes Low voltage, extremity and precordial leads Anteroseptal infarct, old No significant change since last tracing Confirmed by Doug Sou [phone]) on 08/19/2018 12:15:34 PM   Radiology Dg Chest Portable 1 View  Result Date: 08/19/2018 CLINICAL DATA:  Possible aspiration with choking and hypoxia. EXAM: PORTABLE CHEST 1 VIEW COMPARISON:  05/24/2018 FINDINGS: Stable heart  size. Stable tortuosity and prominence of the thoracic aorta. Lungs show stable chronic disease with bibasilar scarring and atelectasis present. There is no evidence of pulmonary edema, consolidation, pneumothorax, nodule or pleural fluid. Visualized bony structures are unremarkable. The visualized skeletal structures are unremarkable. IMPRESSION: Stable chronic lung disease.  Bibasilar scarring and atelectasis. Electronically Signed   By: Irish Lack M.D.   On: 08/19/2018 12:49    Procedures Procedures (including critical care time)  Medications Ordered in ED Medications  ipratropium-albuterol (DUONEB) 0.5-2.5 (3) MG/3ML nebulizer solution 3 mL (3 mLs Nebulization Given 08/19/18 1340)  albuterol (PROVENTIL) (2.5 MG/3ML) 0.083% nebulizer solution 2.5 mg (2.5 mg Nebulization  Given 08/19/18 1340)  methylPREDNISolone sodium succinate (SOLU-MEDROL) 125 mg/2 mL injection 125 mg (125 mg Intravenous Given 08/19/18 1345)  magnesium sulfate IVPB 2 g 50 mL ( Intravenous Stopped 08/19/18 1444)  sodium chloride 0.9 % bolus 1,000 mL (0 mLs Intravenous Stopped 08/19/18 1446)     Initial Impression / Assessment and Plan / ED Course  I have reviewed the triage vital signs and the nursing notes.  Pertinent labs & imaging results that were available during my care of the patient were reviewed by me and considered in my medical decision making (see chart for details).     Patient with COPD exacerbation.  Patient improved with neb treatments and steroids and will be sent home with prednisone and doxycycline.  He should follow-up with his primary care doctor  Final Clinical Impressions(s) / ED Diagnoses   Final diagnoses:  Bronchospasm    ED Discharge Orders         Ordered    predniSONE (DELTASONE) 10 MG tablet  Daily     08/19/18 1644    doxycycline (VIBRAMYCIN) 100 MG capsule  2 times daily     08/19/18 1644           Bethann BerkshireZammit, Janavia Rottman, MD 08/19/18 1651

## 2018-10-09 ENCOUNTER — Other Ambulatory Visit: Payer: Self-pay

## 2018-10-09 ENCOUNTER — Inpatient Hospital Stay (HOSPITAL_COMMUNITY)
Admission: EM | Admit: 2018-10-09 | Discharge: 2018-10-13 | DRG: 190 | Disposition: A | Discharge status: Home / Self Care | Payer: Medicare Other | Attending: Family Medicine | Admitting: Family Medicine

## 2018-10-09 ENCOUNTER — Ambulatory Visit: Payer: Medicare Other | Admitting: Gastroenterology

## 2018-10-09 ENCOUNTER — Encounter (HOSPITAL_COMMUNITY): Payer: Self-pay

## 2018-10-09 ENCOUNTER — Emergency Department (HOSPITAL_COMMUNITY): Payer: Medicare Other

## 2018-10-09 DIAGNOSIS — B3781 Candidal esophagitis: Secondary | ICD-10-CM | POA: Diagnosis present

## 2018-10-09 DIAGNOSIS — K209 Esophagitis, unspecified without bleeding: Secondary | ICD-10-CM

## 2018-10-09 DIAGNOSIS — R1310 Dysphagia, unspecified: Secondary | ICD-10-CM | POA: Diagnosis not present

## 2018-10-09 DIAGNOSIS — J9600 Acute respiratory failure, unspecified whether with hypoxia or hypercapnia: Secondary | ICD-10-CM | POA: Diagnosis present

## 2018-10-09 DIAGNOSIS — E43 Unspecified severe protein-calorie malnutrition: Secondary | ICD-10-CM

## 2018-10-09 DIAGNOSIS — R933 Abnormal findings on diagnostic imaging of other parts of digestive tract: Secondary | ICD-10-CM | POA: Diagnosis not present

## 2018-10-09 DIAGNOSIS — Z681 Body mass index (BMI) 19 or less, adult: Secondary | ICD-10-CM | POA: Diagnosis not present

## 2018-10-09 DIAGNOSIS — E440 Moderate protein-calorie malnutrition: Secondary | ICD-10-CM | POA: Diagnosis present

## 2018-10-09 DIAGNOSIS — Z79899 Other long term (current) drug therapy: Secondary | ICD-10-CM | POA: Diagnosis not present

## 2018-10-09 DIAGNOSIS — J441 Chronic obstructive pulmonary disease with (acute) exacerbation: Secondary | ICD-10-CM | POA: Diagnosis present

## 2018-10-09 DIAGNOSIS — Z7952 Long term (current) use of systemic steroids: Secondary | ICD-10-CM | POA: Diagnosis not present

## 2018-10-09 DIAGNOSIS — K2970 Gastritis, unspecified, without bleeding: Secondary | ICD-10-CM | POA: Diagnosis present

## 2018-10-09 DIAGNOSIS — K449 Diaphragmatic hernia without obstruction or gangrene: Secondary | ICD-10-CM | POA: Diagnosis present

## 2018-10-09 DIAGNOSIS — E860 Dehydration: Secondary | ICD-10-CM | POA: Diagnosis present

## 2018-10-09 DIAGNOSIS — J9601 Acute respiratory failure with hypoxia: Secondary | ICD-10-CM | POA: Diagnosis present

## 2018-10-09 DIAGNOSIS — K222 Esophageal obstruction: Secondary | ICD-10-CM | POA: Diagnosis present

## 2018-10-09 DIAGNOSIS — J690 Pneumonitis due to inhalation of food and vomit: Secondary | ICD-10-CM | POA: Diagnosis present

## 2018-10-09 DIAGNOSIS — F17210 Nicotine dependence, cigarettes, uncomplicated: Secondary | ICD-10-CM | POA: Diagnosis present

## 2018-10-09 DIAGNOSIS — Z8673 Personal history of transient ischemic attack (TIA), and cerebral infarction without residual deficits: Secondary | ICD-10-CM

## 2018-10-09 DIAGNOSIS — E872 Acidosis, unspecified: Secondary | ICD-10-CM | POA: Diagnosis present

## 2018-10-09 DIAGNOSIS — T17908A Unspecified foreign body in respiratory tract, part unspecified causing other injury, initial encounter: Secondary | ICD-10-CM | POA: Diagnosis present

## 2018-10-09 LAB — BASIC METABOLIC PANEL
Anion gap: 9 (ref 5–15)
BUN: 13 mg/dL (ref 8–23)
CO2: 29 mmol/L (ref 22–32)
Calcium: 8.9 mg/dL (ref 8.9–10.3)
Chloride: 104 mmol/L (ref 98–111)
Creatinine, Ser: 1.17 mg/dL (ref 0.61–1.24)
GFR calc Af Amer: >60 mL/min (ref >60)
GFR calc non Af Amer: >60 mL/min (ref >60)
Glucose, Bld: 129 mg/dL — ABNORMAL HIGH (ref 70–99)
Potassium: 3.7 mmol/L (ref 3.5–5.1)
Sodium: 142 mmol/L (ref 135–145)

## 2018-10-09 LAB — CBC WITH DIFFERENTIAL/PLATELET
ABS IMMATURE GRANULOCYTES: 0.01 10*3/uL (ref 0.00–0.07)
Basophils Absolute: 0.1 10*3/uL (ref 0.0–0.1)
Basophils Relative: 2 %
Eosinophils Absolute: 0.5 10*3/uL (ref 0.0–0.5)
Eosinophils Relative: 12 %
HCT: 44.6 % (ref 39.0–52.0)
Hemoglobin: 14.4 g/dL (ref 13.0–17.0)
Immature Granulocytes: 0 %
Lymphocytes Relative: 29 %
Lymphs Abs: 1.2 10*3/uL (ref 0.7–4.0)
MCH: 28.3 pg (ref 26.0–34.0)
MCHC: 32.3 g/dL (ref 30.0–36.0)
MCV: 87.6 fL (ref 80.0–100.0)
MONO ABS: 0.4 10*3/uL (ref 0.1–1.0)
Monocytes Relative: 8 %
Neutro Abs: 2.1 10*3/uL (ref 1.7–7.7)
Neutrophils Relative %: 49 %
PLATELETS: 263 10*3/uL (ref 150–400)
RBC: 5.09 MIL/uL (ref 4.22–5.81)
RDW: 14.3 % (ref 11.5–15.5)
WBC: 4.3 10*3/uL (ref 4.0–10.5)
nRBC: 0 % (ref 0.0–0.2)

## 2018-10-09 LAB — BRAIN NATRIURETIC PEPTIDE: B Natriuretic Peptide: 37 pg/mL (ref 0.0–100.0)

## 2018-10-09 LAB — LACTIC ACID, PLASMA
LACTIC ACID, VENOUS: 6.1 mmol/L — AB (ref 0.5–1.9)
Lactic Acid, Venous: 4.3 mmol/L (ref 0.5–1.9)
Lactic Acid, Venous: 5.4 mmol/L (ref 0.5–1.9)
Lactic Acid, Venous: 5.4 mmol/L (ref 0.5–1.9)

## 2018-10-09 LAB — TSH: TSH: 0.142 u[IU]/mL — AB (ref 0.350–4.500)

## 2018-10-09 LAB — TROPONIN I: Troponin I: <0.03 ng/mL (ref <0.03)

## 2018-10-09 MED ORDER — SODIUM CHLORIDE 0.9 % IV SOLN
INTRAVENOUS | Status: DC
  Administered 2018-10-09 – 2018-10-10 (×5): via INTRAVENOUS

## 2018-10-09 MED ORDER — ONDANSETRON HCL 4 MG PO TABS: 4.0000 mg | ORAL_TABLET | Freq: Four times a day (QID) | ORAL | Status: DC | PRN

## 2018-10-09 MED ORDER — HEPARIN SODIUM (PORCINE) 5000 UNIT/ML IJ SOLN
5000.0000 [IU] | Freq: Three times a day (TID) | INTRAMUSCULAR | Status: DC
  Administered 2018-10-09 – 2018-10-12 (×10): 5000 [IU] via SUBCUTANEOUS
  Filled 2018-10-09 (×11): qty 1

## 2018-10-09 MED ORDER — IPRATROPIUM BROMIDE 0.02 % IN SOLN
0.5000 mg | Freq: Once | RESPIRATORY_TRACT | Status: AC
  Administered 2018-10-09: 0.5 mg via RESPIRATORY_TRACT
  Filled 2018-10-09: qty 2.5

## 2018-10-09 MED ORDER — ACETAMINOPHEN 650 MG RE SUPP: 650.0000 mg | Freq: Four times a day (QID) | RECTAL | Status: DC | PRN

## 2018-10-09 MED ORDER — LACTATED RINGERS IV BOLUS
2000.0000 mL | Freq: Once | INTRAVENOUS | Status: AC
  Administered 2018-10-09: 2000 mL via INTRAVENOUS

## 2018-10-09 MED ORDER — PANTOPRAZOLE SODIUM 40 MG IV SOLR
40.0000 mg | Freq: Two times a day (BID) | INTRAVENOUS | Status: DC
  Administered 2018-10-09 – 2018-10-12 (×7): 40 mg via INTRAVENOUS
  Filled 2018-10-09 (×8): qty 40

## 2018-10-09 MED ORDER — IOHEXOL 300 MG/ML  SOLN
150.0000 mL | Freq: Once | INTRAMUSCULAR | Status: AC | PRN
  Administered 2018-10-09: 150 mL via INTRAVENOUS

## 2018-10-09 MED ORDER — SODIUM CHLORIDE 0.9 % IV BOLUS
1000.0000 mL | Freq: Once | INTRAVENOUS | Status: AC
  Administered 2018-10-09: 1000 mL via INTRAVENOUS

## 2018-10-09 MED ORDER — METHYLPREDNISOLONE SODIUM SUCC 125 MG IJ SOLR
125.0000 mg | Freq: Once | INTRAMUSCULAR | Status: AC
  Administered 2018-10-09: 125 mg via INTRAVENOUS
  Filled 2018-10-09: qty 2

## 2018-10-09 MED ORDER — FLUCONAZOLE IN SODIUM CHLORIDE 400-0.9 MG/200ML-% IV SOLN
400.0000 mg | Freq: Once | INTRAVENOUS | Status: AC
  Administered 2018-10-09: 400 mg via INTRAVENOUS
  Filled 2018-10-09 (×2): qty 200

## 2018-10-09 MED ORDER — FLUCONAZOLE IN SODIUM CHLORIDE 200-0.9 MG/100ML-% IV SOLN
200.0000 mg | INTRAVENOUS | Status: DC
  Administered 2018-10-10 – 2018-10-12 (×3): 200 mg via INTRAVENOUS
  Filled 2018-10-09 (×6): qty 100

## 2018-10-09 MED ORDER — ONDANSETRON HCL 4 MG/2ML IJ SOLN: 4.0000 mg | Freq: Four times a day (QID) | INTRAMUSCULAR | Status: DC | PRN

## 2018-10-09 MED ORDER — ALBUTEROL (5 MG/ML) CONTINUOUS INHALATION SOLN
10.0000 mg/h | INHALATION_SOLUTION | RESPIRATORY_TRACT | Status: DC
  Administered 2018-10-09: 10 mg/h via RESPIRATORY_TRACT
  Filled 2018-10-09: qty 20

## 2018-10-09 MED ORDER — IPRATROPIUM-ALBUTEROL 0.5-2.5 (3) MG/3ML IN SOLN
3.0000 mL | Freq: Four times a day (QID) | RESPIRATORY_TRACT | Status: DC
  Administered 2018-10-09 – 2018-10-10 (×5): 3 mL via RESPIRATORY_TRACT
  Filled 2018-10-09 (×5): qty 3

## 2018-10-09 MED ORDER — ACETAMINOPHEN 325 MG PO TABS: 650.0000 mg | ORAL_TABLET | Freq: Four times a day (QID) | ORAL | Status: DC | PRN

## 2018-10-09 MED ORDER — SODIUM CHLORIDE 0.9 % IV SOLN
3.0000 g | Freq: Four times a day (QID) | INTRAVENOUS | Status: DC
  Administered 2018-10-09 – 2018-10-13 (×14): 3 g via INTRAVENOUS
  Filled 2018-10-09 (×24): qty 3

## 2018-10-09 MED ORDER — METHYLPREDNISOLONE SODIUM SUCC 125 MG IJ SOLR
60.0000 mg | Freq: Two times a day (BID) | INTRAMUSCULAR | Status: DC
  Administered 2018-10-09 – 2018-10-12 (×6): 60 mg via INTRAVENOUS
  Filled 2018-10-09 (×8): qty 2

## 2018-10-09 MED ORDER — SODIUM CHLORIDE 0.9 % IV SOLN
3.0000 g | Freq: Once | INTRAVENOUS | Status: AC
  Administered 2018-10-09: 3 g via INTRAVENOUS
  Filled 2018-10-09: qty 3

## 2018-10-09 NOTE — Progress Notes (Signed)
CRITICAL VALUE ALERT  Critical Value:  Lactic acid 5.4  Date & Time Notied:  3/17 @ 1425.  Provider Notified: Kerry Hough, MD.  Orders Received/Actions taken: Bolus ordered, check vitals and re-check lactic acid level after bolus. Attending RN Johnsie Cancel notified and spoke with MD.

## 2018-10-09 NOTE — ED Notes (Signed)
Per daughter, pt has not been able to eat without choking for the last month.

## 2018-10-09 NOTE — ED Triage Notes (Signed)
Pt is having very loud wheezing. Audible without a stethoscope. Breathing has been this bad for the last 2 weeks per daughter. Expiratory wheezes heard but very diminished breath sounds.

## 2018-10-09 NOTE — ED Notes (Signed)
Receiving breathing treatment 

## 2018-10-09 NOTE — ED Provider Notes (Signed)
[facility] EMERGENCY DEPARTMENT Provider Note   CSN: [id number] Arrival date & time: 10/09/18  4098    History   Chief Complaint Chief Complaint  Patient presents with   Shortness of Breath    HPI Jose Schaefer is a 68 y.o. male.     Pt presents to the ED today with SOB and difficulty eating.  Pt has been sob for the last few weeks.  He has a hx of COPD and has been using his inhalers without relief.  Pt's family member said his difficulty eating has been getting worse over the last few days.  Pt's family member has been pureeing his food, but he is still coughing on it.  He had an appt with GI today, but they cancelled it to bring him here because of his breathing issues.     Past Medical History:  Diagnosis Date   Asthma    COPD (chronic obstructive pulmonary disease) (HCC)    CVA (cerebral vascular accident) (HCC) 2001   [place]   Tobacco abuse     Patient Active Problem List   Diagnosis Date Noted   COPD exacerbation (HCC) 10/09/2018   Cerebral infarction due to unspecified mechanism    CVA (cerebral infarction) 08/11/2015   Cocaine abuse (HCC) 08/11/2015   ARF (acute renal failure) (HCC) 08/11/2015   Acute respiratory failure (HCC) 06/26/2011   COPD (chronic obstructive pulmonary disease) with acute bronchitis (HCC) 06/26/2011   Tobacco abuse    Asthma     History reviewed. No pertinent surgical history.      Home Medications    Prior to Admission medications   Medication Sig Start Date End Date Taking? Authorizing Provider  albuterol (PROVENTIL) (2.5 MG/3ML) 0.083% nebulizer solution Take 3 mLs (2.5 mg total) by nebulization every 6 (six) hours as needed for wheezing or shortness of breath. Patient will need a nebulizer machine. 05/06/18  Yes Donnetta Hutching, MD  albuterol (PROVENTIL HFA;VENTOLIN HFA) 108 (90 Base) MCG/ACT inhaler Inhale 2 puffs into the lungs every 2 (two) hours as needed for wheezing or shortness of breath (cough). Patient  not taking: Reported on 05/06/2018 01/04/17   Gilda Crease, MD  predniSONE (DELTASONE) 10 MG tablet Take 2 tablets (20 mg total) by mouth daily. 08/19/18   Bethann Berkshire, MD    Family History No family history on file.  Social History Social History   Tobacco Use   Smoking status: Current Every Day Smoker    Packs/day: 1.00    Years: 40.00    Pack years: 40.00    Types: Cigarettes   Smokeless tobacco: Never Used  Substance Use Topics   Alcohol use: Not Currently    Alcohol/week: 14.0 standard drinks    Types: 4 Cans of beer, 10 Shots of liquor per week    Comment: as much as i can get, not current per daughter   Drug use: Yes    Types: Cocaine, Marijuana     Allergies   Patient has no known allergies.   Review of Systems Review of Systems  Respiratory: Positive for cough, choking, shortness of breath and wheezing.   Gastrointestinal:       Difficulty swallowing  All other systems reviewed and are negative.    Physical Exam Updated Vital Signs BP (!) 140/91    Pulse 87    Resp 16    Ht  (1.727 m)    Wt 61.2 kg    SpO2 100%    BMI 20.53 kg/m  Physical Exam Vitals signs and nursing note reviewed.  Constitutional:      General: He is not in acute distress.    Appearance: He is cachectic.  HENT:     Head: Normocephalic and atraumatic.     Mouth/Throat:     Mouth: Mucous membranes are dry.     Pharynx: Oropharynx is clear.  Eyes:     Extraocular Movements: Extraocular movements intact.     Pupils: Pupils are equal, round, and reactive to light.  Neck:     Musculoskeletal: Normal range of motion.  Cardiovascular:     Rate and Rhythm: Regular rhythm. Tachycardia present.  Pulmonary:     Effort: Tachypnea and respiratory distress present.     Breath sounds: Wheezing present.  Abdominal:     General: Bowel sounds are normal.     Palpations: Abdomen is soft.  Musculoskeletal: Normal range of motion.  Skin:    General: Skin is warm.      Capillary Refill: Capillary refill takes less than 2 seconds.  Neurological:     General: No focal deficit present.     Mental Status: He is alert and oriented to person, place, and time.  Psychiatric:        Mood and Affect: Mood normal.        Behavior: Behavior normal.      ED Treatments / Results  Labs (all labs ordered are listed, but only abnormal results are displayed) Labs Reviewed  BASIC METABOLIC PANEL - Abnormal; Notable for the following components:      Result Value   Glucose, Bld 129 (*)    All other components within normal limits  CULTURE, BLOOD (ROUTINE X 2)  CULTURE, BLOOD (ROUTINE X 2)  BRAIN NATRIURETIC PEPTIDE  TROPONIN I  CBC WITH DIFFERENTIAL/PLATELET  LACTIC ACID, PLASMA  LACTIC ACID, PLASMA    EKG EKG Interpretation  Date/Time:  Tuesday October 09 2018 09:13:23 EDT Ventricular Rate:  86 PR Interval:    QRS Duration: 88 QT Interval:  390 QTC Calculation: 459 R Axis:   61 Text Interpretation:  Sinus rhythm Ventricular bigeminy Low voltage, precordial leads Anteroseptal infarct, old Nonspecific T abnormalities, lateral leads No significant change since last tracing Confirmed by Jacalyn Lefevre [phone]) on 10/09/2018 10:16:32 AM   Radiology Ct Soft Tissue Neck W Contrast  Result Date: 10/09/2018 CLINICAL DATA:  68 year old male with a history of wheezing and choking EXAM: CT CHEST AND NECK WITH CONTRAST TECHNIQUE: Multidetector CT imaging of the chest and neck was performed during intravenous contrast administration. CONTRAST:  OMNIPAQUE IOHEXOL 300 MG/ML  SOLN COMPARISON:  None. FINDINGS: Neck CT: Pharynx and Larynx: No evidence of mass. Parapharyngeal soft tissues: No abscess or focal fluid. No significant inflammatory changes. The parapharyngeal fat is essentially maintained. Left-sided carotid calcification. Salivary Glands: No mass, stone, or inflammation. Thyroid:  No concerning nodule. Lymph nodes:  No enlarged or necrotic appearing lymph  nodes. Skeleton: Likely chronic nasal fracture. Chronic right-sided maxillary sinus disease with mucoperiosteal thickening and bone changes. Scant mucosal disease of the other visualized sinuses. The patient is edentulous. Chest CT: Cardiovascular: Heart size within normal limits. No significant coronary vascular calcifications. Mild atherosclerotic changes of the thoracic aorta. Aorta measures 3.7 cm in the ascending. No periaortic fluid or inflammatory changes. Branch vessels remain patent. Diameter of the main pulmonary artery 2.3 cm. Mediastinum/Nodes: Unremarkable thoracic inlet. Small mediastinal lymph nodes. The length of the thoracic esophagus appears irregular with circumferential thickening in the upper mediastinum, mid segment,  and just above the GE junction. Questionable irregular mucosal enhancement in the mid segment at the level of the azygos vein. Lungs/Pleura: Centrilobular and paraseptal emphysema with bullous femoris shin at the apices of the lungs. No pneumothorax. No pleural effusion. Mild endotracheal debris just above the carina, with minimal debris in the right mainstem bronchus. Mild bronchial wall thickening. No confluent airspace disease. No interlobular septal thickening. Respiratory motion somewhat limits evaluation. Upper Abdomen: No acute finding of the upper abdomen. Musculoskeletal: No acute displaced fracture. Mild degenerative changes of the thoracic spine. No bony canal narrowing. Focal sclerotic changes at the inferior endplates of T11 and T12 are favored to represent Schmorl's nodes/degenerative change. IMPRESSION: Neck CT: No acute finding. Mild paranasal sinus disease, including chronic changes of the right maxillary sinus. Chest CT: No acute CT finding. Irregular circumferential thickening along the length of the thoracic esophagus, suggesting esophagitis. Correlation with upper GI study and/or endoscopy recommended to rule out underlying malignancy. Advanced paraseptal and  centrilobular emphysema. Mild amount of debris within the distal trachea and right mainstem bronchus, potentially representing aspiration. No evidence of pneumonia. Aortic Atherosclerosis (ICD10-I70.0). Electronically Signed   By: Gilmer Mor D.O.   On: 10/09/2018 12:11   Ct Chest W Contrast  Result Date: 10/09/2018 CLINICAL DATA:  68 year old male with a history of wheezing and choking EXAM: CT CHEST AND NECK WITH CONTRAST TECHNIQUE: Multidetector CT imaging of the chest and neck was performed during intravenous contrast administration. CONTRAST:  OMNIPAQUE IOHEXOL 300 MG/ML  SOLN COMPARISON:  None. FINDINGS: Neck CT: Pharynx and Larynx: No evidence of mass. Parapharyngeal soft tissues: No abscess or focal fluid. No significant inflammatory changes. The parapharyngeal fat is essentially maintained. Left-sided carotid calcification. Salivary Glands: No mass, stone, or inflammation. Thyroid:  No concerning nodule. Lymph nodes:  No enlarged or necrotic appearing lymph nodes. Skeleton: Likely chronic nasal fracture. Chronic right-sided maxillary sinus disease with mucoperiosteal thickening and bone changes. Scant mucosal disease of the other visualized sinuses. The patient is edentulous. Chest CT: Cardiovascular: Heart size within normal limits. No significant coronary vascular calcifications. Mild atherosclerotic changes of the thoracic aorta. Aorta measures 3.7 cm in the ascending. No periaortic fluid or inflammatory changes. Branch vessels remain patent. Diameter of the main pulmonary artery 2.3 cm. Mediastinum/Nodes: Unremarkable thoracic inlet. Small mediastinal lymph nodes. The length of the thoracic esophagus appears irregular with circumferential thickening in the upper mediastinum, mid segment, and just above the GE junction. Questionable irregular mucosal enhancement in the mid segment at the level of the azygos vein. Lungs/Pleura: Centrilobular and paraseptal emphysema with bullous femoris shin  at the apices of the lungs. No pneumothorax. No pleural effusion. Mild endotracheal debris just above the carina, with minimal debris in the right mainstem bronchus. Mild bronchial wall thickening. No confluent airspace disease. No interlobular septal thickening. Respiratory motion somewhat limits evaluation. Upper Abdomen: No acute finding of the upper abdomen. Musculoskeletal: No acute displaced fracture. Mild degenerative changes of the thoracic spine. No bony canal narrowing. Focal sclerotic changes at the inferior endplates of T11 and T12 are favored to represent Schmorl's nodes/degenerative change. IMPRESSION: Neck CT: No acute finding. Mild paranasal sinus disease, including chronic changes of the right maxillary sinus. Chest CT: No acute CT finding. Irregular circumferential thickening along the length of the thoracic esophagus, suggesting esophagitis. Correlation with upper GI study and/or endoscopy recommended to rule out underlying malignancy. Advanced paraseptal and centrilobular emphysema. Mild amount of debris within the distal trachea and right mainstem bronchus, potentially representing aspiration.  No evidence of pneumonia. Aortic Atherosclerosis (ICD10-I70.0). Electronically Signed   By: Gilmer Mor D.O.   On: 10/09/2018 12:11   Dg Chest Port 1 View  Result Date: 10/09/2018 CLINICAL DATA:  Wheezing with shortness of breath for 2 weeks EXAM: PORTABLE CHEST 1 VIEW COMPARISON:  08/19/2018 FINDINGS: Normal heart size. Mild aortic tortuosity. Mild upper mediastinal widening that is stable. There is pending chest CT. There is no edema, consolidation, effusion, or pneumothorax. Mild hyperinflation. IMPRESSION: 1. No evidence of acute disease. 2. COPD. Electronically Signed   By: Marnee Spring M.D.   On: 10/09/2018 09:41    Procedures Procedures (including critical care time)  Medications Ordered in ED Medications  albuterol (PROVENTIL,VENTOLIN) solution continuous neb (0 mg/hr Nebulization  Stopped 10/09/18 1112)  Ampicillin-Sulbactam (UNASYN) 3 g in sodium chloride 0.9 % 100 mL IVPB (has no administration in time range)  sodium chloride 0.9 % bolus 1,000 mL (0 mLs Intravenous Stopped 10/09/18 1111)  ipratropium (ATROVENT) nebulizer solution 0.5 mg (0.5 mg Nebulization Given 10/09/18 0936)  methylPREDNISolone sodium succinate (SOLU-MEDROL) 125 mg/2 mL injection 125 mg (125 mg Intravenous Given 10/09/18 1009)  iohexol (OMNIPAQUE) 300 MG/ML solution 150 mL (150 mLs Intravenous Contrast Given 10/09/18 1044)     Initial Impression / Assessment and Plan / ED Course  I have reviewed the triage vital signs and the nursing notes.  Pertinent labs & imaging results that were available during my care of the patient were reviewed by me and considered in my medical decision making (see chart for details).       Pt's breathing has improved after continuous neb and solumedrol.  However, when he gets up to move, he becomes sob again.  He was placed on 2L oxygen for comfort.  CT chest shows possible aspiration pneumonia which is consistent with his history of difficulty swallowing.  The pt is started on unasyn and blood cultures/lactic acid ordered.  It also shows abnormal thickening of his esophagus.  Esophagitis vs malignancy.  He will need an endoscopy.    CT soft tissue neck shows nothing acute.  Pt d/w Dr. Kerry Hough (triad) for admission.  CRITICAL CARE Performed by: Jacalyn Lefevre   Total critical care time: 30 minutes  Critical care time was exclusive of separately billable procedures and treating other patients.  Critical care was necessary to treat or prevent imminent or life-threatening deterioration.  Critical care was time spent personally by me on the following activities: development of treatment plan with patient and/or surrogate as well as nursing, discussions with consultants, evaluation of patient's response to treatment, examination of patient, obtaining history from patient  or surrogate, ordering and performing treatments and interventions, ordering and review of laboratory studies, ordering and review of radiographic studies, pulse oximetry and re-evaluation of patient's condition.   Final Clinical Impressions(s) / ED Diagnoses   Final diagnoses:  Aspiration pneumonia of right lung, unspecified aspiration pneumonia type, unspecified part of lung (HCC)  COPD exacerbation (HCC)  Dysphagia, unspecified type  Dehydration  Esophagitis    ED Discharge Orders    None       Jacalyn Lefevre, MD 10/09/18 1240

## 2018-10-09 NOTE — ED Notes (Addendum)
Have paged respiratory  

## 2018-10-09 NOTE — Progress Notes (Signed)
Patient's Lactic acid 6.1 Midlevel E-Paged. Vital signs are as follows    10/09/18 1930  Vitals  Temp 97.8 F (36.6 C)  Temp Source Oral  BP 137/84  BP Location Left Arm  BP Method Automatic  Patient Position (if appropriate) Lying  Pulse Rate 66  Resp 20  Oxygen Therapy  SpO2 100 %  O2 Device Nasal Cannula  O2 Flow Rate (L/min) 2 L/min  PAINAD (Pain Assessment in Advanced Dementia)  Breathing 0  Negative Vocalization 0  Facial Expression 0  Body Language 0  Consolability 0  PAINAD Score 0  MEWS Score  MEWS RR 0  MEWS Pulse 0  MEWS Systolic 0  MEWS LOC 0  MEWS Temp 0  MEWS Score 0  MEWS Score Color Green   Normal saline 2000 cc bolus ordered   Will continue to monitor patient.

## 2018-10-09 NOTE — Progress Notes (Signed)
Normal saline bolus 1000 cc administered as ordered and vital signs are as follows.     10/09/18 1622  Vitals  Temp 98 F (36.7 C)  Temp Source Oral  BP 130/86  BP Location Left Arm  BP Method Automatic  Patient Position (if appropriate) Lying  Pulse Rate 76  Resp 20  Oxygen Therapy  SpO2 96 %  O2 Device Nasal Cannula  O2 Flow Rate (L/min) 2 L/min   Will continue to monitor patient

## 2018-10-09 NOTE — ED Notes (Signed)
MD at bedside. Respiratory waiting for orders

## 2018-10-09 NOTE — H&P (Signed)
History and Physical    Jose Schaefer ZOX:[id number] DOB: 02/04/1951 DOA: 10/09/2018  PCP: Duffy RhodyPartridge, Tanillya, FNP  Patient coming from: Home  I have personally briefly reviewed patient[facility]  Chief Complaint: Cough  HPI: Jose Lucksugene Schwebke is a 68 y.o. male with medical history significant of COPD, previous stroke, is brought to the hospital with wheezing, cough shortness of breath.  His family notes that for quite some time now (weeks to months) patient has had difficulty swallowing.  He has coughing episodes after he tries to eat or drink.  This occurs more with solids than liquids, but does also occur with liquids.  This resulted in him wheezing as well.  He has difficult time eating in this state and has had poor p.o. intake.  Family feels that he has lost 15 pounds in the last several months.  He denies any chest pain, vomiting, diarrhea.  He does have nausea.  He is not had any fever.  ED Course: In the emergency room, he is noted to be short of breath and wheezing.  CT of the chest indicates debris in the trachea and right main bronchus indicating aspiration.  Also comments on circumferential thickening of the esophagus possibly related to esophagitis.  Since patient is still short of breath and wheezing, he is been referred for admission.  Review of Systems: As per HPI otherwise 10 point review of systems negative.    Past Medical History:  Diagnosis Date   Asthma    COPD (chronic obstructive pulmonary disease) (HCC)    CVA (cerebral vascular accident) (HCC) 2001   [place]   Tobacco abuse     History reviewed. No pertinent surgical history.  Social History:  reports that he has been smoking cigarettes. He has a 40.00 pack-year smoking history. He has never used smokeless tobacco. He reports previous alcohol use of about 14.0 standard drinks of alcohol per week. He reports current drug use. Drugs: Cocaine and Marijuana.  No Known  Allergies  Family history: Family history reviewed and not pertinent  Prior to Admission medications   Medication Sig Start Date End Date Taking? Authorizing Provider  albuterol (PROVENTIL HFA;VENTOLIN HFA) 108 (90 Base) MCG/ACT inhaler Inhale 2 puffs into the lungs every 2 (two) hours as needed for wheezing or shortness of breath (cough). 01/04/17  Yes Pollina, Canary Brimhristopher J, MD  albuterol (PROVENTIL) (2.5 MG/3ML) 0.083% nebulizer solution Take 3 mLs (2.5 mg total) by nebulization every 6 (six) hours as needed for wheezing or shortness of breath. Patient will need a nebulizer machine. Patient not taking: Reported on 10/09/2018 05/06/18   Donnetta Hutchingook, Brian, MD  predniSONE (DELTASONE) 10 MG tablet Take 2 tablets (20 mg total) by mouth daily. Patient not taking: Reported on 10/09/2018 08/19/18   Bethann BerkshireZammit, Joseph, MD    Physical Exam: Vitals:   10/09/18 1330 10/09/18 1358 10/09/18 1455 10/09/18 1622  BP: (!) 69/61 115/77 (!) 126/91 130/86  Pulse: 90 73 66 76  Resp: 20 (!) 21 20 20   Temp:  97.6 F (36.4 C) (!) 97.2 F (36.2 C) 98 F (36.7 C)  TempSrc:  Oral Oral Oral  SpO2: 100% 100%  96%  Weight: 50.6 kg     Height: 5\' 7"  (1.702 m)       Constitutional: NAD, calm, comfortable, cachectic Eyes: PERRL, lids and conjunctivae normal ENMT: Mucous membranes are moist. Posterior pharynx clear of any exudate or lesions.Normal dentition.  Neck: normal, supple, no masses, no thyromegaly Respiratory: Occasional rhonchi. Normal  respiratory effort. No accessory muscle use.  Cardiovascular: Regular rate and rhythm, no murmurs / rubs / gallops. No extremity edema. 2+ pedal pulses. No carotid bruits.  Abdomen: no tenderness, no masses palpated. No hepatosplenomegaly. Bowel sounds positive.  Musculoskeletal: no clubbing / cyanosis. No joint deformity upper and lower extremities. Good ROM, no contractures. Normal muscle tone.  Skin: no rashes, lesions, ulcers. No induration Neurologic: CN 2-12 grossly intact.  Sensation intact, DTR normal. Strength 5/5 in all 4.  Psychiatric: Normal judgment and insight. Alert and oriented x 3. Normal mood.    Labs on Admission: I have personally reviewed following labs and imaging studies  CBC: Recent Labs  Lab 10/09/18 0937  WBC 4.3  NEUTROABS 2.1  HGB 14.4  HCT 44.6  MCV 87.6  PLT 263   Basic Metabolic Panel: Recent Labs  Lab 10/09/18 0937  NA 142  K 3.7  CL 104  CO2 29  GLUCOSE 129*  BUN 13  CREATININE 1.17  CALCIUM 8.9   GFR: Estimated Creatinine Clearance: 43.2 mL/min (by C-G formula based on SCr of 1.17 mg/dL). Liver Function Tests: No results for input(s): AST, ALT, ALKPHOS, BILITOT, PROT, ALBUMIN in the last 168 hours. No results for input(s): LIPASE, AMYLASE in the last 168 hours. No results for input(s): AMMONIA in the last 168 hours. Coagulation Profile: No results for input(s): INR, PROTIME in the last 168 hours. Cardiac Enzymes: Recent Labs  Lab 10/09/18 0937  TROPONINI <0.03   BNP (last 3 results) No results for input(s): PROBNP in the last 8760 hours. HbA1C: No results for input(s): HGBA1C in the last 72 hours. CBG: No results for input(s): GLUCAP in the last 168 hours. Lipid Profile: No results for input(s): CHOL, HDL, LDLCALC, TRIG, CHOLHDL, LDLDIRECT in the last 72 hours. Thyroid Function Tests: No results for input(s): TSH, T4TOTAL, FREET4, T3FREE, THYROIDAB in the last 72 hours. Anemia Panel: No results for input(s): VITAMINB12, FOLATE, FERRITIN, TIBC, IRON, RETICCTPCT in the last 72 hours. Urine analysis:    Component Value Date/Time   COLORURINE YELLOW 08/19/2018 1334   APPEARANCEUR CLEAR 08/19/2018 1334   LABSPEC 1.010 08/19/2018 1334   PHURINE 7.0 08/19/2018 1334   GLUCOSEU NEGATIVE 08/19/2018 1334   HGBUR NEGATIVE 08/19/2018 1334   BILIRUBINUR NEGATIVE 08/19/2018 1334   KETONESUR NEGATIVE 08/19/2018 1334   PROTEINUR NEGATIVE 08/19/2018 1334   UROBILINOGEN 0.2 06/26/2011 0606   NITRITE NEGATIVE  08/19/2018 1334   LEUKOCYTESUR TRACE (A) 08/19/2018 1334    Radiological Exams on Admission: Ct Soft Tissue Neck W Contrast  Result Date: 10/09/2018 CLINICAL DATA:  68 year old male with a history of wheezing and choking EXAM: CT CHEST AND NECK WITH CONTRAST TECHNIQUE: Multidetector CT imaging of the chest and neck was performed during intravenous contrast administration. CONTRAST:  OMNIPAQUE IOHEXOL 300 MG/ML  SOLN COMPARISON:  None. FINDINGS: Neck CT: Pharynx and Larynx: No evidence of mass. Parapharyngeal soft tissues: No abscess or focal fluid. No significant inflammatory changes. The parapharyngeal fat is essentially maintained. Left-sided carotid calcification. Salivary Glands: No mass, stone, or inflammation. Thyroid:  No concerning nodule. Lymph nodes:  No enlarged or necrotic appearing lymph nodes. Skeleton: Likely chronic nasal fracture. Chronic right-sided maxillary sinus disease with mucoperiosteal thickening and bone changes. Scant mucosal disease of the other visualized sinuses. The patient is edentulous. Chest CT: Cardiovascular: Heart size within normal limits. No significant coronary vascular calcifications. Mild atherosclerotic changes of the thoracic aorta. Aorta measures 3.7 cm in the ascending. No periaortic fluid or inflammatory changes. [employer]  vessels remain patent. Diameter of the main pulmonary artery 2.3 cm. Mediastinum/Nodes: Unremarkable thoracic inlet. Small mediastinal lymph nodes. The length of the thoracic esophagus appears irregular with circumferential thickening in the upper mediastinum, mid segment, and just above the GE junction. Questionable irregular mucosal enhancement in the mid segment at the level of the azygos vein. Lungs/Pleura: Centrilobular and paraseptal emphysema with bullous femoris shin at the apices of the lungs. No pneumothorax. No pleural effusion. Mild endotracheal debris just above the carina, with minimal debris in the right mainstem bronchus.  Mild bronchial wall thickening. No confluent airspace disease. No interlobular septal thickening. Respiratory motion somewhat limits evaluation. Upper Abdomen: No acute finding of the upper abdomen. Musculoskeletal: No acute displaced fracture. Mild degenerative changes of the thoracic spine. No bony canal narrowing. Focal sclerotic changes at the inferior endplates of T11 and T12 are favored to represent Schmorl's nodes/degenerative change. IMPRESSION: Neck CT: No acute finding. Mild paranasal sinus disease, including chronic changes of the right maxillary sinus. Chest CT: No acute CT finding. Irregular circumferential thickening along the length of the thoracic esophagus, suggesting esophagitis. Correlation with upper GI study and/or endoscopy recommended to rule out underlying malignancy. Advanced paraseptal and centrilobular emphysema. Mild amount of debris within the distal trachea and right mainstem bronchus, potentially representing aspiration. No evidence of pneumonia. Aortic Atherosclerosis (ICD10-I70.0). Electronically Signed   By: Gilmer Mor D.O.   On: 10/09/2018 12:11   Ct Chest W Contrast  Result Date: 10/09/2018 CLINICAL DATA:  68 year old male with a history of wheezing and choking EXAM: CT CHEST AND NECK WITH CONTRAST TECHNIQUE: Multidetector CT imaging of the chest and neck was performed during intravenous contrast administration. CONTRAST:  OMNIPAQUE IOHEXOL 300 MG/ML  SOLN COMPARISON:  None. FINDINGS: Neck CT: Pharynx and Larynx: No evidence of mass. Parapharyngeal soft tissues: No abscess or focal fluid. No significant inflammatory changes. The parapharyngeal fat is essentially maintained. Left-sided carotid calcification. Salivary Glands: No mass, stone, or inflammation. Thyroid:  No concerning nodule. Lymph nodes:  No enlarged or necrotic appearing lymph nodes. Skeleton: Likely chronic nasal fracture. Chronic right-sided maxillary sinus disease with mucoperiosteal thickening and  bone changes. Scant mucosal disease of the other visualized sinuses. The patient is edentulous. Chest CT: Cardiovascular: Heart size within normal limits. No significant coronary vascular calcifications. Mild atherosclerotic changes of the thoracic aorta. Aorta measures 3.7 cm in the ascending. No periaortic fluid or inflammatory changes. Branch vessels remain patent. Diameter of the main pulmonary artery 2.3 cm. Mediastinum/Nodes: Unremarkable thoracic inlet. Small mediastinal lymph nodes. The length of the thoracic esophagus appears irregular with circumferential thickening in the upper mediastinum, mid segment, and just above the GE junction. Questionable irregular mucosal enhancement in the mid segment at the level of the azygos vein. Lungs/Pleura: Centrilobular and paraseptal emphysema with bullous femoris shin at the apices of the lungs. No pneumothorax. No pleural effusion. Mild endotracheal debris just above the carina, with minimal debris in the right mainstem bronchus. Mild bronchial wall thickening. No confluent airspace disease. No interlobular septal thickening. Respiratory motion somewhat limits evaluation. Upper Abdomen: No acute finding of the upper abdomen. Musculoskeletal: No acute displaced fracture. Mild degenerative changes of the thoracic spine. No bony canal narrowing. Focal sclerotic changes at the inferior endplates of T11 and T12 are favored to represent Schmorl's nodes/degenerative change. IMPRESSION: Neck CT: No acute finding. Mild paranasal sinus disease, including chronic changes of the right maxillary sinus. Chest CT: No acute CT finding. Irregular circumferential thickening along the length of the thoracic  esophagus, suggesting esophagitis. Correlation with upper GI study and/or endoscopy recommended to rule out underlying malignancy. Advanced paraseptal and centrilobular emphysema. Mild amount of debris within the distal trachea and right mainstem bronchus, potentially representing  aspiration. No evidence of pneumonia. Aortic Atherosclerosis (ICD10-I70.0). Electronically Signed   By: Gilmer Mor D.O.   On: 10/09/2018 12:11   Dg Chest Port 1 View  Result Date: 10/09/2018 CLINICAL DATA:  Wheezing with shortness of breath for 2 weeks EXAM: PORTABLE CHEST 1 VIEW COMPARISON:  08/19/2018 FINDINGS: Normal heart size. Mild aortic tortuosity. Mild upper mediastinal widening that is stable. There is pending chest CT. There is no edema, consolidation, effusion, or pneumothorax. Mild hyperinflation. IMPRESSION: 1. No evidence of acute disease. 2. COPD. Electronically Signed   By: Marnee Spring M.D.   On: 10/09/2018 09:41    EKG: Independently reviewed.  Sinus rhythm without acute changes  Assessment/Plan Active Problems:   Acute respiratory failure (HCC)   COPD exacerbation (HCC)   Dysphagia   Aspiration into airway   Lactic acidosis     1. Acute respiratory failure, likely related to COPD exacerbation.  Symptoms likely precipitated by aspiration from dysphagia.  Continue the patient on supplemental oxygen.  Wean down as tolerated. 2. COPD exacerbation.  Overall wheezing appears to be improving.  Continue on steroids and bronchodilators. 3. Dysphagia with aspiration.  Will request speech therapy evaluation. 4. Possible esophagitis.  Will discuss with gastroenterology to perform EGD once respiratory status has stabilized.  We will start the patient on Diflucan for concerns for underlying candidal esophagitis.  Start the patient on PPI. 5. Lactic acidosis.  Patient has elevated lactate, but does not appear septic or toxic.  Blood pressure stable.  Continue on IV fluids and repeat labs.  DVT prophylaxis: Heparin Code Status: Full code Family Communication: Discussed with daughters at the bedside Disposition Plan: Discharge home once improved Consults called:   Admission status: Inpatient, MedSurg  Erick Blinks MD [facility]   If 7PM-7AM, please contact  night-coverage [email or web address]   10/09/2018, 6:56 PM

## 2018-10-09 NOTE — ED Notes (Signed)
Date and time results received: 10/09/18 1:18 PM  Test: Lactic Acid Critical Value: 4.3  Name of Provider Notified: Dr. York Pellant  Orders Received? Or Actions Taken? See orders

## 2018-10-10 ENCOUNTER — Inpatient Hospital Stay (HOSPITAL_COMMUNITY): Payer: Medicare Other

## 2018-10-10 DIAGNOSIS — E43 Unspecified severe protein-calorie malnutrition: Secondary | ICD-10-CM

## 2018-10-10 DIAGNOSIS — E440 Moderate protein-calorie malnutrition: Secondary | ICD-10-CM

## 2018-10-10 LAB — COMPREHENSIVE METABOLIC PANEL
ALT: 10 U/L (ref 0–44)
AST: 16 U/L (ref 15–41)
Albumin: 3 g/dL — ABNORMAL LOW (ref 3.5–5.0)
Alkaline Phosphatase: 46 U/L (ref 38–126)
Anion gap: 7 (ref 5–15)
BUN: 9 mg/dL (ref 8–23)
CO2: 24 mmol/L (ref 22–32)
Calcium: 8.1 mg/dL — ABNORMAL LOW (ref 8.9–10.3)
Chloride: 112 mmol/L — ABNORMAL HIGH (ref 98–111)
Creatinine, Ser: 0.94 mg/dL (ref 0.61–1.24)
GFR calc Af Amer: >60 mL/min (ref >60)
GFR calc non Af Amer: >60 mL/min (ref >60)
Glucose, Bld: 139 mg/dL — ABNORMAL HIGH (ref 70–99)
POTASSIUM: 3.8 mmol/L (ref 3.5–5.1)
Sodium: 143 mmol/L (ref 135–145)
Total Bilirubin: 0.4 mg/dL (ref 0.3–1.2)
Total Protein: 6.2 g/dL — ABNORMAL LOW (ref 6.5–8.1)

## 2018-10-10 LAB — CBC
HCT: 37.7 % — ABNORMAL LOW (ref 39.0–52.0)
Hemoglobin: 12.3 g/dL — ABNORMAL LOW (ref 13.0–17.0)
MCH: 28.5 pg (ref 26.0–34.0)
MCHC: 32.6 g/dL (ref 30.0–36.0)
MCV: 87.3 fL (ref 80.0–100.0)
Platelets: 211 10*3/uL (ref 150–400)
RBC: 4.32 MIL/uL (ref 4.22–5.81)
RDW: 14.5 % (ref 11.5–15.5)
WBC: 7.3 10*3/uL (ref 4.0–10.5)
nRBC: 0 % (ref 0.0–0.2)

## 2018-10-10 LAB — LACTIC ACID, PLASMA
Lactic Acid, Venous: 4.1 mmol/L (ref 0.5–1.9)
Lactic Acid, Venous: 2.6 mmol/L (ref 0.5–1.9)

## 2018-10-10 LAB — HIV ANTIBODY (ROUTINE TESTING W REFLEX): HIV Screen 4th Generation wRfx: NONREACTIVE

## 2018-10-10 MED ORDER — RESOURCE THICKENUP CLEAR PO POWD
ORAL | Status: DC | PRN
  Filled 2018-10-10: qty 125

## 2018-10-10 MED ORDER — ALBUTEROL SULFATE (2.5 MG/3ML) 0.083% IN NEBU: 2.5000 mg | INHALATION_SOLUTION | Freq: Four times a day (QID) | RESPIRATORY_TRACT | Status: DC | PRN

## 2018-10-10 MED ORDER — IPRATROPIUM-ALBUTEROL 0.5-2.5 (3) MG/3ML IN SOLN
3.0000 mL | Freq: Three times a day (TID) | RESPIRATORY_TRACT | Status: DC
  Administered 2018-10-11 – 2018-10-13 (×8): 3 mL via RESPIRATORY_TRACT
  Filled 2018-10-10 (×8): qty 3

## 2018-10-10 NOTE — Progress Notes (Signed)
CRITICAL VALUE ALERT  Critical Value:  Lactic acid 2.6  Date & Time Notied:  10/10/2018 0846  Provider Notified: Dr. Kerry Hough  Orders Received/Actions taken: awaiting orders/callback

## 2018-10-10 NOTE — Progress Notes (Signed)
Lactic 4.1 notified t opyd of critical via amion page system. Trending down. Will await orders

## 2018-10-10 NOTE — Progress Notes (Signed)
PROGRESS NOTE    Jose Schaefer  QQV:[id number] DOB: February 05, 1951 DOA: 10/09/2018 PCP: Duffy Rhody, FNP    Brief Narrative:  68 year old male with a history of COPD, previous stroke, was brought to the hospital with cough, shortness of breath and wheezing.  His family notes that he had been aspirating while eating and drinking for the last several weeks to months.  Due to his inability to eat, he has been losing weight.  He was admitted to the hospital and started on treatment for COPD exacerbation.  CT chest indicated circumferential esophageal thickening.  GI has been consulted.  He is also being followed by speech therapy for dysphagia.   Assessment & Plan:   Active Problems:   Acute respiratory failure (HCC)   COPD exacerbation (HCC)   Dysphagia   Aspiration into airway   Lactic acidosis   Malnutrition of moderate degree   1. Acute respiratory failure with hypoxia.  Related to COPD exacerbation.  Symptoms precipitated by aspiration from dysphagia.  Continue to wean off oxygen as tolerated. 2. COPD exacerbation.  Overall wheezing has improved.  Continue current treatments with steroids and bronchodilators. 3. Dysphagia with aspiration.  Seen by speech therapy and started on pured diet with nectar thick liquids. 4. Esophagitis/abnormal findings on chest CT.  Will consult gastroenterology to request possible EGD.  Continue on Diflucan for concerns for underlying candidal esophagitis.  Continue on PPI. 5. Lactic acidosis.  Serum lactate has trended down with IV fluids.  He is not septic or toxic.  Continue to follow.   DVT prophylaxis: Heparin Code Status: Full code Family Communication: Discussed with daughters at bedside Disposition Plan: Discharge home once work-up is complete   Consultants:     Procedures:     Antimicrobials:   Unasyn 3/17 >   Subjective: Feeling better today.  Less coughing.  Wheezing is better.  Objective: Vitals:   10/10/18 0550 10/10/18  0724 10/10/18 1327 10/10/18 1434  BP: (!) 125/91  (!) 145/89   Pulse: 85  (!) 47   Resp: 16  16   Temp: 97.9 F (36.6 C)  98.3 F (36.8 C)   TempSrc: Oral     SpO2: 100% 96% 100% 99%  Weight:      Height:        Intake/Output Summary (Last 24 hours) at 10/10/2018 1922 Last data filed at 10/10/2018 1700 Gross per 24 hour  Intake 5507.41 ml  Output 2900 ml  Net 2607.41 ml   Filed Weights   10/09/18 0912 10/09/18 1330  Weight: 61.2 kg 50.6 kg    Examination:  General exam: Appears calm and comfortable  Respiratory system: Clear to auscultation. Respiratory effort normal. Cardiovascular system: S1 & S2 heard, RRR. No JVD, murmurs, rubs, gallops or clicks. No pedal edema. Gastrointestinal system: Abdomen is nondistended, soft and nontender. No organomegaly or masses felt. Normal bowel sounds heard. Central nervous system: Alert and oriented. No focal neurological deficits. Extremities: Symmetric 5 x 5 power. Skin: No rashes, lesions or ulcers Psychiatry: Judgement and insight appear normal. Mood & affect appropriate.     Data Reviewed: I have personally reviewed following labs and imaging studies  CBC: Recent Labs  Lab 10/09/18 0937 10/10/18 0707  WBC 4.3 7.3  NEUTROABS 2.1  --   HGB 14.4 12.3*  HCT 44.6 37.7*  MCV 87.6 87.3  PLT 263 211   Basic Metabolic Panel: Recent Labs  Lab 10/09/18 0937 10/10/18 0707  NA 142 143  K 3.7 3.8  CL 104  112*  CO2 29 24  GLUCOSE 129* 139*  BUN 13 9  CREATININE 1.17 0.94  CALCIUM 8.9 8.1*   GFR: Estimated Creatinine Clearance: 53.8 mL/min (by C-G formula based on SCr of 0.94 mg/dL). Liver Function Tests: Recent Labs  Lab 10/10/18 0707  AST 16  ALT 10  ALKPHOS 46  BILITOT 0.4  PROT 6.2*  ALBUMIN 3.0*   No results for input(s): LIPASE, AMYLASE in the last 168 hours. No results for input(s): AMMONIA in the last 168 hours. Coagulation Profile: No results for input(s): INR, PROTIME in the last 168 hours. Cardiac  Enzymes: Recent Labs  Lab 10/09/18 0937  TROPONINI <0.03   BNP (last 3 results) No results for input(s): PROBNP in the last 8760 hours. HbA1C: No results for input(s): HGBA1C in the last 72 hours. CBG: No results for input(s): GLUCAP in the last 168 hours. Lipid Profile: No results for input(s): CHOL, HDL, LDLCALC, TRIG, CHOLHDL, LDLDIRECT in the last 72 hours. Thyroid Function Tests: Recent Labs    10/09/18 1814  TSH 0.142*   Anemia Panel: No results for input(s): VITAMINB12, FOLATE, FERRITIN, TIBC, IRON, RETICCTPCT in the last 72 hours. Sepsis Labs: Recent Labs  Lab 10/09/18 1814 10/09/18 2248 10/10/18 0202 10/10/18 0707  LATICACIDVEN 6.1* 5.4* 4.1* 2.6*    Recent Results (from the past 240 hour(s))  Culture, blood (routine x 2)     Status: None (Preliminary result)   Collection Time: 10/09/18 12:40 PM  Result Value Ref Range Status   Specimen Description BLOOD LEFT ARM  Final   Special Requests   Final    BOTTLES DRAWN AEROBIC ONLY Blood Culture results may not be optimal due to an inadequate volume of blood received in culture bottles   Culture   Final    NO GROWTH < 24 HOURS Performed at [facility], [address] [place], [place] [address]    Report Status PENDING  Incomplete  Culture, blood (routine x 2)     Status: None (Preliminary result)   Collection Time: 10/09/18 12:52 PM  Result Value Ref Range Status   Specimen Description BLOOD RIGHT HAND  Final   Special Requests   Final    BOTTLES DRAWN AEROBIC AND ANAEROBIC Blood Culture results may not be optimal due to an inadequate volume of blood received in culture bottles   Culture   Final    NO GROWTH < 24 HOURS Performed at [facility], [address] [place], [place] [address]    Report Status PENDING  Incomplete         Radiology Studies: Ct Soft Tissue Neck W Contrast  Result Date: 10/09/2018 CLINICAL DATA:  68 year old male with a history of wheezing and choking EXAM: CT CHEST AND NECK  WITH CONTRAST TECHNIQUE: Multidetector CT imaging of the chest and neck was performed during intravenous contrast administration. CONTRAST:  OMNIPAQUE IOHEXOL 300 MG/ML  SOLN COMPARISON:  None. FINDINGS: Neck CT: Pharynx and Larynx: No evidence of mass. Parapharyngeal soft tissues: No abscess or focal fluid. No significant inflammatory changes. The parapharyngeal fat is essentially maintained. Left-sided carotid calcification. Salivary Glands: No mass, stone, or inflammation. Thyroid:  No concerning nodule. Lymph nodes:  No enlarged or necrotic appearing lymph nodes. Skeleton: Likely chronic nasal fracture. Chronic right-sided maxillary sinus disease with mucoperiosteal thickening and bone changes. Scant mucosal disease of the other visualized sinuses. The patient is edentulous. Chest CT: Cardiovascular: Heart size within normal limits. No significant coronary vascular calcifications. Mild atherosclerotic changes of the thoracic  aorta. Aorta measures 3.7 cm in the ascending. No periaortic fluid or inflammatory changes. Branch vessels remain patent. Diameter of the main pulmonary artery 2.3 cm. Mediastinum/Nodes: Unremarkable thoracic inlet. Small mediastinal lymph nodes. The length of the thoracic esophagus appears irregular with circumferential thickening in the upper mediastinum, mid segment, and just above the GE junction. Questionable irregular mucosal enhancement in the mid segment at the level of the azygos vein. Lungs/Pleura: Centrilobular and paraseptal emphysema with bullous femoris shin at the apices of the lungs. No pneumothorax. No pleural effusion. Mild endotracheal debris just above the carina, with minimal debris in the right mainstem bronchus. Mild bronchial wall thickening. No confluent airspace disease. No interlobular septal thickening. Respiratory motion somewhat limits evaluation. Upper Abdomen: No acute finding of the upper abdomen. Musculoskeletal: No acute displaced fracture. Mild  degenerative changes of the thoracic spine. No bony canal narrowing. Focal sclerotic changes at the inferior endplates of T11 and T12 are favored to represent Schmorl's nodes/degenerative change. IMPRESSION: Neck CT: No acute finding. Mild paranasal sinus disease, including chronic changes of the right maxillary sinus. Chest CT: No acute CT finding. Irregular circumferential thickening along the length of the thoracic esophagus, suggesting esophagitis. Correlation with upper GI study and/or endoscopy recommended to rule out underlying malignancy. Advanced paraseptal and centrilobular emphysema. Mild amount of debris within the distal trachea and right mainstem bronchus, potentially representing aspiration. No evidence of pneumonia. Aortic Atherosclerosis (ICD10-I70.0). Electronically Signed   By: Gilmer Mor D.O.   On: 10/09/2018 12:11   Ct Chest W Contrast  Result Date: 10/09/2018 CLINICAL DATA:  68 year old male with a history of wheezing and choking EXAM: CT CHEST AND NECK WITH CONTRAST TECHNIQUE: Multidetector CT imaging of the chest and neck was performed during intravenous contrast administration. CONTRAST:  OMNIPAQUE IOHEXOL 300 MG/ML  SOLN COMPARISON:  None. FINDINGS: Neck CT: Pharynx and Larynx: No evidence of mass. Parapharyngeal soft tissues: No abscess or focal fluid. No significant inflammatory changes. The parapharyngeal fat is essentially maintained. Left-sided carotid calcification. Salivary Glands: No mass, stone, or inflammation. Thyroid:  No concerning nodule. Lymph nodes:  No enlarged or necrotic appearing lymph nodes. Skeleton: Likely chronic nasal fracture. Chronic right-sided maxillary sinus disease with mucoperiosteal thickening and bone changes. Scant mucosal disease of the other visualized sinuses. The patient is edentulous. Chest CT: Cardiovascular: Heart size within normal limits. No significant coronary vascular calcifications. Mild atherosclerotic changes of the thoracic  aorta. Aorta measures 3.7 cm in the ascending. No periaortic fluid or inflammatory changes. Branch vessels remain patent. Diameter of the main pulmonary artery 2.3 cm. Mediastinum/Nodes: Unremarkable thoracic inlet. Small mediastinal lymph nodes. The length of the thoracic esophagus appears irregular with circumferential thickening in the upper mediastinum, mid segment, and just above the GE junction. Questionable irregular mucosal enhancement in the mid segment at the level of the azygos vein. Lungs/Pleura: Centrilobular and paraseptal emphysema with bullous femoris shin at the apices of the lungs. No pneumothorax. No pleural effusion. Mild endotracheal debris just above the carina, with minimal debris in the right mainstem bronchus. Mild bronchial wall thickening. No confluent airspace disease. No interlobular septal thickening. Respiratory motion somewhat limits evaluation. Upper Abdomen: No acute finding of the upper abdomen. Musculoskeletal: No acute displaced fracture. Mild degenerative changes of the thoracic spine. No bony canal narrowing. Focal sclerotic changes at the inferior endplates of T11 and T12 are favored to represent Schmorl's nodes/degenerative change. IMPRESSION: Neck CT: No acute finding. Mild paranasal sinus disease, including chronic changes of the right maxillary sinus.  Chest CT: No acute CT finding. Irregular circumferential thickening along the length of the thoracic esophagus, suggesting esophagitis. Correlation with upper GI study and/or endoscopy recommended to rule out underlying malignancy. Advanced paraseptal and centrilobular emphysema. Mild amount of debris within the distal trachea and right mainstem bronchus, potentially representing aspiration. No evidence of pneumonia. Aortic Atherosclerosis (ICD10-I70.0). Electronically Signed   By: Gilmer Mor D.O.   On: 10/09/2018 12:11   Dg Chest Port 1 View  Result Date: 10/09/2018 CLINICAL DATA:  Wheezing with shortness of breath  for 2 weeks EXAM: PORTABLE CHEST 1 VIEW COMPARISON:  08/19/2018 FINDINGS: Normal heart size. Mild aortic tortuosity. Mild upper mediastinal widening that is stable. There is pending chest CT. There is no edema, consolidation, effusion, or pneumothorax. Mild hyperinflation. IMPRESSION: 1. No evidence of acute disease. 2. COPD. Electronically Signed   By: Marnee Spring M.D.   On: 10/09/2018 09:41        Scheduled Meds:  heparin  5,000 Units Subcutaneous Q8H   ipratropium-albuterol  3 mL Nebulization Q6H   methylPREDNISolone (SOLU-MEDROL) injection  60 mg Intravenous Q12H   pantoprazole (PROTONIX) IV  40 mg Intravenous Q12H   Continuous Infusions:  sodium chloride 110 mL/hr at 10/10/18 1011   ampicillin-sulbactam (UNASYN) IV 3 g (10/10/18 1559)   fluconazole (DIFLUCAN) IV 200 mg (10/10/18 1801)     LOS: 1 day    Time spent:    Erick Blinks, MD [facility]   If 7PM-7AM, please contact night-coverage [email or web address]  10/10/2018, 7:22 PM

## 2018-10-10 NOTE — Progress Notes (Addendum)
Initial Nutrition Assessment  DOCUMENTATION CODES:   Non-severe (moderate) malnutrition in context of chronic illness, Underweight  INTERVENTION:  Provided handout: Suggestions of increasing calories and protein   Family member has questions about whether pt will be discharged on D1/NTL diet  Magic cup TID with meals, each supplement provides 290 kcal and 9 grams of protein   NUTRITION DIAGNOSIS:   Moderate Malnutrition related to chronic illness(COPD -chronic alchohol/ tobacco abuse) as evidenced by estimated needs, mild fat depletion, mild muscle depletion.   GOAL:  Patient will meet greater than or equal to 90% of their needs MONITOR:   Supplement acceptance, PO intake, Labs, Weight trends REASON FOR ASSESSMENT:   Malnutrition Screening Tool    ASSESSMENT: Patient is a 68 yo underweight  male with history of COPD, CVA, Tobacco and alcohol abuse. He presents with shortness of breath and difficulty consuming po's.   Patients usual home diet is regular. History of mod/severe oral phase dysphagia per ST assessment 07/2015 -> acute CVA.   Following assessment by ST today-Dysphagia 1 diet with nectar thick liquids-initiated. -MBSS completed today. His appetite is good. Lunch tray observed 100% consumed and family members who are present affirms pt has a good appetite and usually eats three meals daily. He is edentulous.    The past week he has started on Ensure Plus ([employer] brand) and he likes these. Recommend he consume 2 daily if cleared by ST to consume Nectar-like consistency. Able to feed himself. In acute setting will send Magic cups TID.  Usual weight range reported at 120 lb (54.5 kg) and says he has always been a slim person. Currently wt is 50.6 kg which is down 6%. Mild to moderate fat/ muscle loss.     Medications reviewed and include:  Protonix, prednisone   Labs: BMP Latest Ref Rng & Units 10/10/2018 10/09/2018 08/19/2018  Glucose 70 - 99 mg/dL 008(Q) 761(P) 509(T)   BUN 8 - 23 mg/dL 9 13 14   Creatinine 0.61 - 1.24 mg/dL 2.67 1.24 5.80(D)  Sodium 135 - 145 mmol/L 143 142 142  Potassium 3.5 - 5.1 mmol/L 3.8 3.7 3.9  Chloride 98 - 111 mmol/L 112(H) 104 108  CO2 22 - 32 mmol/L 24 29 28   Calcium 8.9 - 10.3 mg/dL 8.1(L) 8.9 8.8(L)     NUTRITION - FOCUSED PHYSICAL EXAM: mild to moderate muscle and fat loss   Diet Order:   Diet Order            DIET - DYS 1 Room service appropriate? Yes; Fluid consistency: Nectar Thick  Diet effective now              EDUCATION NEEDS:   Education needs have been addressed Skin:  Skin Assessment: Reviewed RN Assessment  Last BM:  3/16  Height:   Ht Readings from Last 1 Encounters:  10/09/18 5\' 7"  (1.702 m)    Weight:   Wt Readings from Last 1 Encounters:  10/09/18 50.6 kg    Ideal Body Weight:  67 kg  BMI:  Body mass index is 17.47 kg/m.  Estimated Nutritional Needs:   Kcal:  1785-2040 (35-40 kcal/kg/bw)  Protein:  86-97 (1.7-1.9 gr/kg/bw)  Fluid:  >1500 ml daily   Royann Shivers MS,RD,CSG,LDN Office: [phone] Pager: [phone]

## 2018-10-10 NOTE — Progress Notes (Signed)
Modified Barium Swallow Progress Note  Patient Details  Name: Jose Schaefer MRN: [id number] Date of Birth: 01-19-1951  Today's Date: 10/10/2018  Modified Barium Swallow completed.  Full report located under Chart Review in the Imaging Section.  Brief recommendations include the following:  Clinical Impression  Pt presents with moderate oropharyngeal phase dysphagia characterized by impaired lingual manipulation resulting in reduced bolus cohesiveness, piecemeal deglutition, and premature spillage; pharyngeal phase is characterized by delay in swallow initiation and reduced laryngeal closure resulting in penetration and aspiration during the swallow with thin liquids (cough but not removed) between the arytenoids. Pt with penetration of nectars, but expelled before aspiration. Chin tuck was trialed and found to be effective when provided max cues from SLP (tsp thin with cues for chin tuck), he was not able to implement independently. Pt with prolonged oral transit with solid textures and delays in the valleculae for up to 7 seconds. Pt with questionable pharyngocele near valleculae which retained barium during the study. Recommend D1/puree with NTL via cup sips, no straws, and supervision with meals. Pt can likely be upgraded to soft textures after caregiver education. No family present today and SLP made phone call, however voicemail box was full. SLP to follow.   Swallow Evaluation Recommendations       SLP Diet Recommendations: Dysphagia 1 (Puree) solids;Nectar thick liquid   Liquid Administration via: Cup;No straw   Medication Administration: Crushed with puree   Supervision: Patient able to self feed;Full supervision/cueing for compensatory strategies   Compensations: Slow rate   Postural Changes: Remain semi-upright after after feeds/meals (Comment);Seated upright at 90 degrees   Oral Care Recommendations: Oral care BID;Staff/trained caregiver to provide oral care   Other  Recommendations: Order thickener from pharmacy;Prohibited food (jello, ice cream, thin soups);Clarify dietary restrictions   Thank you,  Havery Moros, CCC-SLP [phone]  PORTER,DABNEY 10/10/2018,12:32 PM

## 2018-10-10 NOTE — Progress Notes (Signed)
CRITICAL VALUE ALERT  Critical Value: 4.1  Date & Time Notied: 10/10/2018 0256   Provider Notified: Marga Melnick, MD  Orders Received/Actions taken: awaiting new orders

## 2018-10-11 ENCOUNTER — Telehealth: Payer: Self-pay | Admitting: Gastroenterology

## 2018-10-11 DIAGNOSIS — R933 Abnormal findings on diagnostic imaging of other parts of digestive tract: Secondary | ICD-10-CM

## 2018-10-11 LAB — BASIC METABOLIC PANEL
Anion gap: 5 (ref 5–15)
BUN: 14 mg/dL (ref 8–23)
CO2: 26 mmol/L (ref 22–32)
Calcium: 8.6 mg/dL — ABNORMAL LOW (ref 8.9–10.3)
Chloride: 115 mmol/L — ABNORMAL HIGH (ref 98–111)
Creatinine, Ser: 1.01 mg/dL (ref 0.61–1.24)
GFR calc Af Amer: >60 mL/min (ref >60)
GFR calc non Af Amer: >60 mL/min (ref >60)
Glucose, Bld: 139 mg/dL — ABNORMAL HIGH (ref 70–99)
POTASSIUM: 3.7 mmol/L (ref 3.5–5.1)
Sodium: 146 mmol/L — ABNORMAL HIGH (ref 135–145)

## 2018-10-11 LAB — LACTIC ACID, PLASMA: Lactic Acid, Venous: 1.3 mmol/L (ref 0.5–1.9)

## 2018-10-11 NOTE — Progress Notes (Signed)
  Speech Language Pathology Treatment: Dysphagia  Patient Details Name: Jose Schaefer MRN: [id number] DOB: Jun 06, 1951 Today's Date: 10/11/2018 Time: 8546-2703 SLP Time Calculation (min) (ACUTE ONLY): 22 min  Assessment / Plan / Recommendation Clinical Impression  Pt seen at bedside for ongoing dysphagia intervention. Upon entering the room, Pt was received reclined in bed, self feeding, wheezing, and coughing. SLP immediately removed tray, elevated head of bed, repositioned nasal cannula into nares, alerted RN, and obtained O2 sats. Suspect aspiration event with straw sips of NTL in reclined position. SLP provided education to staff and Pt for recommendations for puree and NTL via spoon or cup sips (no straws) and only when sitting upright. Pt will need 100% supervision for meals due to cognitive deficits and impulsivity.    HPI HPI: Jose Schaefer is a 68 y.o. male with medical history significant of COPD, previous stroke, is brought to the hospital with wheezing, cough shortness of breath.  His family notes that for quite some time now (weeks to months) patient has had difficulty swallowing.  He has coughing episodes after he tries to eat or drink.  This occurs more with solids than liquids, but does also occur with liquids.  This resulted in him wheezing as well.  He has difficult time eating in this state and has had poor p.o. intake.  Family feels that he has lost 15 pounds in the last several months.  He denies any chest pain, vomiting, diarrhea.  He does have nausea.  He is not had any fever. BSE/MBS requested and SLP opted to complete MBSS due to h/o of dysphagia and PNA.      SLP Plan  Continue with current plan of care       Recommendations  Diet recommendations: Dysphagia 1 (puree);Nectar-thick liquid Liquids provided via: Cup;No straw;Teaspoon Medication Administration: Whole meds with puree Supervision: Patient able to self feed;Full supervision/cueing for compensatory  strategies Compensations: Slow rate;Small sips/bites Postural Changes and/or Swallow Maneuvers: Seated upright 90 degrees;Upright 30-60 min after meal                Oral Care Recommendations: Oral care BID;Staff/trained caregiver to provide oral care Follow up Recommendations: 24 hour supervision/assistance SLP Visit Diagnosis: Dysphagia, oropharyngeal phase (R13.12) Plan: Continue with current plan of care       Thank you,  Jose Schaefer, CCC-SLP [phone]                 PORTER,DABNEY 10/11/2018, 4:00 PM

## 2018-10-11 NOTE — Progress Notes (Signed)
PROGRESS NOTE    Jose Schaefer  ZOX:[id number] DOB: [birth year] DOA: 10/09/2018 PCP: Jose Rhody, FNP    Brief Narrative:  68 year old male with a history of COPD, previous stroke, was brought to the hospital with cough, shortness of breath and wheezing.  His family notes that he had been aspirating while eating and drinking for the last several weeks to months.  Due to his inability to eat, he has been losing weight.  He was admitted to the hospital and started on treatment for COPD exacerbation.  CT chest indicated circumferential esophageal thickening.  GI has been consulted.  He is also being followed by speech therapy for dysphagia.   Assessment & Plan:   Active Problems:   Acute respiratory failure (HCC)   COPD exacerbation (HCC)   Dysphagia   Aspiration into airway   Lactic acidosis   Malnutrition of moderate degree   Abnormal CT scan, esophagus   1. Acute respiratory failure with hypoxia.  Related to COPD exacerbation.  Symptoms precipitated by aspiration from dysphagia.  Continue to wean off oxygen as tolerated. 2. COPD exacerbation.  He had more wheezing after his episode of aspiration earlier today, but this is since resolved.  Continue current treatments.. 3. Dysphagia with aspiration.  Seen by speech therapy and started on pured diet with nectar thick liquids. 4. Esophagitis/abnormal findings on chest CT.  Will consult gastroenterology to request possible EGD.  Continue on Diflucan for concerns for underlying candidal esophagitis.  Continue on PPI. 5. Lactic acidosis.  Serum lactate has trended down with IV fluids.  He is not septic or toxic.  Continue to follow.   DVT prophylaxis: Heparin Code Status: Full code Family Communication: Discussed with daughters at bedside Disposition Plan: Discharge home once work-up is complete   Consultants:     Procedures:     Antimicrobials:   Unasyn 3/17 >   Subjective: Patient had an episode earlier today while  he was trying to eat and drink and may have had an episode of aspiration.  Patient was not supervised at that time.  Objective: Vitals:   10/11/18 0756 10/11/18 1308 10/11/18 1314 10/11/18 1353  BP:    137/75  Pulse:    (!) 59  Resp:    18  Temp:    97.9 F (36.6 C)  TempSrc:    Oral  SpO2: 92% (!) 89% 95% 100%  Weight:      Height:        Intake/Output Summary (Last 24 hours) at 10/11/2018 1917 Last data filed at 10/11/2018 1700 Gross per 24 hour  Intake 660 ml  Output 3900 ml  Net -3240 ml   Filed Weights   10/09/18 0912 10/09/18 1330  Weight: 61.2 kg 50.6 kg    Examination:  General exam: Alert, awake, oriented x 3 Respiratory system: Clear to auscultation. Respiratory effort normal. Cardiovascular system:RRR. No murmurs, rubs, gallops. Gastrointestinal system: Abdomen is nondistended, soft and nontender. No organomegaly or masses felt. Normal bowel sounds heard. Central nervous system: Alert and oriented. No focal neurological deficits. Extremities: No C/C/E, +pedal pulses Skin: No rashes, lesions or ulcers Psychiatry: Judgement and insight appear normal. Mood & affect appropriate.     Data Reviewed: I have personally reviewed following labs and imaging studies  CBC: Recent Labs  Lab 10/09/18 0937 10/10/18 0707  WBC 4.3 7.3  NEUTROABS 2.1  --   HGB 14.4 12.3*  HCT 44.6 37.7*  MCV 87.6 87.3  PLT 263 211   Basic Metabolic Panel: Recent  Labs  Lab 10/09/18 0937 10/10/18 0707 10/11/18 0450  NA 142 143 146*  K 3.7 3.8 3.7  CL 104 112* 115*  CO2 29 24 26   GLUCOSE 129* 139* 139*  BUN 13 9 14   CREATININE 1.17 0.94 1.01  CALCIUM 8.9 8.1* 8.6*   GFR: Estimated Creatinine Clearance: 50.1 mL/min (by C-G formula based on SCr of 1.01 mg/dL). Liver Function Tests: Recent Labs  Lab 10/10/18 0707  AST 16  ALT 10  ALKPHOS 46  BILITOT 0.4  PROT 6.2*  ALBUMIN 3.0*   No results for input(s): LIPASE, AMYLASE in the last 168 hours. No results for  input(s): AMMONIA in the last 168 hours. Coagulation Profile: No results for input(s): INR, PROTIME in the last 168 hours. Cardiac Enzymes: Recent Labs  Lab 10/09/18 0937  TROPONINI <0.03   BNP (last 3 results) No results for input(s): PROBNP in the last 8760 hours. HbA1C: No results for input(s): HGBA1C in the last 72 hours. CBG: No results for input(s): GLUCAP in the last 168 hours. Lipid Profile: No results for input(s): CHOL, HDL, LDLCALC, TRIG, CHOLHDL, LDLDIRECT in the last 72 hours. Thyroid Function Tests: Recent Labs    10/09/18 1814  TSH 0.142*   Anemia Panel: No results for input(s): VITAMINB12, FOLATE, FERRITIN, TIBC, IRON, RETICCTPCT in the last 72 hours. Sepsis Labs: Recent Labs  Lab 10/09/18 2248 10/10/18 0202 10/10/18 0707 10/11/18 0450  LATICACIDVEN 5.4* 4.1* 2.6* 1.3    Recent Results (from the past 240 hour(s))  Culture, blood (routine x 2)     Status: None (Preliminary result)   Collection Time: 10/09/18 12:40 PM  Result Value Ref Range Status   Specimen Description BLOOD LEFT ARM  Final   Special Requests   Final    BOTTLES DRAWN AEROBIC ONLY Blood Culture results may not be optimal due to an inadequate volume of blood received in culture bottles   Culture   Final    NO GROWTH 2 DAYS Performed at [facility], [address], [place], [place] [address]    Report Status PENDING  Incomplete  Culture, blood (routine x 2)     Status: None (Preliminary result)   Collection Time: 10/09/18 12:52 PM  Result Value Ref Range Status   Specimen Description BLOOD RIGHT HAND  Final   Special Requests   Final    BOTTLES DRAWN AEROBIC AND ANAEROBIC Blood Culture results may not be optimal due to an inadequate volume of blood received in culture bottles   Culture   Final    NO GROWTH 2 DAYS Performed at [facility], [address], [place], [place] [address]    Report Status PENDING  Incomplete         Radiology Studies: Dg Swallowing Func-speech  Pathology  Result Date: 10/10/2018 Objective Swallowing Evaluation: Type of Study: MBS-Modified Barium Swallow Study  Patient Details Name: Jose Schaefer MRN: [id number] Date of Birth: [birth year] Today's Date: 10/10/2018 Time: SLP Start Time (ACUTE ONLY): 1100 -SLP Stop Time (ACUTE ONLY): 1130 SLP Time Calculation (min) (ACUTE ONLY): 30 min Past Medical History: Past Medical History: Diagnosis Date  Asthma   COPD (chronic obstructive pulmonary disease) (HCC)   CVA (cerebral vascular accident) (HCC) 2001  [place]  Tobacco abuse  Past Surgical History: No past surgical history on file. HPI: Adden Hollenbach is a 68 y.o. male with medical history significant of COPD, previous stroke, is brought to the hospital with wheezing, cough shortness of breath.  His family notes that for quite some  PROGRESS NOTE    Jose Schaefer  ZOX:[id number] DOB: [birth year] DOA: 10/09/2018 PCP: Jose Rhody, FNP    Brief Narrative:  68 year old male with a history of COPD, previous stroke, was brought to the hospital with cough, shortness of breath and wheezing.  His family notes that he had been aspirating while eating and drinking for the last several weeks to months.  Due to his inability to eat, he has been losing weight.  He was admitted to the hospital and started on treatment for COPD exacerbation.  CT chest indicated circumferential esophageal thickening.  GI has been consulted.  He is also being followed by speech therapy for dysphagia.   Assessment & Plan:   Active Problems:   Acute respiratory failure (HCC)   COPD exacerbation (HCC)   Dysphagia   Aspiration into airway   Lactic acidosis   Malnutrition of moderate degree   Abnormal CT scan, esophagus   1. Acute respiratory failure with hypoxia.  Related to COPD exacerbation.  Symptoms precipitated by aspiration from dysphagia.  Continue to wean off oxygen as tolerated. 2. COPD exacerbation.  He had more wheezing after his episode of aspiration earlier today, but this is since resolved.  Continue current treatments.. 3. Dysphagia with aspiration.  Seen by speech therapy and started on pured diet with nectar thick liquids. 4. Esophagitis/abnormal findings on chest CT.  Will consult gastroenterology to request possible EGD.  Continue on Diflucan for concerns for underlying candidal esophagitis.  Continue on PPI. 5. Lactic acidosis.  Serum lactate has trended down with IV fluids.  He is not septic or toxic.  Continue to follow.   DVT prophylaxis: Heparin Code Status: Full code Family Communication: Discussed with daughters at bedside Disposition Plan: Discharge home once work-up is complete   Consultants:     Procedures:     Antimicrobials:   Unasyn 3/17 >   Subjective: Patient had an episode earlier today while  he was trying to eat and drink and may have had an episode of aspiration.  Patient was not supervised at that time.  Objective: Vitals:   10/11/18 0756 10/11/18 1308 10/11/18 1314 10/11/18 1353  BP:    137/75  Pulse:    (!) 59  Resp:    18  Temp:    97.9 F (36.6 C)  TempSrc:    Oral  SpO2: 92% (!) 89% 95% 100%  Weight:      Height:        Intake/Output Summary (Last 24 hours) at 10/11/2018 1917 Last data filed at 10/11/2018 1700 Gross per 24 hour  Intake 660 ml  Output 3900 ml  Net -3240 ml   Filed Weights   10/09/18 0912 10/09/18 1330  Weight: 61.2 kg 50.6 kg    Examination:  General exam: Alert, awake, oriented x 3 Respiratory system: Clear to auscultation. Respiratory effort normal. Cardiovascular system:RRR. No murmurs, rubs, gallops. Gastrointestinal system: Abdomen is nondistended, soft and nontender. No organomegaly or masses felt. Normal bowel sounds heard. Central nervous system: Alert and oriented. No focal neurological deficits. Extremities: No C/C/E, +pedal pulses Skin: No rashes, lesions or ulcers Psychiatry: Judgement and insight appear normal. Mood & affect appropriate.     Data Reviewed: I have personally reviewed following labs and imaging studies  CBC: Recent Labs  Lab 10/09/18 0937 10/10/18 0707  WBC 4.3 7.3  NEUTROABS 2.1  --   HGB 14.4 12.3*  HCT 44.6 37.7*  MCV 87.6 87.3  PLT 263 211   Basic Metabolic Panel: Recent  Labs  Lab 10/09/18 0937 10/10/18 0707 10/11/18 0450  NA 142 143 146*  K 3.7 3.8 3.7  CL 104 112* 115*  CO2 29 24 26   GLUCOSE 129* 139* 139*  BUN 13 9 14   CREATININE 1.17 0.94 1.01  CALCIUM 8.9 8.1* 8.6*   GFR: Estimated Creatinine Clearance: 50.1 mL/min (by C-G formula based on SCr of 1.01 mg/dL). Liver Function Tests: Recent Labs  Lab 10/10/18 0707  AST 16  ALT 10  ALKPHOS 46  BILITOT 0.4  PROT 6.2*  ALBUMIN 3.0*   No results for input(s): LIPASE, AMYLASE in the last 168 hours. No results for  input(s): AMMONIA in the last 168 hours. Coagulation Profile: No results for input(s): INR, PROTIME in the last 168 hours. Cardiac Enzymes: Recent Labs  Lab 10/09/18 0937  TROPONINI <0.03   BNP (last 3 results) No results for input(s): PROBNP in the last 8760 hours. HbA1C: No results for input(s): HGBA1C in the last 72 hours. CBG: No results for input(s): GLUCAP in the last 168 hours. Lipid Profile: No results for input(s): CHOL, HDL, LDLCALC, TRIG, CHOLHDL, LDLDIRECT in the last 72 hours. Thyroid Function Tests: Recent Labs    10/09/18 1814  TSH 0.142*   Anemia Panel: No results for input(s): VITAMINB12, FOLATE, FERRITIN, TIBC, IRON, RETICCTPCT in the last 72 hours. Sepsis Labs: Recent Labs  Lab 10/09/18 2248 10/10/18 0202 10/10/18 0707 10/11/18 0450  LATICACIDVEN 5.4* 4.1* 2.6* 1.3    Recent Results (from the past 240 hour(s))  Culture, blood (routine x 2)     Status: None (Preliminary result)   Collection Time: 10/09/18 12:40 PM  Result Value Ref Range Status   Specimen Description BLOOD LEFT ARM  Final   Special Requests   Final    BOTTLES DRAWN AEROBIC ONLY Blood Culture results may not be optimal due to an inadequate volume of blood received in culture bottles   Culture   Final    NO GROWTH 2 DAYS Performed at [facility], [address], [place], [place] [address]    Report Status PENDING  Incomplete  Culture, blood (routine x 2)     Status: None (Preliminary result)   Collection Time: 10/09/18 12:52 PM  Result Value Ref Range Status   Specimen Description BLOOD RIGHT HAND  Final   Special Requests   Final    BOTTLES DRAWN AEROBIC AND ANAEROBIC Blood Culture results may not be optimal due to an inadequate volume of blood received in culture bottles   Culture   Final    NO GROWTH 2 DAYS Performed at [facility], [address], [place], [place] [address]    Report Status PENDING  Incomplete         Radiology Studies: Dg Swallowing Func-speech  Pathology  Result Date: 10/10/2018 Objective Swallowing Evaluation: Type of Study: MBS-Modified Barium Swallow Study  Patient Details Name: Jose Schaefer MRN: [id number] Date of Birth: [birth year] Today's Date: 10/10/2018 Time: SLP Start Time (ACUTE ONLY): 1100 -SLP Stop Time (ACUTE ONLY): 1130 SLP Time Calculation (min) (ACUTE ONLY): 30 min Past Medical History: Past Medical History: Diagnosis Date  Asthma   COPD (chronic obstructive pulmonary disease) (HCC)   CVA (cerebral vascular accident) (HCC) 2001  [place]  Tobacco abuse  Past Surgical History: No past surgical history on file. HPI: Adden Hollenbach is a 68 y.o. male with medical history significant of COPD, previous stroke, is brought to the hospital with wheezing, cough shortness of breath.  His family notes that for quite some

## 2018-10-11 NOTE — Telephone Encounter (Signed)
Spoke with pt daughter to cancel apt for 10/15/18 pt is currently admitted at [facility] and is under there GI care

## 2018-10-11 NOTE — Progress Notes (Signed)
Called to patient room by RN. Thinks patient may have aspirated. Audible expiratory wheezes and sp02 89% pm 4LNC. Breathing treatment given and will place on a salter HFNC. RT will continue to monitor patient.

## 2018-10-11 NOTE — Consult Note (Signed)
Referring Provider: Erick Blinks, MD Primary Care Physician:  Duffy Rhody, FNP Primary Gastroenterologist:  Jonette Eva, MD  Reason for Consultation: Abnormal esophagus on CT  HPI: Jose Schaefer is a 68 y.o. male with COPD, previous stroke brought to the emergency department with complaints of wheezing, cough, shortness of breath. We have been asked to evaluate patient for abnormal esophagus on CT and consider EGD.   Family noted for several weeks to months that he was having difficulty swallowing.  He has coughing episode after he tries to eat or drink.  Coughing occurs more with solids than liquids.  Subsequently has had a lot of wheezing.  Difficulty eating and is lost about 15 pounds in the past few months. Patient denies abdominal pain. No bowel concerns. No melena, brbpr.   CT chest and neck showed irregular circumferential thickening along the length of the thoracic esophagus, suggesting esophagitis but cannot rule out underlying malignancy, advanced paraseptal and centrilobular emphysema, mild amount of debris within the distal trachea and right mainstem bronchus, potentially presenting aspiration.  No evidence of pneumonia.  He had formal modified barium swallow study yesterday showing moderate oropharyngeal phase dysphagia, abnormal pharyngeal phase as well with moderate aspiration risk and risk for inadequate nutrition/hydration.  Recommended dysphagia 1 solids, nectar thick liquids, crushed medications with pure.  Patient still having frequent coughing and audible wheezing. Can barely complete a sentence without coughing.    Prior to Admission medications   Medication Sig Start Date End Date Taking? Authorizing Provider  albuterol (PROVENTIL HFA;VENTOLIN HFA) 108 (90 Base) MCG/ACT inhaler Inhale 2 puffs into the lungs every 2 (two) hours as needed for wheezing or shortness of breath (cough). 01/04/17  Yes Pollina, Canary Brim, MD  albuterol (PROVENTIL) (2.5 MG/3ML) 0.083%  nebulizer solution Take 3 mLs (2.5 mg total) by nebulization every 6 (six) hours as needed for wheezing or shortness of breath. Patient will need a nebulizer machine. Patient not taking: Reported on 10/09/2018 05/06/18   Donnetta Hutching, MD  predniSONE (DELTASONE) 10 MG tablet Take 2 tablets (20 mg total) by mouth daily. Patient not taking: Reported on 10/09/2018 08/19/18   Bethann Berkshire, MD    Current Facility-Administered Medications  Medication Dose Route Frequency Provider Last Rate Last Dose  . 0.9 %  sodium chloride infusion   Intravenous Continuous Opyd, Lavone Neri, MD 110 mL/hr at 10/10/18 2030    . acetaminophen (TYLENOL) tablet 650 mg  650 mg Oral Q6H PRN Erick Blinks, MD       Or  . acetaminophen (TYLENOL) suppository 650 mg  650 mg Rectal Q6H PRN Erick Blinks, MD      . albuterol (PROVENTIL) (2.5 MG/3ML) 0.083% nebulizer solution 2.5 mg  2.5 mg Nebulization Q6H PRN Erick Blinks, MD      . Ampicillin-Sulbactam (UNASYN) 3 g in sodium chloride 0.9 % 100 mL IVPB  3 g Intravenous Q6H Erick Blinks, MD 200 mL/hr at 10/11/18 0333 3 g at 10/11/18 0333  . fluconazole (DIFLUCAN) IVPB 200 mg  200 mg Intravenous Q24H Erick Blinks, MD 100 mL/hr at 10/10/18 1801 200 mg at 10/10/18 1801  . heparin injection 5,000 Units  5,000 Units Subcutaneous Q8H Erick Blinks, MD   5,000 Units at 10/11/18 0535  . ipratropium-albuterol (DUONEB) 0.5-2.5 (3) MG/3ML nebulizer solution 3 mL  3 mL Nebulization TID Erick Blinks, MD   3 mL at 10/11/18 0756  . methylPREDNISolone sodium succinate (SOLU-MEDROL) 125 mg/2 mL injection 60 mg  60 mg Intravenous Q12H Erick Blinks, MD  60 mg at 10/10/18 2119  . ondansetron (ZOFRAN) tablet 4 mg  4 mg Oral Q6H PRN Erick Blinks, MD       Or  . ondansetron (ZOFRAN) injection 4 mg  4 mg Intravenous Q6H PRN Erick Blinks, MD      . pantoprazole (PROTONIX) injection 40 mg  40 mg Intravenous Q12H Erick Blinks, MD   40 mg at 10/10/18 2127  . Resource ThickenUp  Clear   Oral PRN Erick Blinks, MD        Allergies as of 10/09/2018  . (No Known Allergies)    Past Medical History:  Diagnosis Date  . Asthma   . COPD (chronic obstructive pulmonary disease) (HCC)   . CVA (cerebral vascular accident) [facility]) 2001   [place]  . Tobacco abuse     History reviewed. No pertinent surgical history.  History reviewed. No pertinent family history.  Social History   Socioeconomic History  . Marital status: Single    Spouse name: Not on file  . Number of children: Not on file  . Years of education: Not on file  . Highest education level: Not on file  Occupational History  . Not on file  Social Needs  . Financial resource strain: Not on file  . Food insecurity:    Worry: Not on file    Inability: Not on file  . Transportation needs:    Medical: Not on file    Non-medical: Not on file  Tobacco Use  . Smoking status: Current Every Day Smoker    Packs/day: 1.00    Years: 40.00    Pack years: 40.00    Types: Cigarettes  . Smokeless tobacco: Never Used  Substance and Sexual Activity  . Alcohol use: Not Currently    Alcohol/week: 14.0 standard drinks    Types: 4 Cans of beer, 10 Shots of liquor per week    Comment: as much as i can get, not current per daughter  . Drug use: Yes    Types: Cocaine, Marijuana  . Sexual activity: Yes    Birth control/protection: None  Lifestyle  . Physical activity:    Days per week: Not on file    Minutes per session: Not on file  . Stress: Not on file  Relationships  . Social connections:    Talks on phone: Not on file    Gets together: Not on file    Attends religious service: Not on file    Active member of club or organization: Not on file    Attends meetings of clubs or organizations: Not on file    Relationship status: Not on file  . Intimate partner violence:    Fear of current or ex partner: Not on file    Emotionally abused: Not on file    Physically abused: Not on file    Forced sexual  activity: Not on file  Other Topics Concern  . Not on file  Social History Narrative  . Not on file     ROS:  General: Negative for anorexia, fever, chills, fatigue, weakness. See hpi Eyes: Negative for vision changes.  ENT: Negative for hoarseness, difficulty swallowing , nasal congestion. CV: Negative for chest pain, angina, palpitations, dyspnea on exertion, peripheral edema.  Respiratory: Negative for dyspnea at rest, dyspnea on exertion, +cough, sputum,+ wheezing.  GI: See history of present illness. GU:  Negative for dysuria, hematuria, urinary incontinence, urinary frequency, nocturnal urination.  MS: Negative for joint pain, low back pain.  Derm: Negative  for rash or itching.  Neuro: Negative for weakness, abnormal sensation, seizure, frequent headaches, memory loss, confusion.  Psych: Negative for anxiety, depression, suicidal ideation, hallucinations.  Endo: Negative for unusual weight change.  Heme: Negative for bruising or bleeding. Allergy: Negative for rash or hives.       Physical Examination: Vital signs in last 24 hours: Temp:  [97.8 F (36.6 C)-98.3 F (36.8 C)] 97.9 F (36.6 C) (03/19 0713) Pulse Rate:  [47-110] 110 (03/19 0713) Resp:  [16-20] 20 (03/19 0713) BP: (129-159)/(81-89) 159/81 (03/19 0713) SpO2:  [72 %-100 %] 92 % (03/19 0756) Last BM Date: (pt unable to state)  General: Thin male in no acute distress. Frequent cough/wheezing.  Head: Normocephalic, atraumatic.   Eyes: Conjunctiva pink, no icterus. Mouth: Oropharyngeal mucosa moist and pink , no lesions erythema or exudate. Neck: Supple without thyromegaly, masses, or lymphadenopathy.  Lungs: Clear to bilaterally wheezing.  Heart: Regular rate and rhythm, no murmurs rubs or gallops.  Abdomen: Bowel sounds are normal, nontender, nondistended, no hepatosplenomegaly or masses, no abdominal bruits or    hernia , no rebound or guarding.   Rectal: not performed Extremities: No lower extremity  edema, clubbing, deformity.  Neuro: Alert and oriented x 4 , grossly normal neurologically.  Skin: Warm and dry, no rash or jaundice.   Psych: Alert and cooperative, normal mood and affect.        Intake/Output from previous day: 03/18 0701 - 03/19 0700 In: 440 [P.O.:240; IV Piggyback:200] Out: 3400 [Urine:3400] Intake/Output this shift: No intake/output data recorded.  Lab Results: CBC Recent Labs    10/09/18 0937 10/10/18 0707  WBC 4.3 7.3  HGB 14.4 12.3*  HCT 44.6 37.7*  MCV 87.6 87.3  PLT 263 211   BMET Recent Labs    10/09/18 0937 10/10/18 0707 10/11/18 0450  NA 142 143 146*  K 3.7 3.8 3.7  CL 104 112* 115*  CO2 29 24 26   GLUCOSE 129* 139* 139*  BUN 13 9 14   CREATININE 1.17 0.94 1.01  CALCIUM 8.9 8.1* 8.6*   LFT Recent Labs    10/10/18 0707  BILITOT 0.4  ALKPHOS 46  AST 16  ALT 10  PROT 6.2*  ALBUMIN 3.0*    Lipase No results for input(s): LIPASE in the last 72 hours.  PT/INR No results for input(s): LABPROT, INR in the last 72 hours.    Imaging Studies: Ct Soft Tissue Neck W Contrast  Result Date: 10/09/2018 CLINICAL DATA:  68 year old male with a history of wheezing and choking EXAM: CT CHEST AND NECK WITH CONTRAST TECHNIQUE: Multidetector CT imaging of the chest and neck was performed during intravenous contrast administration. CONTRAST:  150mL OMNIPAQUE IOHEXOL 300 MG/ML  SOLN COMPARISON:  None. FINDINGS: Neck CT: Pharynx and Larynx: No evidence of mass. Parapharyngeal soft tissues: No abscess or focal fluid. No significant inflammatory changes. The parapharyngeal fat is essentially maintained. Left-sided carotid calcification. Salivary Glands: No mass, stone, or inflammation. Thyroid:  No concerning nodule. Lymph nodes:  No enlarged or necrotic appearing lymph nodes. Skeleton: Likely chronic nasal fracture. Chronic right-sided maxillary sinus disease with mucoperiosteal thickening and bone changes. Scant mucosal disease of the other visualized  sinuses. The patient is edentulous. Chest CT: Cardiovascular: Heart size within normal limits. No significant coronary vascular calcifications. Mild atherosclerotic changes of the thoracic aorta. Aorta measures 3.7 cm in the ascending. No periaortic fluid or inflammatory changes. Branch vessels remain patent. Diameter of the main pulmonary artery 2.3 cm. Mediastinum/Nodes: Unremarkable thoracic inlet. Small  mediastinal lymph nodes. The length of the thoracic esophagus appears irregular with circumferential thickening in the upper mediastinum, mid segment, and just above the GE junction. Questionable irregular mucosal enhancement in the mid segment at the level of the azygos vein. Lungs/Pleura: Centrilobular and paraseptal emphysema with bullous femoris shin at the apices of the lungs. No pneumothorax. No pleural effusion. Mild endotracheal debris just above the carina, with minimal debris in the right mainstem bronchus. Mild bronchial wall thickening. No confluent airspace disease. No interlobular septal thickening. Respiratory motion somewhat limits evaluation. Upper Abdomen: No acute finding of the upper abdomen. Musculoskeletal: No acute displaced fracture. Mild degenerative changes of the thoracic spine. No bony canal narrowing. Focal sclerotic changes at the inferior endplates of T11 and T12 are favored to represent Schmorl's nodes/degenerative change. IMPRESSION: Neck CT: No acute finding. Mild paranasal sinus disease, including chronic changes of the right maxillary sinus. Chest CT: No acute CT finding. Irregular circumferential thickening along the length of the thoracic esophagus, suggesting esophagitis. Correlation with upper GI study and/or endoscopy recommended to rule out underlying malignancy. Advanced paraseptal and centrilobular emphysema. Mild amount of debris within the distal trachea and right mainstem bronchus, potentially representing aspiration. No evidence of pneumonia. Aortic Atherosclerosis  (ICD10-I70.0). Electronically Signed   By: Gilmer Mor D.O.   On: 10/09/2018 12:11   Ct Chest W Contrast  Result Date: 10/09/2018 CLINICAL DATA:  68 year old male with a history of wheezing and choking EXAM: CT CHEST AND NECK WITH CONTRAST TECHNIQUE: Multidetector CT imaging of the chest and neck was performed during intravenous contrast administration. CONTRAST:  OMNIPAQUE IOHEXOL 300 MG/ML  SOLN COMPARISON:  None. FINDINGS: Neck CT: Pharynx and Larynx: No evidence of mass. Parapharyngeal soft tissues: No abscess or focal fluid. No significant inflammatory changes. The parapharyngeal fat is essentially maintained. Left-sided carotid calcification. Salivary Glands: No mass, stone, or inflammation. Thyroid:  No concerning nodule. Lymph nodes:  No enlarged or necrotic appearing lymph nodes. Skeleton: Likely chronic nasal fracture. Chronic right-sided maxillary sinus disease with mucoperiosteal thickening and bone changes. Scant mucosal disease of the other visualized sinuses. The patient is edentulous. Chest CT: Cardiovascular: Heart size within normal limits. No significant coronary vascular calcifications. Mild atherosclerotic changes of the thoracic aorta. Aorta measures 3.7 cm in the ascending. No periaortic fluid or inflammatory changes. Branch vessels remain patent. Diameter of the main pulmonary artery 2.3 cm. Mediastinum/Nodes: Unremarkable thoracic inlet. Small mediastinal lymph nodes. The length of the thoracic esophagus appears irregular with circumferential thickening in the upper mediastinum, mid segment, and just above the GE junction. Questionable irregular mucosal enhancement in the mid segment at the level of the azygos vein. Lungs/Pleura: Centrilobular and paraseptal emphysema with bullous femoris shin at the apices of the lungs. No pneumothorax. No pleural effusion. Mild endotracheal debris just above the carina, with minimal debris in the right mainstem bronchus. Mild bronchial wall  thickening. No confluent airspace disease. No interlobular septal thickening. Respiratory motion somewhat limits evaluation. Upper Abdomen: No acute finding of the upper abdomen. Musculoskeletal: No acute displaced fracture. Mild degenerative changes of the thoracic spine. No bony canal narrowing. Focal sclerotic changes at the inferior endplates of T11 and T12 are favored to represent Schmorl's nodes/degenerative change. IMPRESSION: Neck CT: No acute finding. Mild paranasal sinus disease, including chronic changes of the right maxillary sinus. Chest CT: No acute CT finding. Irregular circumferential thickening along the length of the thoracic esophagus, suggesting esophagitis. Correlation with upper GI study and/or endoscopy recommended to rule out underlying malignancy.  Advanced paraseptal and centrilobular emphysema. Mild amount of debris within the distal trachea and right mainstem bronchus, potentially representing aspiration. No evidence of pneumonia. Aortic Atherosclerosis (ICD10-I70.0). Electronically Signed   By: Gilmer Mor D.O.   On: 10/09/2018 12:11   Dg Chest Port 1 View  Result Date: 10/09/2018 CLINICAL DATA:  Wheezing with shortness of breath for 2 weeks EXAM: PORTABLE CHEST 1 VIEW COMPARISON:  08/19/2018 FINDINGS: Normal heart size. Mild aortic tortuosity. Mild upper mediastinal widening that is stable. There is pending chest CT. There is no edema, consolidation, effusion, or pneumothorax. Mild hyperinflation. IMPRESSION: 1. No evidence of acute disease. 2. COPD. Electronically Signed   By: Marnee Spring M.D.   On: 10/09/2018 09:41   Dg Swallowing Func-speech Pathology  Result Date: 10/10/2018 Objective Swallowing Evaluation: Type of Study: MBS-Modified Barium Swallow Study  Patient Details Name: Yosniel Spragg MRN: [id number] Date of Birth: [birth year] Today's Date: 10/10/2018 Time: SLP Start Time (ACUTE ONLY): 1100 -SLP Stop Time (ACUTE ONLY): 1130 SLP Time Calculation (min) (ACUTE ONLY): 30  min Past Medical History: Past Medical History: Diagnosis Date . Asthma  . COPD (chronic obstructive pulmonary disease) (HCC)  . CVA (cerebral vascular accident) [facility]) 2001  [place] . Tobacco abuse  Past Surgical History: No past surgical history on file. HPI: Meiko Marohn is a 68 y.o. male with medical history significant of COPD, previous stroke, is brought to the hospital with wheezing, cough shortness of breath.  His family notes that for quite some time now (weeks to months) patient has had difficulty swallowing.  He has coughing episodes after he tries to eat or drink.  This occurs more with solids than liquids, but does also occur with liquids.  This resulted in him wheezing as well.  He has difficult time eating in this state and has had poor p.o. intake.  Family feels that he has lost 15 pounds in the last several months.  He denies any chest pain, vomiting, diarrhea.  He does have nausea.  He is not had any fever. BSE/MBS requested and SLP opted to complete MBSS due to h/o of dysphagia and PNA.  Subjective: "Alright! Assessment / Plan / Recommendation CHL IP CLINICAL IMPRESSIONS 10/10/2018 Clinical Impression Pt presents with moderate oropharyngeal phase dysphagia characterized by impaired lingual manipulation resulting in reduced bolus cohesiveness, piecemeal deglutition, and premature spillage; pharyngeal phase is characterized by delay in swallow initiation and reduced laryngeal closure resulting in penetration and aspiration during the swallow with thin liquids (cough but not removed) between the arytenoids. Pt with penetration of nectars, but expelled before aspiration. Chin tuck was trialed and found to be effective when provided max cues from SLP (tsp thin with cues for chin tuck), he was not able to implement independently. Pt with prolonged oral transit with solid textures and delays in the valleculae for up to 7 seconds. Pt with questionable pharyngocele near valleculae which retained barium  during the study. Recommend D1/puree with NTL via cup sips, no straws, and supervision with meals. Pt can likely be upgraded to soft textures after caregiver education. No family present today and SLP made phone call, however voicemail box was full. SLP to follow. SLP Visit Diagnosis Dysphagia, oropharyngeal phase (R13.12) Attention and concentration deficit following -- Frontal lobe and executive function deficit following -- Impact on safety and function Moderate aspiration risk;Risk for inadequate nutrition/hydration   CHL IP TREATMENT RECOMMENDATION 10/10/2018 Treatment Recommendations Therapy as outlined in treatment plan below   Prognosis 10/10/2018 Prognosis for Safe Diet Advancement Fair  Barriers to Reach Goals Severity of deficits Barriers/Prognosis Comment -- CHL IP DIET RECOMMENDATION 10/10/2018 SLP Diet Recommendations Dysphagia 1 (Puree) solids;Nectar thick liquid Liquid Administration via Cup;No straw Medication Administration Crushed with puree Compensations Slow rate Postural Changes Remain semi-upright after after feeds/meals (Comment);Seated upright at 90 degrees   CHL IP OTHER RECOMMENDATIONS 10/10/2018 Recommended Consults -- Oral Care Recommendations Oral care BID;Staff/trained caregiver to provide oral care Other Recommendations Order thickener from pharmacy;Prohibited food (jello, ice cream, thin soups);Clarify dietary restrictions   CHL IP FOLLOW UP RECOMMENDATIONS 10/10/2018 Follow up Recommendations 24 hour supervision/assistance   CHL IP FREQUENCY AND DURATION 10/10/2018 Speech Therapy Frequency (ACUTE ONLY) min 2x/week Treatment Duration 1 week      CHL IP ORAL PHASE 10/10/2018 Oral Phase Impaired Oral - Pudding Teaspoon -- Oral - Pudding Cup -- Oral - Honey Teaspoon Decreased bolus cohesion;Premature spillage Oral - Honey Cup -- Oral - Nectar Teaspoon Decreased bolus cohesion;Premature spillage Oral - Nectar Cup Decreased bolus cohesion;Premature spillage Oral - Nectar Straw -- Oral - Thin  Teaspoon Decreased bolus cohesion;Premature spillage Oral - Thin Cup Decreased bolus cohesion;Premature spillage Oral - Thin Straw NT Oral - Puree Delayed oral transit;Decreased bolus cohesion Oral - Mech Soft -- Oral - Regular Delayed oral transit;Decreased bolus cohesion;Impaired mastication;Weak lingual manipulation;Piecemeal swallowing Oral - Multi-Consistency -- Oral - Pill Reduced posterior propulsion;Delayed oral transit Oral Phase - Comment --  CHL IP PHARYNGEAL PHASE 10/10/2018 Pharyngeal Phase Impaired Pharyngeal- Pudding Teaspoon -- Pharyngeal -- Pharyngeal- Pudding Cup -- Pharyngeal -- Pharyngeal- Honey Teaspoon Delayed swallow initiation-vallecula Pharyngeal -- Pharyngeal- Honey Cup -- Pharyngeal -- Pharyngeal- Nectar Teaspoon Delayed swallow initiation-vallecula;Penetration/Aspiration during swallow;Pharyngeal residue - valleculae;Pharyngeal residue - pyriform Pharyngeal Material enters airway, remains ABOVE vocal cords then ejected out;Material does not enter airway Pharyngeal- Nectar Cup Delayed swallow initiation-vallecula;Delayed swallow initiation-pyriform sinuses;Penetration/Aspiration during swallow;Pharyngeal residue - valleculae;Pharyngeal residue - pyriform Pharyngeal Material does not enter airway;Material enters airway, remains ABOVE vocal cords then ejected out Pharyngeal- Nectar Straw -- Pharyngeal -- Pharyngeal- Thin Teaspoon Delayed swallow initiation-vallecula;Delayed swallow initiation-pyriform sinuses;Penetration/Aspiration during swallow;Reduced airway/laryngeal closure;Trace aspiration;Pharyngeal residue - valleculae;Pharyngeal residue - pyriform;Inter-arytenoid space residue;Compensatory strategies attempted (with notebox) Pharyngeal Material enters airway, passes BELOW cords and not ejected out despite cough attempt by patient;Material enters airway, remains ABOVE vocal cords then ejected out Pharyngeal- Thin Cup NT Pharyngeal -- Pharyngeal- Thin Straw -- Pharyngeal -- Pharyngeal-  Puree Delayed swallow initiation-vallecula Pharyngeal -- Pharyngeal- Mechanical Soft -- Pharyngeal -- Pharyngeal- Regular Delayed swallow initiation-vallecula;Pharyngeal residue - valleculae Pharyngeal -- Pharyngeal- Multi-consistency -- Pharyngeal -- Pharyngeal- Pill Pharyngeal residue - valleculae Pharyngeal -- Pharyngeal Comment --  CHL IP CERVICAL ESOPHAGEAL PHASE 10/10/2018 Cervical Esophageal Phase WFL Pudding Teaspoon -- Pudding Cup -- Honey Teaspoon -- Honey Cup -- Nectar Teaspoon -- Nectar Cup -- Nectar Straw -- Thin Teaspoon -- Thin Cup -- Thin Straw -- Puree -- Mechanical Soft -- Regular -- Multi-consistency -- Pill -- Cervical Esophageal Comment -- Thank you, Havery Moros, CCC-SLP [phone] PORTER,DABNEY 10/10/2018, 8:56 PM             [4 week]   Impression: Pleasant 68 year old gentleman presents emergency department with complaints of cough, wheezing particularly related to mealtime.  Subsequent weight loss due to difficulties eating.  CT showed moderate amount of debris within the distal trachea and right mainstem bronchus, potentially representing aspiration.  Evidence of emphysema but no pneumonia.  Noted to have oropharyngeal dysphagia, at increased risk of aspiration, recommended pured diet, nectar thick liquids.  We were asked to evaluate patient for abnormal  appearing esophagus on chest CT.  There was irregular circumferential thickening along the length of the thoracic esophagus.  This could be esophagitis either infectious versus reflux related.  Cannot exclude underlying malignancy.  Patient continues to have significant wheezing and coughing.  Currently would hold off on endoscopic evaluation/sedation until he has improved from a respiratory standpoint.  Patient has a history of significant alcohol consumption but not currently.  Consider deep sedation with help of anesthesiology.  Plan: 1. Consider upper endoscopy when patient has improved from a respiratory standpoint. Will  need to hold heparin prior to procedure. Consider propofol.  2. Continue PPI BID.  3. Aspiration precautions/diet recommendations per ST.  We would like to thank you for the opportunity to participate in the care of Jermar Colter.  Leanna Battles. Dixon Boos [facility] [phone] 3/19/20201:25 PM     LOS: 2 days

## 2018-10-12 DIAGNOSIS — K209 Esophagitis, unspecified without bleeding: Secondary | ICD-10-CM

## 2018-10-12 DIAGNOSIS — R933 Abnormal findings on diagnostic imaging of other parts of digestive tract: Secondary | ICD-10-CM

## 2018-10-12 DIAGNOSIS — J690 Pneumonitis due to inhalation of food and vomit: Secondary | ICD-10-CM

## 2018-10-12 MED ORDER — METHYLPREDNISOLONE SODIUM SUCC 125 MG IJ SOLR
60.0000 mg | Freq: Every day | INTRAMUSCULAR | Status: DC
  Filled 2018-10-12: qty 2

## 2018-10-12 NOTE — TOC Initial Note (Signed)
Transition of Care [facility]) - Initial/Assessment Note    Patient Details  Name: Jose Schaefer MRN: [id number] Date of Birth: March 30, 1951  Transition of Care [facility]) CM/SW Contact:    Jose Rogue, LCSW Phone Number: 10/12/2018, 11:57 AM  Clinical Narrative:    68 YO AA male lives in [place] with daughter and her boyfriend. States he came to the hospital due to feeling "weak," and that he ambulates without assistance and is able to do all ADL's. Confirms that he uses no O2 at home despite being on nasal cannula here, and that he gets no other services in the home. Spends his day watching TV-says daughter cooks for him-makes sure he has food when she is at work during the day. He requested that I get an appointment with PCP as he currently has none. Called daughter with pt's permission who states information given by patient is accurate, with exception of statement about PCP.  She states he sees Jose Schaefer in [place], and appreciated my offer to get him appointment there.  Furthermore, she states she is concerned as he appears to have some beginning signs of dementia.  Patient is service connected, and I encourage her to get him an appointment at [facility] in order to be able to access services going forward.               Expected Discharge Plan: Home/Self Care Barriers to Discharge: Barriers Resolved   Patient Goals and CMS Choice Patient states their goals for this hospitalization and ongoing recovery are:: Go back home, see an outpt Dr CMS Medicare.gov Compare Post Acute Care list provided to:: Other (Comment Required)(No list provided) Choice offered to / list presented to : NA(see above)  Expected Discharge Plan and Services Expected Discharge Plan: Home/Self Care In-house Referral: PCP / [facility] Discharge Planning Services: Follow-up appt scheduled Post Acute Care Choice: NA Living arrangements for the past 2 months: Single Family Home                  Prior  Living Arrangements/Services Living arrangements for the past 2 months: Single Family Home Lives with:: Adult Children Patient language and need for interpreter reviewed:: Yes Do you feel safe going back to the place where you live?: Yes      Need for Family Participation in Patient Care: Yes (Comment) Care giver support system in place?: Yes (comment) Current home services: (none) Criminal Activity/Legal Involvement Pertinent to Current Situation/Hospitalization: No - Comment as needed  Activities of Daily Living Home Assistive Devices/Equipment: None ADL Screening (condition at time of admission) Patient's cognitive ability adequate to safely complete daily activities?: Yes Is the patient deaf or have difficulty hearing?: No Does the patient have difficulty seeing, even when wearing glasses/contacts?: No Does the patient have difficulty concentrating, remembering, or making decisions?: Yes Patient able to express need for assistance with ADLs?: Yes Does the patient have difficulty dressing or bathing?: No Independently performs ADLs?: Yes (appropriate for developmental age) Does the patient have difficulty walking or climbing stairs?: Yes Weakness of Legs: Both Weakness of Arms/Hands: Both  Permission Sought/Granted Permission sought to share information with : Family Supports Permission granted to share information with : Yes, Verbal Permission Granted  Share Information with NAME: Jose Schaefer     Permission granted to share info w Relationship: daughter  Permission granted to share info w Contact Information: [phone]   Emotional Assessment Appearance:: Appears older than stated age Attitude/Demeanor/Rapport: Engaged Affect (typically observed): Pleasant Orientation: : Oriented  to Self, Oriented to Place Alcohol / Substance Use: Not Applicable Psych Involvement: No (comment)  Admission diagnosis:  Dehydration [E86.0] COPD exacerbation (HCC) [J44.1] Esophagitis  [K20.9] Dysphagia, unspecified type [R13.10] Aspiration pneumonia of right lung, unspecified aspiration pneumonia type, unspecified part of lung (HCC) [J69.0] Patient Active Problem List   Diagnosis Date Noted  . Abnormal CT scan, esophagus   . Malnutrition of moderate degree 10/10/2018  . COPD exacerbation (HCC) 10/09/2018  . Dysphagia 10/09/2018  . Aspiration into airway 10/09/2018  . Lactic acidosis 10/09/2018  . Cerebral infarction due to unspecified mechanism   . CVA (cerebral infarction) 08/11/2015  . Cocaine abuse (HCC) 08/11/2015  . ARF (acute renal failure) (HCC) 08/11/2015  . Acute respiratory failure (HCC) 06/26/2011  . COPD (chronic obstructive pulmonary disease) with acute bronchitis (HCC) 06/26/2011  . Tobacco abuse   . Asthma    PCP:  Jose Rhody, FNP Pharmacy:   [facility] [place] [place] - [address] [place] [address] Phone: [phone] Fax: [phone]     Social Determinants of Health (SDOH) Interventions    Readmission Risk Interventions No flowsheet data found.

## 2018-10-12 NOTE — Progress Notes (Signed)
Subjective: Feels better with breathing today. Rare cough. Patient taking, joking, and laughing this morning without dyspnea or coughing noted. Relative at bedside monitoring the patient while he eats. Denies dysphagia, odynophagia this morning with altered diet. Denies abdominal pain, N/V. No other GI complaints.  Objective: Vital signs in last 24 hours: Temp:  [97.9 F (36.6 C)-98 F (36.7 C)] 98 F (36.7 C) (03/19 2102) Pulse Rate:  [54-59] 54 (03/20 0501) Resp:  [18] 18 (03/20 0501) BP: (137-153)/(75-90) 137/90 (03/20 0501) SpO2:  [89 %-100 %] 98 % (03/20 0755) Last BM Date: (pt unable to state) General:   Alert and oriented, pleasant Head:  Normocephalic and atraumatic. Eyes:  No icterus, sclera clear. Conjuctiva pink.  Heart:  S1, S2 present, no murmurs noted.  Lungs: Diminished but clear to auscultation bilaterally, without wheezing, rales, or rhonchi.  Abdomen:  Bowel sounds present, soft, non-tender, non-distended. No HSM or hernias noted. No rebound or guarding. No masses appreciated  Msk:  Symmetrical without gross deformities. Pulses:  Normal bilateral DP pulses noted. Extremities:  Without clubbing or edema. Neurologic:  Alert and  oriented x4;  grossly normal neurologically. Psych:  Alert and cooperative. Normal mood and affect.  Intake/Output from previous day: 03/19 0701 - 03/20 0700 In: 460 [P.O.:360; IV Piggyback:100] Out: 4400 [Urine:4400] Intake/Output this shift: No intake/output data recorded.  Lab Results: Recent Labs    10/09/18 0937 10/10/18 0707  WBC 4.3 7.3  HGB 14.4 12.3*  HCT 44.6 37.7*  PLT 263 211   BMET Recent Labs    10/09/18 0937 10/10/18 0707 10/11/18 0450  NA 142 143 146*  K 3.7 3.8 3.7  CL 104 112* 115*  CO2 29 24 26   GLUCOSE 129* 139* 139*  BUN 13 9 14   CREATININE 1.17 0.94 1.01  CALCIUM 8.9 8.1* 8.6*   LFT Recent Labs    10/10/18 0707  PROT 6.2*  ALBUMIN 3.0*  AST 16  ALT 10  ALKPHOS 46  BILITOT 0.4    PT/INR No results for input(s): LABPROT, INR in the last 72 hours. Hepatitis Panel No results for input(s): HEPBSAG, HCVAB, HEPAIGM, HEPBIGM in the last 72 hours.   Studies/Results: Dg Swallowing Func-speech Pathology  Result Date: 10/10/2018 Objective Swallowing Evaluation: Type of Study: MBS-Modified Barium Swallow Study  Patient Details Name: Jose Schaefer MRN: [id number] Date of Birth: 12/04/1950 Today's Date: 10/10/2018 Time: SLP Start Time (ACUTE ONLY): 1100 -SLP Stop Time (ACUTE ONLY): 1130 SLP Time Calculation (min) (ACUTE ONLY): 30 min Past Medical History: Past Medical History: Diagnosis Date . Asthma  . COPD (chronic obstructive pulmonary disease) (HCC)  . CVA (cerebral vascular accident) [facility](HCC) 2001  [place] . Tobacco abuse  Past Surgical History: No past surgical history on file. HPI: Jose Schaefer is a 68 y.o. male with medical history significant of COPD, previous stroke, is brought to the hospital with wheezing, cough shortness of breath.  His family notes that for quite some time now (weeks to months) patient has had difficulty swallowing.  He has coughing episodes after he tries to eat or drink.  This occurs more with solids than liquids, but does also occur with liquids.  This resulted in him wheezing as well.  He has difficult time eating in this state and has had poor p.o. intake.  Family feels that he has lost 15 pounds in the last several months.  He denies any chest pain, vomiting, diarrhea.  He does have nausea.  He is not had any fever. BSE/MBS requested  and SLP opted to complete MBSS due to h/o of dysphagia and PNA.  Subjective: "Alright! Assessment / Plan / Recommendation CHL IP CLINICAL IMPRESSIONS 10/10/2018 Clinical Impression Pt presents with moderate oropharyngeal phase dysphagia characterized by impaired lingual manipulation resulting in reduced bolus cohesiveness, piecemeal deglutition, and premature spillage; pharyngeal phase is characterized by delay in swallow initiation  and reduced laryngeal closure resulting in penetration and aspiration during the swallow with thin liquids (cough but not removed) between the arytenoids. Pt with penetration of nectars, but expelled before aspiration. Chin tuck was trialed and found to be effective when provided max cues from SLP (tsp thin with cues for chin tuck), he was not able to implement independently. Pt with prolonged oral transit with solid textures and delays in the valleculae for up to 7 seconds. Pt with questionable pharyngocele near valleculae which retained barium during the study. Recommend D1/puree with NTL via cup sips, no straws, and supervision with meals. Pt can likely be upgraded to soft textures after caregiver education. No family present today and SLP made phone call, however voicemail box was full. SLP to follow. SLP Visit Diagnosis Dysphagia, oropharyngeal phase (R13.12) Attention and concentration deficit following -- Frontal lobe and executive function deficit following -- Impact on safety and function Moderate aspiration risk;Risk for inadequate nutrition/hydration   CHL IP TREATMENT RECOMMENDATION 10/10/2018 Treatment Recommendations Therapy as outlined in treatment plan below   Prognosis 10/10/2018 Prognosis for Safe Diet Advancement Fair Barriers to Reach Goals Severity of deficits Barriers/Prognosis Comment -- CHL IP DIET RECOMMENDATION 10/10/2018 SLP Diet Recommendations Dysphagia 1 (Puree) solids;Nectar thick liquid Liquid Administration via Cup;No straw Medication Administration Crushed with puree Compensations Slow rate Postural Changes Remain semi-upright after after feeds/meals (Comment);Seated upright at 90 degrees   CHL IP OTHER RECOMMENDATIONS 10/10/2018 Recommended Consults -- Oral Care Recommendations Oral care BID;Staff/trained caregiver to provide oral care Other Recommendations Order thickener from pharmacy;Prohibited food (jello, ice cream, thin soups);Clarify dietary restrictions   CHL IP FOLLOW UP  RECOMMENDATIONS 10/10/2018 Follow up Recommendations 24 hour supervision/assistance   CHL IP FREQUENCY AND DURATION 10/10/2018 Speech Therapy Frequency (ACUTE ONLY) min 2x/week Treatment Duration 1 week      CHL IP ORAL PHASE 10/10/2018 Oral Phase Impaired Oral - Pudding Teaspoon -- Oral - Pudding Cup -- Oral - Honey Teaspoon Decreased bolus cohesion;Premature spillage Oral - Honey Cup -- Oral - Nectar Teaspoon Decreased bolus cohesion;Premature spillage Oral - Nectar Cup Decreased bolus cohesion;Premature spillage Oral - Nectar Straw -- Oral - Thin Teaspoon Decreased bolus cohesion;Premature spillage Oral - Thin Cup Decreased bolus cohesion;Premature spillage Oral - Thin Straw NT Oral - Puree Delayed oral transit;Decreased bolus cohesion Oral - Mech Soft -- Oral - Regular Delayed oral transit;Decreased bolus cohesion;Impaired mastication;Weak lingual manipulation;Piecemeal swallowing Oral - Multi-Consistency -- Oral - Pill Reduced posterior propulsion;Delayed oral transit Oral Phase - Comment --  CHL IP PHARYNGEAL PHASE 10/10/2018 Pharyngeal Phase Impaired Pharyngeal- Pudding Teaspoon -- Pharyngeal -- Pharyngeal- Pudding Cup -- Pharyngeal -- Pharyngeal- Honey Teaspoon Delayed swallow initiation-vallecula Pharyngeal -- Pharyngeal- Honey Cup -- Pharyngeal -- Pharyngeal- Nectar Teaspoon Delayed swallow initiation-vallecula;Penetration/Aspiration during swallow;Pharyngeal residue - valleculae;Pharyngeal residue - pyriform Pharyngeal Material enters airway, remains ABOVE vocal cords then ejected out;Material does not enter airway Pharyngeal- Nectar Cup Delayed swallow initiation-vallecula;Delayed swallow initiation-pyriform sinuses;Penetration/Aspiration during swallow;Pharyngeal residue - valleculae;Pharyngeal residue - pyriform Pharyngeal Material does not enter airway;Material enters airway, remains ABOVE vocal cords then ejected out Pharyngeal- Nectar Straw -- Pharyngeal -- Pharyngeal- Thin Teaspoon Delayed swallow  initiation-vallecula;Delayed swallow initiation-pyriform sinuses;Penetration/Aspiration during  swallow;Reduced airway/laryngeal closure;Trace aspiration;Pharyngeal residue - valleculae;Pharyngeal residue - pyriform;Inter-arytenoid space residue;Compensatory strategies attempted (with notebox) Pharyngeal Material enters airway, passes BELOW cords and not ejected out despite cough attempt by patient;Material enters airway, remains ABOVE vocal cords then ejected out Pharyngeal- Thin Cup NT Pharyngeal -- Pharyngeal- Thin Straw -- Pharyngeal -- Pharyngeal- Puree Delayed swallow initiation-vallecula Pharyngeal -- Pharyngeal- Mechanical Soft -- Pharyngeal -- Pharyngeal- Regular Delayed swallow initiation-vallecula;Pharyngeal residue - valleculae Pharyngeal -- Pharyngeal- Multi-consistency -- Pharyngeal -- Pharyngeal- Pill Pharyngeal residue - valleculae Pharyngeal -- Pharyngeal Comment --  CHL IP CERVICAL ESOPHAGEAL PHASE 10/10/2018 Cervical Esophageal Phase WFL Pudding Teaspoon -- Pudding Cup -- Honey Teaspoon -- Honey Cup -- Nectar Teaspoon -- Nectar Cup -- Nectar Straw -- Thin Teaspoon -- Thin Cup -- Thin Straw -- Puree -- Mechanical Soft -- Regular -- Multi-consistency -- Pill -- Cervical Esophageal Comment -- Thank you, Havery Moros, CCC-SLP [phone] PORTER,DABNEY 10/10/2018, 8:56 PM               Assessment: Pleasant 68 year old gentleman presents emergency department with complaints of cough, wheezing particularly related to mealtime.  Subsequent weight loss due to difficulties eating.  CT showed moderate amount of debris within the distal trachea and right mainstem bronchus, potentially representing aspiration.  Evidence of emphysema but no pneumonia.  Noted to have oropharyngeal dysphagia, at increased risk of aspiration, recommended pured diet, nectar thick liquids.  Noted abnormal chest CT with irregular circumferential thickening along the length of the thoracic esophagus; possibly esophagitis either  infectious versus reflux related but cannot exclude underlying malignancy.  Yesterday the patient was having significant wheezing and coughing and decided to hold off on endoscopic evaluation for now, until improvement in respiratory status. When he is a candidate, plan for propofol due to history of significant ETOH. SLP evaluated the patient and recommendations below.  Today his breathing appears improved. Joking and laughing with me without overt noted respiratory distress, dyspnea, or cough. Eating his breakfast tray this morning without coughing, relative at bedside for monitoring; no odynophagia or dysphagia today with altered diet.  SLP recommendations:  Diet recommendations: Dysphagia 1 (puree);Nectar-thick liquid Liquids provided via: Cup;No straw;Teaspoon Medication Administration: Whole meds with puree Supervision: Patient able to self feed;Full supervision/cueing for compensatory strategies Compensations: Slow rate;Small sips/bites Postural Changes and/or Swallow Maneuvers: Seated upright 90 degrees;Upright 30-60 min after meal Pt will need 100% supervision for meals due to cognitive deficits and impulsivity.  Plan: 1. Diet recommendations per SLP, as above 2. Hold off on endoscopic evaluation given how recent he had aspiration and respiratory difficulties; tomorrow at the earliest for EGD but possibly not until early next week 3. CONTINUE TO MONITOR PATIENT DURING MEALS 4. Supportive measures   Thank you for allowing [place] to participate in the care of Jose Guernsey, DNP, AGNP-C Adult & Gerontological Nurse Practitioner [facility]     LOS: 3 days    10/12/2018, 8:17 AM

## 2018-10-12 NOTE — H&P (View-Only) (Signed)
  Subjective: Feels better with breathing today. Rare cough. Patient taking, joking, and laughing this morning without dyspnea or coughing noted. Relative at bedside monitoring the patient while he eats. Denies dysphagia, odynophagia this morning with altered diet. Denies abdominal pain, N/V. No other GI complaints.  Objective: Vital signs in last 24 hours: Temp:  [97.9 F (36.6 C)-98 F (36.7 C)] 98 F (36.7 C) (03/19 2102) Pulse Rate:  [54-59] 54 (03/20 0501) Resp:  [18] 18 (03/20 0501) BP: (137-153)/(75-90) 137/90 (03/20 0501) SpO2:  [89 %-100 %] 98 % (03/20 0755) Last BM Date: (pt unable to state) General:   Alert and oriented, pleasant Head:  Normocephalic and atraumatic. Eyes:  No icterus, sclera clear. Conjuctiva pink.  Heart:  S1, S2 present, no murmurs noted.  Lungs: Diminished but clear to auscultation bilaterally, without wheezing, rales, or rhonchi.  Abdomen:  Bowel sounds present, soft, non-tender, non-distended. No HSM or hernias noted. No rebound or guarding. No masses appreciated  Msk:  Symmetrical without gross deformities. Pulses:  Normal bilateral DP pulses noted. Extremities:  Without clubbing or edema. Neurologic:  Alert and  oriented x4;  grossly normal neurologically. Psych:  Alert and cooperative. Normal mood and affect.  Intake/Output from previous day: 03/19 0701 - 03/20 0700 In: 460 [P.O.:360; IV Piggyback:100] Out: 4400 [Urine:4400] Intake/Output this shift: No intake/output data recorded.  Lab Results: Recent Labs    10/09/18 0937 10/10/18 0707  WBC 4.3 7.3  HGB 14.4 12.3*  HCT 44.6 37.7*  PLT 263 211   BMET Recent Labs    10/09/18 0937 10/10/18 0707 10/11/18 0450  NA 142 143 146*  K 3.7 3.8 3.7  CL 104 112* 115*  CO2 29 24 26  GLUCOSE 129* 139* 139*  BUN 13 9 14  CREATININE 1.17 0.94 1.01  CALCIUM 8.9 8.1* 8.6*   LFT Recent Labs    10/10/18 0707  PROT 6.2*  ALBUMIN 3.0*  AST 16  ALT 10  ALKPHOS 46  BILITOT 0.4    PT/INR No results for input(s): LABPROT, INR in the last 72 hours. Hepatitis Panel No results for input(s): HEPBSAG, HCVAB, HEPAIGM, HEPBIGM in the last 72 hours.   Studies/Results: Dg Swallowing Func-speech Pathology  Result Date: 10/10/2018 Objective Swallowing Evaluation: Type of Study: MBS-Modified Barium Swallow Study  Patient Details Name: Lynwood Alperin MRN: [id number] Date of Birth: 02/25/1951 Today's Date: 10/10/2018 Time: SLP Start Time (ACUTE ONLY): 1100 -SLP Stop Time (ACUTE ONLY): 1130 SLP Time Calculation (min) (ACUTE ONLY): 30 min Past Medical History: Past Medical History: Diagnosis Date . Asthma  . COPD (chronic obstructive pulmonary disease) (HCC)  . CVA (cerebral vascular accident) (HCC) 2001  [place] . Tobacco abuse  Past Surgical History: No past surgical history on file. HPI: Ikey Rzasa is a 68 y.o. male with medical history significant of COPD, previous stroke, is brought to the hospital with wheezing, cough shortness of breath.  His family notes that for quite some time now (weeks to months) patient has had difficulty swallowing.  He has coughing episodes after he tries to eat or drink.  This occurs more with solids than liquids, but does also occur with liquids.  This resulted in him wheezing as well.  He has difficult time eating in this state and has had poor p.o. intake.  Family feels that he has lost 15 pounds in the last several months.  He denies any chest pain, vomiting, diarrhea.  He does have nausea.  He is not had any fever. BSE/MBS requested   and SLP opted to complete MBSS due to h/o of dysphagia and PNA.  Subjective: "Alright! Assessment / Plan / Recommendation CHL IP CLINICAL IMPRESSIONS 10/10/2018 Clinical Impression Pt presents with moderate oropharyngeal phase dysphagia characterized by impaired lingual manipulation resulting in reduced bolus cohesiveness, piecemeal deglutition, and premature spillage; pharyngeal phase is characterized by delay in swallow initiation  and reduced laryngeal closure resulting in penetration and aspiration during the swallow with thin liquids (cough but not removed) between the arytenoids. Pt with penetration of nectars, but expelled before aspiration. Chin tuck was trialed and found to be effective when provided max cues from SLP (tsp thin with cues for chin tuck), he was not able to implement independently. Pt with prolonged oral transit with solid textures and delays in the valleculae for up to 7 seconds. Pt with questionable pharyngocele near valleculae which retained barium during the study. Recommend D1/puree with NTL via cup sips, no straws, and supervision with meals. Pt can likely be upgraded to soft textures after caregiver education. No family present today and SLP made phone call, however voicemail box was full. SLP to follow. SLP Visit Diagnosis Dysphagia, oropharyngeal phase (R13.12) Attention and concentration deficit following -- Frontal lobe and executive function deficit following -- Impact on safety and function Moderate aspiration risk;Risk for inadequate nutrition/hydration   CHL IP TREATMENT RECOMMENDATION 10/10/2018 Treatment Recommendations Therapy as outlined in treatment plan below   Prognosis 10/10/2018 Prognosis for Safe Diet Advancement Fair Barriers to Reach Goals Severity of deficits Barriers/Prognosis Comment -- CHL IP DIET RECOMMENDATION 10/10/2018 SLP Diet Recommendations Dysphagia 1 (Puree) solids;Nectar thick liquid Liquid Administration via Cup;No straw Medication Administration Crushed with puree Compensations Slow rate Postural Changes Remain semi-upright after after feeds/meals (Comment);Seated upright at 90 degrees   CHL IP OTHER RECOMMENDATIONS 10/10/2018 Recommended Consults -- Oral Care Recommendations Oral care BID;Staff/trained caregiver to provide oral care Other Recommendations Order thickener from pharmacy;Prohibited food (jello, ice cream, thin soups);Clarify dietary restrictions   CHL IP FOLLOW UP  RECOMMENDATIONS 10/10/2018 Follow up Recommendations 24 hour supervision/assistance   CHL IP FREQUENCY AND DURATION 10/10/2018 Speech Therapy Frequency (ACUTE ONLY) min 2x/week Treatment Duration 1 week      CHL IP ORAL PHASE 10/10/2018 Oral Phase Impaired Oral - Pudding Teaspoon -- Oral - Pudding Cup -- Oral - Honey Teaspoon Decreased bolus cohesion;Premature spillage Oral - Honey Cup -- Oral - Nectar Teaspoon Decreased bolus cohesion;Premature spillage Oral - Nectar Cup Decreased bolus cohesion;Premature spillage Oral - Nectar Straw -- Oral - Thin Teaspoon Decreased bolus cohesion;Premature spillage Oral - Thin Cup Decreased bolus cohesion;Premature spillage Oral - Thin Straw NT Oral - Puree Delayed oral transit;Decreased bolus cohesion Oral - Mech Soft -- Oral - Regular Delayed oral transit;Decreased bolus cohesion;Impaired mastication;Weak lingual manipulation;Piecemeal swallowing Oral - Multi-Consistency -- Oral - Pill Reduced posterior propulsion;Delayed oral transit Oral Phase - Comment --  CHL IP PHARYNGEAL PHASE 10/10/2018 Pharyngeal Phase Impaired Pharyngeal- Pudding Teaspoon -- Pharyngeal -- Pharyngeal- Pudding Cup -- Pharyngeal -- Pharyngeal- Honey Teaspoon Delayed swallow initiation-vallecula Pharyngeal -- Pharyngeal- Honey Cup -- Pharyngeal -- Pharyngeal- Nectar Teaspoon Delayed swallow initiation-vallecula;Penetration/Aspiration during swallow;Pharyngeal residue - valleculae;Pharyngeal residue - pyriform Pharyngeal Material enters airway, remains ABOVE vocal cords then ejected out;Material does not enter airway Pharyngeal- Nectar Cup Delayed swallow initiation-vallecula;Delayed swallow initiation-pyriform sinuses;Penetration/Aspiration during swallow;Pharyngeal residue - valleculae;Pharyngeal residue - pyriform Pharyngeal Material does not enter airway;Material enters airway, remains ABOVE vocal cords then ejected out Pharyngeal- Nectar Straw -- Pharyngeal -- Pharyngeal- Thin Teaspoon Delayed swallow  initiation-vallecula;Delayed swallow initiation-pyriform sinuses;Penetration/Aspiration during  swallow;Reduced airway/laryngeal closure;Trace aspiration;Pharyngeal residue - valleculae;Pharyngeal residue - pyriform;Inter-arytenoid space residue;Compensatory strategies attempted (with notebox) Pharyngeal Material enters airway, passes BELOW cords and not ejected out despite cough attempt by patient;Material enters airway, remains ABOVE vocal cords then ejected out Pharyngeal- Thin Cup NT Pharyngeal -- Pharyngeal- Thin Straw -- Pharyngeal -- Pharyngeal- Puree Delayed swallow initiation-vallecula Pharyngeal -- Pharyngeal- Mechanical Soft -- Pharyngeal -- Pharyngeal- Regular Delayed swallow initiation-vallecula;Pharyngeal residue - valleculae Pharyngeal -- Pharyngeal- Multi-consistency -- Pharyngeal -- Pharyngeal- Pill Pharyngeal residue - valleculae Pharyngeal -- Pharyngeal Comment --  CHL IP CERVICAL ESOPHAGEAL PHASE 10/10/2018 Cervical Esophageal Phase WFL Pudding Teaspoon -- Pudding Cup -- Honey Teaspoon -- Honey Cup -- Nectar Teaspoon -- Nectar Cup -- Nectar Straw -- Thin Teaspoon -- Thin Cup -- Thin Straw -- Puree -- Mechanical Soft -- Regular -- Multi-consistency -- Pill -- Cervical Esophageal Comment -- Thank you, Havery Moros, CCC-SLP [phone] PORTER,DABNEY 10/10/2018, 8:56 PM               Assessment: Pleasant 68 year old gentleman presents emergency department with complaints of cough, wheezing particularly related to mealtime.  Subsequent weight loss due to difficulties eating.  CT showed moderate amount of debris within the distal trachea and right mainstem bronchus, potentially representing aspiration.  Evidence of emphysema but no pneumonia.  Noted to have oropharyngeal dysphagia, at increased risk of aspiration, recommended pured diet, nectar thick liquids.  Noted abnormal chest CT with irregular circumferential thickening along the length of the thoracic esophagus; possibly esophagitis either  infectious versus reflux related but cannot exclude underlying malignancy.  Yesterday the patient was having significant wheezing and coughing and decided to hold off on endoscopic evaluation for now, until improvement in respiratory status. When he is a candidate, plan for propofol due to history of significant ETOH. SLP evaluated the patient and recommendations below.  Today his breathing appears improved. Joking and laughing with me without overt noted respiratory distress, dyspnea, or cough. Eating his breakfast tray this morning without coughing, relative at bedside for monitoring; no odynophagia or dysphagia today with altered diet.  SLP recommendations:  Diet recommendations: Dysphagia 1 (puree);Nectar-thick liquid Liquids provided via: Cup;No straw;Teaspoon Medication Administration: Whole meds with puree Supervision: Patient able to self feed;Full supervision/cueing for compensatory strategies Compensations: Slow rate;Small sips/bites Postural Changes and/or Swallow Maneuvers: Seated upright 90 degrees;Upright 30-60 min after meal Pt will need 100% supervision for meals due to cognitive deficits and impulsivity.  Plan: 1. Diet recommendations per SLP, as above 2. Hold off on endoscopic evaluation given how recent he had aspiration and respiratory difficulties; tomorrow at the earliest for EGD but possibly not until early next week 3. CONTINUE TO MONITOR PATIENT DURING MEALS 4. Supportive measures   Thank you for allowing [place] to participate in the care of Carolin Guernsey, DNP, AGNP-C Adult & Gerontological Nurse Practitioner [facility]     LOS: 3 days    10/12/2018, 8:17 AM

## 2018-10-12 NOTE — Progress Notes (Signed)
PROGRESS NOTE    Jose Schaefer  WOE:[id number] DOB: 1951/06/03 DOA: 10/09/2018 PCP: Duffy Rhody, FNP    Brief Narrative:  68 year old male with a history of COPD, previous stroke, was brought to the hospital with cough, shortness of breath and wheezing.  His family notes that he had been aspirating while eating and drinking for the last several weeks to months.  Due to his inability to eat, he has been losing weight.  He was admitted to the hospital and started on treatment for COPD exacerbation.  CT chest indicated circumferential esophageal thickening.  GI has been consulted.  He is also being followed by speech therapy for dysphagia.   Assessment & Plan:   Active Problems:   Acute respiratory failure (HCC)   COPD exacerbation (HCC)   Dysphagia   Aspiration into airway   Lactic acidosis   Malnutrition of moderate degree   Abnormal CT scan, esophagus   Aspiration pneumonia of right lung (HCC)   Esophagitis   1. Acute respiratory failure with hypoxia.  Related to COPD exacerbation.  Symptoms precipitated by aspiration from dysphagia.  Continue to wean off oxygen as tolerated. 2. COPD exacerbation.  Overall wheezing improved. Continue on bronchodilators. Start to wean steroids. 3. Dysphagia with aspiration.  Seen by speech therapy and started on pured diet with nectar thick liquids. Currently on unasyn for aspiration 4. Esophagitis/abnormal findings on chest CT.  Appreciate GI input.  Continue on Diflucan for concerns for underlying candidal esophagitis.  Continue on PPI. Plans for EGD on 3/21 5. Lactic acidosis.  Serum lactate has trended down with IV fluids.  He is not septic or toxic.  Continue to follow.   DVT prophylaxis: Heparin Code Status: Full code Family Communication: No family present Disposition Plan: Discharge home once work-up is complete   Consultants:   Gastroenterology  Procedures:     Antimicrobials:   Unasyn 3/17 >   Subjective: No chest  pain, no shortness of breath. No cough.  Objective: Vitals:   10/12/18 0501 10/12/18 0755 10/12/18 1345 10/12/18 1407  BP: 137/90   139/88  Pulse: (!) 54   (!) 59  Resp: 18   16  Temp:    98.2 F (36.8 C)  TempSrc:    Oral  SpO2: 98% 98% 94% 98%  Weight:      Height:        Intake/Output Summary (Last 24 hours) at 10/12/2018 1721 Last data filed at 10/12/2018 1500 Gross per 24 hour  Intake 480 ml  Output 3900 ml  Net -3420 ml   Filed Weights   10/09/18 0912 10/09/18 1330  Weight: 61.2 kg 50.6 kg    Examination:  General exam: Alert, awake, oriented x 3 Respiratory system: Clear to auscultation. Respiratory effort normal. Cardiovascular system:RRR. No murmurs, rubs, gallops. Gastrointestinal system: Abdomen is nondistended, soft and nontender. No organomegaly or masses felt. Normal bowel sounds heard. Central nervous system: Alert and oriented. No focal neurological deficits. Extremities: No C/C/E, +pedal pulses Skin: No rashes, lesions or ulcers Psychiatry: Judgement and insight appear normal. Mood & affect appropriate.    Data Reviewed: I have personally reviewed following labs and imaging studies  CBC: Recent Labs  Lab 10/09/18 0937 10/10/18 0707  WBC 4.3 7.3  NEUTROABS 2.1  --   HGB 14.4 12.3*  HCT 44.6 37.7*  MCV 87.6 87.3  PLT 263 211   Basic Metabolic Panel: Recent Labs  Lab 10/09/18 0937 10/10/18 0707 10/11/18 0450  NA 142 143 146*  K 3.7  3.8 3.7  CL 104 112* 115*  CO2 29 24 26   GLUCOSE 129* 139* 139*  BUN 13 9 14   CREATININE 1.17 0.94 1.01  CALCIUM 8.9 8.1* 8.6*   GFR: Estimated Creatinine Clearance: 50.1 mL/min (by C-G formula based on SCr of 1.01 mg/dL). Liver Function Tests: Recent Labs  Lab 10/10/18 0707  AST 16  ALT 10  ALKPHOS 46  BILITOT 0.4  PROT 6.2*  ALBUMIN 3.0*   No results for input(s): LIPASE, AMYLASE in the last 168 hours. No results for input(s): AMMONIA in the last 168 hours. Coagulation Profile: No results  for input(s): INR, PROTIME in the last 168 hours. Cardiac Enzymes: Recent Labs  Lab 10/09/18 0937  TROPONINI <0.03   BNP (last 3 results) No results for input(s): PROBNP in the last 8760 hours. HbA1C: No results for input(s): HGBA1C in the last 72 hours. CBG: No results for input(s): GLUCAP in the last 168 hours. Lipid Profile: No results for input(s): CHOL, HDL, LDLCALC, TRIG, CHOLHDL, LDLDIRECT in the last 72 hours. Thyroid Function Tests: Recent Labs    10/09/18 1814  TSH 0.142*   Anemia Panel: No results for input(s): VITAMINB12, FOLATE, FERRITIN, TIBC, IRON, RETICCTPCT in the last 72 hours. Sepsis Labs: Recent Labs  Lab 10/09/18 2248 10/10/18 0202 10/10/18 0707 10/11/18 0450  LATICACIDVEN 5.4* 4.1* 2.6* 1.3    Recent Results (from the past 240 hour(s))  Culture, blood (routine x 2)     Status: None (Preliminary result)   Collection Time: 10/09/18 12:40 PM  Result Value Ref Range Status   Specimen Description BLOOD LEFT ARM  Final   Special Requests   Final    BOTTLES DRAWN AEROBIC ONLY Blood Culture results may not be optimal due to an inadequate volume of blood received in culture bottles   Culture   Final    NO GROWTH 3 DAYS Performed at [facility], [address] [place], [place] [address]    Report Status PENDING  Incomplete  Culture, blood (routine x 2)     Status: None (Preliminary result)   Collection Time: 10/09/18 12:52 PM  Result Value Ref Range Status   Specimen Description BLOOD RIGHT HAND  Final   Special Requests   Final    BOTTLES DRAWN AEROBIC AND ANAEROBIC Blood Culture results may not be optimal due to an inadequate volume of blood received in culture bottles   Culture   Final    NO GROWTH 3 DAYS Performed at [facility], [address] [place], [place] [address]    Report Status PENDING  Incomplete         Radiology Studies: No results found.      Scheduled Meds: . heparin  5,000 Units Subcutaneous Q8H  .  ipratropium-albuterol  3 mL Nebulization TID  . methylPREDNISolone (SOLU-MEDROL) injection  60 mg Intravenous Q12H  . pantoprazole (PROTONIX) IV  40 mg Intravenous Q12H   Continuous Infusions: . ampicillin-sulbactam (UNASYN) IV 3 g (10/12/18 1046)  . fluconazole (DIFLUCAN) IV 200 mg (10/11/18 1833)     LOS: 3 days    Time spent:    Erick Blinks, MD [facility]   If 7PM-7AM, please contact night-coverage [email or web address]  10/12/2018, 5:21 PM

## 2018-10-13 ENCOUNTER — Other Ambulatory Visit: Payer: Self-pay

## 2018-10-13 ENCOUNTER — Inpatient Hospital Stay (HOSPITAL_COMMUNITY): Payer: Medicare Other | Admitting: Anesthesiology

## 2018-10-13 ENCOUNTER — Encounter (HOSPITAL_COMMUNITY): Payer: Self-pay

## 2018-10-13 ENCOUNTER — Encounter (HOSPITAL_COMMUNITY)
Admission: EM | Disposition: A | Discharge status: Home / Self Care | Payer: Self-pay | Source: Home / Self Care | Attending: Internal Medicine

## 2018-10-13 DIAGNOSIS — K222 Esophageal obstruction: Secondary | ICD-10-CM

## 2018-10-13 DIAGNOSIS — K2970 Gastritis, unspecified, without bleeding: Secondary | ICD-10-CM

## 2018-10-13 DIAGNOSIS — K449 Diaphragmatic hernia without obstruction or gangrene: Secondary | ICD-10-CM

## 2018-10-13 DIAGNOSIS — E440 Moderate protein-calorie malnutrition: Secondary | ICD-10-CM

## 2018-10-13 HISTORY — PX: ESOPHAGEAL DILATION: SHX303

## 2018-10-13 HISTORY — PX: ESOPHAGOGASTRODUODENOSCOPY: SHX5428

## 2018-10-13 SURGERY — EGD (ESOPHAGOGASTRODUODENOSCOPY)
Anesthesia: General

## 2018-10-13 MED ORDER — AMOXICILLIN-POT CLAVULANATE 875-125 MG PO TABS: 1.0000 | ORAL_TABLET | Freq: Two times a day (BID) | ORAL | 0 refills | Status: AC

## 2018-10-13 MED ORDER — LIDOCAINE VISCOUS HCL 2 % MT SOLN
OROMUCOSAL | Status: AC
  Filled 2018-10-13: qty 15

## 2018-10-13 MED ORDER — LIDOCAINE VISCOUS HCL 2 % MT SOLN: 5.0000 mL | Freq: Once | OROMUCOSAL | Status: DC

## 2018-10-13 MED ORDER — HYDROCODONE-ACETAMINOPHEN 7.5-325 MG PO TABS: 1.0000 | ORAL_TABLET | Freq: Once | ORAL | Status: DC | PRN

## 2018-10-13 MED ORDER — PROPOFOL 10 MG/ML IV BOLUS
INTRAVENOUS | Status: AC
  Filled 2018-10-13: qty 20

## 2018-10-13 MED ORDER — PROMETHAZINE HCL 25 MG/ML IJ SOLN: 6.2500 mg | INTRAMUSCULAR | Status: DC | PRN

## 2018-10-13 MED ORDER — LACTATED RINGERS IV SOLN: INTRAVENOUS | Status: DC

## 2018-10-13 MED ORDER — KETAMINE HCL 10 MG/ML IJ SOLN
INTRAMUSCULAR | Status: DC | PRN
  Administered 2018-10-13: 20 mg via INTRAVENOUS

## 2018-10-13 MED ORDER — MIDAZOLAM HCL 2 MG/2ML IJ SOLN: 0.5000 mg | Freq: Once | INTRAMUSCULAR | Status: DC | PRN

## 2018-10-13 MED ORDER — STERILE WATER FOR IRRIGATION IR SOLN
Status: DC | PRN
  Administered 2018-10-13: 10:00:00

## 2018-10-13 MED ORDER — LIDOCAINE VISCOUS HCL 2 % MT SOLN
OROMUCOSAL | Status: DC | PRN
  Administered 2018-10-13: 1 via OROMUCOSAL

## 2018-10-13 MED ORDER — ALBUTEROL SULFATE (2.5 MG/3ML) 0.083% IN NEBU: 2.5000 mg | INHALATION_SOLUTION | Freq: Four times a day (QID) | RESPIRATORY_TRACT | 0 refills | Status: DC | PRN

## 2018-10-13 MED ORDER — PANTOPRAZOLE SODIUM 40 MG PO TBEC: 40.0000 mg | DELAYED_RELEASE_TABLET | Freq: Every day | ORAL | 0 refills | Status: DC

## 2018-10-13 MED ORDER — PROPOFOL 10 MG/ML IV BOLUS
INTRAVENOUS | Status: DC | PRN
  Administered 2018-10-13: 140 mg via INTRAVENOUS

## 2018-10-13 MED ORDER — KETAMINE HCL 50 MG/5ML IJ SOSY
PREFILLED_SYRINGE | INTRAMUSCULAR | Status: AC
  Filled 2018-10-13: qty 5

## 2018-10-13 MED ORDER — PANTOPRAZOLE SODIUM 40 MG PO TBEC: 40.0000 mg | DELAYED_RELEASE_TABLET | Freq: Every day | ORAL | Status: DC

## 2018-10-13 MED ORDER — PREDNISONE 20 MG PO TABS: ORAL_TABLET | ORAL | 0 refills | Status: DC

## 2018-10-13 MED ORDER — HYDROMORPHONE HCL 1 MG/ML IJ SOLN: 0.2500 mg | INTRAMUSCULAR | Status: DC | PRN

## 2018-10-13 MED ORDER — RESOURCE THICKENUP CLEAR PO POWD: ORAL | 0 refills | Status: DC

## 2018-10-13 NOTE — Discharge Summary (Addendum)
Physician Discharge Summary  [name] XYB:[id number] DOB: 02/18/1951 DOA: 10/09/2018  PCP: Duffy Rhody, FNP GI: [facility]  Admit date: 10/09/2018 Discharge date: 10/13/2018  Admitted From:  Home  Disposition: Home with family 24/7 supervision  Recommendations for Outpatient Follow-up:  1. Follow up with PCP in 1 weeks 2. Follow up with GI in 4 months [facility] 4. Please check TSH, FT4 in 1 month 5. Please follow up EGD biopsy results  Discharge Condition: STABLE   CODE STATUS: FULL   Diet: Dysphagia 1 (puree);Nectar-thick liquid  Brief Hospitalization Summary: Please see all hospital notes, images, labs for full details of the hospitalization. Dr. Benetta Spar HPI: Jose Schaefer is a 68 y.o. male with medical history significant of COPD, previous stroke, is brought to the hospital with wheezing, cough shortness of breath.  His family notes that for quite some time now (weeks to months) patient has had difficulty swallowing.  He has coughing episodes after he tries to eat or drink.  This occurs more with solids than liquids, but does also occur with liquids.  This resulted in him wheezing as well.  He has difficult time eating in this state and has had poor p.o. intake.  Family feels that he has lost 15 pounds in the last several months.  He denies any chest pain, vomiting, diarrhea.  He does have nausea.  He is not had any fever.  ED Course: In the emergency room, he is noted to be short of breath and wheezing.  CT of the chest indicates debris in the trachea and right main bronchus indicating aspiration.  Also comments on circumferential thickening of the esophagus possibly related to esophagitis.  Since patient is still short of breath and wheezing, he is been referred for admission.  Brief Narrative:  68 year old male with a history of COPD, previous stroke, was brought to the hospital with cough, shortness of breath and wheezing.  His family notes that he had  been aspirating while eating and drinking for the last several weeks to months.  Due to his inability to eat, he has been losing weight.  He was admitted to the hospital and started on treatment for COPD exacerbation.  CT chest indicated circumferential esophageal thickening.  GI has been consulted.  He is also being followed by speech therapy for dysphagia.   Assessment & Plan:   Active Problems:   Acute respiratory failure (HCC)   COPD exacerbation (HCC)   Dysphagia   Aspiration into airway   Lactic acidosis   Malnutrition of moderate degree   Abnormal CT scan, esophagus   Aspiration pneumonia of right lung (HCC)   Esophagitis   1. Acute respiratory failure with hypoxia.  Related to COPD exacerbation.  Symptoms precipitated by aspiration from dysphagia.  he is clinically much better now and will discharge home with family.   2. COPD exacerbation.  Overall wheezing improved. Continue on bronchodilators, steroids.  Wean oxygen and send home on oxygen only if needed.  3. Dysphagia with aspiration.  Seen by speech therapy and started on pured diet with nectar thick liquids. Currently on unasyn for aspiration 4. Esophageal stricture - Pt had EGD today and s/p dilation.  Follow up with GI in 4 months. Continue nectar thick liquid diet.   5. Esophagitis/abnormal findings on chest CT.  Appreciate GI input.  Continue protonix.   Biopsies taken for EGD.  Follow up with GI strongly advised.  6. Lactic acidosis.  RESOLVED.  Serum lactate has trended  down with IV fluids.  He is not septic or toxic.  Continue to follow.   DVT prophylaxis: Heparin Code Status: Full code Family Communication: No family present during rounds Disposition Plan: Discharge home with family providing 24/7 supervision.    Consultants:   Gastroenterology  Procedures:   EGD 10/13/18 Impression:       - Benign-appearing esophageal STRICTURE.                           - Medium-sized hiatal hernia.                            - MILD Gastritis. Biopsied. Moderate Sedation:      Per Anesthesia Care Recommendation:           - Patient has a contact number available for emergencies. The signs and symptoms of potential                            delayed complications were discussed with the patient. Return to normal activities tomorrow.                            Written discharge instructions were provided to the patient.                           - Nectar thick liquids diet.                           - Continue present medications.                           - Await pathology results.                           - Return to GI office in 4 months.  Antimicrobials:   Unasyn 3/17 >  Discharge Diagnoses:  Active Problems:   Acute respiratory failure (HCC)   COPD exacerbation (HCC)   Dysphagia   Aspiration into airway   Lactic acidosis   Malnutrition of moderate degree   Abnormal CT scan, esophagus   Aspiration pneumonia of right lung (HCC)   Esophagitis   Discharge Instructions: Discharge Instructions    Call MD for:  difficulty breathing, headache or visual disturbances   Complete by:  As directed    Call MD for:  extreme fatigue   Complete by:  As directed    Call MD for:  persistant dizziness or light-headedness   Complete by:  As directed    Increase activity slowly   Complete by:  As directed      Allergies as of 10/13/2018   No Known Allergies     Medication List    TAKE these medications   albuterol 108 (90 Base) MCG/ACT inhaler Commonly known as:  PROVENTIL HFA;VENTOLIN HFA Inhale 2 puffs into the lungs every 2 (two) hours as needed for wheezing or shortness of breath (cough).   albuterol (2.5 MG/3ML) 0.083% nebulizer solution Commonly known as:  PROVENTIL Take 3 mLs (2.5 mg total) by nebulization every 6 (six) hours as needed for wheezing or shortness of breath. Patient will need a nebulizer machine.   amoxicillin-clavulanate 875-125 MG tablet Commonly known as:  Augmentin Take 1 tablet by mouth 2 (two) times daily for 3 days.   pantoprazole 40 MG tablet Commonly known as:  PROTONIX Take 1 tablet (40 mg total) by mouth daily before breakfast for 30 days. Start taking on:  October 14, 2018   predniSONE 20 MG tablet Commonly known as:  DELTASONE Take 2 PO QAM x5days What changed:    medication strength  how much to take  how to take this  when to take this  additional instructions   Resource ThickenUp Clear Powd Use as directed to thicken liquids to nectar consistency            Durable Medical Equipment  (From admission, onward)         Start     Ordered   10/13/18 1151  For home use only DME Nebulizer machine  Once    Question:  Patient needs a nebulizer to treat with the following condition  Answer:  COPD (chronic obstructive pulmonary disease) (HCC)   10/13/18 1150         Follow-up Information    Duffy Rhody, FNP Follow up on 10/16/2018.   Specialty:  Family Medicine Why:  Tuesday at 2:30 with Ms Lowella Grip information: [address] [place] [address] [phone]        [facility] up on 11/26/2018.   Why:  Monday at [facility] in the [place] clinic with Dr Hart [place].  Prior to this appointment, you must go on-line or appear at the walk-in clinic M-F between 8:30 and 4 to update your MEANS information. Contact information: [address] [facility] [place] [address] [phone]          No Known Allergies Allergies as of 10/13/2018   No Known Allergies     Medication List    TAKE these medications   albuterol 108 (90 Base) MCG/ACT inhaler Commonly known as:  PROVENTIL HFA;VENTOLIN HFA Inhale 2 puffs into the lungs every 2 (two) hours as needed for wheezing or shortness of breath (cough).   albuterol (2.5 MG/3ML) 0.083% nebulizer solution Commonly known as:  PROVENTIL Take 3 mLs (2.5 mg total) by nebulization every 6 (six) hours as needed for wheezing or  shortness of breath. Patient will need a nebulizer machine.   amoxicillin-clavulanate 875-125 MG tablet Commonly known as:  Augmentin Take 1 tablet by mouth 2 (two) times daily for 3 days.   pantoprazole 40 MG tablet Commonly known as:  PROTONIX Take 1 tablet (40 mg total) by mouth daily before breakfast for 30 days. Start taking on:  October 14, 2018   predniSONE 20 MG tablet Commonly known as:  DELTASONE Take 2 PO QAM x5days What changed:    medication strength  how much to take  how to take this  when to take this  additional instructions   Resource ThickenUp Clear Powd Use as directed to thicken liquids to nectar consistency            Durable Medical Equipment  (From admission, onward)         Start     Ordered   10/13/18 1151  For home use only DME Nebulizer machine  Once    Question:  Patient needs a nebulizer to treat with the following condition  Answer:  COPD (chronic obstructive pulmonary disease) (HCC)   10/13/18 1150          Procedures/Studies: Ct Soft Tissue Neck W Contrast  Result Date: 10/09/2018 CLINICAL DATA:  68 year old male with a  history of wheezing and choking EXAM: CT CHEST AND NECK WITH CONTRAST TECHNIQUE: Multidetector CT imaging of the chest and neck was performed during intravenous contrast administration. CONTRAST:  OMNIPAQUE IOHEXOL 300 MG/ML  SOLN COMPARISON:  None. FINDINGS: Neck CT: Pharynx and Larynx: No evidence of mass. Parapharyngeal soft tissues: No abscess or focal fluid. No significant inflammatory changes. The parapharyngeal fat is essentially maintained. Left-sided carotid calcification. Salivary Glands: No mass, stone, or inflammation. Thyroid:  No concerning nodule. Lymph nodes:  No enlarged or necrotic appearing lymph nodes. Skeleton: Likely chronic nasal fracture. Chronic right-sided maxillary sinus disease with mucoperiosteal thickening and bone changes. Scant mucosal disease of the other visualized sinuses. The  patient is edentulous. Chest CT: Cardiovascular: Heart size within normal limits. No significant coronary vascular calcifications. Mild atherosclerotic changes of the thoracic aorta. Aorta measures 3.7 cm in the ascending. No periaortic fluid or inflammatory changes. Branch vessels remain patent. Diameter of the main pulmonary artery 2.3 cm. Mediastinum/Nodes: Unremarkable thoracic inlet. Small mediastinal lymph nodes. The length of the thoracic esophagus appears irregular with circumferential thickening in the upper mediastinum, mid segment, and just above the GE junction. Questionable irregular mucosal enhancement in the mid segment at the level of the azygos vein. Lungs/Pleura: Centrilobular and paraseptal emphysema with bullous femoris shin at the apices of the lungs. No pneumothorax. No pleural effusion. Mild endotracheal debris just above the carina, with minimal debris in the right mainstem bronchus. Mild bronchial wall thickening. No confluent airspace disease. No interlobular septal thickening. Respiratory motion somewhat limits evaluation. Upper Abdomen: No acute finding of the upper abdomen. Musculoskeletal: No acute displaced fracture. Mild degenerative changes of the thoracic spine. No bony canal narrowing. Focal sclerotic changes at the inferior endplates of T11 and T12 are favored to represent Schmorl's nodes/degenerative change. IMPRESSION: Neck CT: No acute finding. Mild paranasal sinus disease, including chronic changes of the right maxillary sinus. Chest CT: No acute CT finding. Irregular circumferential thickening along the length of the thoracic esophagus, suggesting esophagitis. Correlation with upper GI study and/or endoscopy recommended to rule out underlying malignancy. Advanced paraseptal and centrilobular emphysema. Mild amount of debris within the distal trachea and right mainstem bronchus, potentially representing aspiration. No evidence of pneumonia. Aortic Atherosclerosis  (ICD10-I70.0). Electronically Signed   By: Gilmer Mor D.O.   On: 10/09/2018 12:11   Ct Chest W Contrast  Result Date: 10/09/2018 CLINICAL DATA:  68 year old male with a history of wheezing and choking EXAM: CT CHEST AND NECK WITH CONTRAST TECHNIQUE: Multidetector CT imaging of the chest and neck was performed during intravenous contrast administration. CONTRAST:  OMNIPAQUE IOHEXOL 300 MG/ML  SOLN COMPARISON:  None. FINDINGS: Neck CT: Pharynx and Larynx: No evidence of mass. Parapharyngeal soft tissues: No abscess or focal fluid. No significant inflammatory changes. The parapharyngeal fat is essentially maintained. Left-sided carotid calcification. Salivary Glands: No mass, stone, or inflammation. Thyroid:  No concerning nodule. Lymph nodes:  No enlarged or necrotic appearing lymph nodes. Skeleton: Likely chronic nasal fracture. Chronic right-sided maxillary sinus disease with mucoperiosteal thickening and bone changes. Scant mucosal disease of the other visualized sinuses. The patient is edentulous. Chest CT: Cardiovascular: Heart size within normal limits. No significant coronary vascular calcifications. Mild atherosclerotic changes of the thoracic aorta. Aorta measures 3.7 cm in the ascending. No periaortic fluid or inflammatory changes. Branch vessels remain patent. Diameter of the main pulmonary artery 2.3 cm. Mediastinum/Nodes: Unremarkable thoracic inlet. Small mediastinal lymph nodes. The length of the thoracic esophagus appears irregular with circumferential thickening in  the upper mediastinum, mid segment, and just above the GE junction. Questionable irregular mucosal enhancement in the mid segment at the level of the azygos vein. Lungs/Pleura: Centrilobular and paraseptal emphysema with bullous femoris shin at the apices of the lungs. No pneumothorax. No pleural effusion. Mild endotracheal debris just above the carina, with minimal debris in the right mainstem bronchus. Mild bronchial wall  thickening. No confluent airspace disease. No interlobular septal thickening. Respiratory motion somewhat limits evaluation. Upper Abdomen: No acute finding of the upper abdomen. Musculoskeletal: No acute displaced fracture. Mild degenerative changes of the thoracic spine. No bony canal narrowing. Focal sclerotic changes at the inferior endplates of T11 and T12 are favored to represent Schmorl's nodes/degenerative change. IMPRESSION: Neck CT: No acute finding. Mild paranasal sinus disease, including chronic changes of the right maxillary sinus. Chest CT: No acute CT finding. Irregular circumferential thickening along the length of the thoracic esophagus, suggesting esophagitis. Correlation with upper GI study and/or endoscopy recommended to rule out underlying malignancy. Advanced paraseptal and centrilobular emphysema. Mild amount of debris within the distal trachea and right mainstem bronchus, potentially representing aspiration. No evidence of pneumonia. Aortic Atherosclerosis (ICD10-I70.0). Electronically Signed   By: Gilmer Mor D.O.   On: 10/09/2018 12:11   Dg Chest Port 1 View  Result Date: 10/09/2018 CLINICAL DATA:  Wheezing with shortness of breath for 2 weeks EXAM: PORTABLE CHEST 1 VIEW COMPARISON:  08/19/2018 FINDINGS: Normal heart size. Mild aortic tortuosity. Mild upper mediastinal widening that is stable. There is pending chest CT. There is no edema, consolidation, effusion, or pneumothorax. Mild hyperinflation. IMPRESSION: 1. No evidence of acute disease. 2. COPD. Electronically Signed   By: Marnee Spring M.D.   On: 10/09/2018 09:41   Dg Swallowing Func-speech Pathology  Result Date: 10/10/2018 Objective Swallowing Evaluation: Type of Study: MBS-Modified Barium Swallow Study  Patient Details Name: Shi Blankenship MRN: [id number] Date of Birth: 1950/11/16 Today's Date: 10/10/2018 Time: SLP Start Time (ACUTE ONLY): 1100 -SLP Stop Time (ACUTE ONLY): 1130 SLP Time Calculation (min) (ACUTE ONLY): 30  min Past Medical History: Past Medical History: Diagnosis Date . Asthma  . COPD (chronic obstructive pulmonary disease) (HCC)  . CVA (cerebral vascular accident) [facility]) 2001  [place] . Tobacco abuse  Past Surgical History: No past surgical history on file. HPI: Arthur Speagle is a 68 y.o. male with medical history significant of COPD, previous stroke, is brought to the hospital with wheezing, cough shortness of breath.  His family notes that for quite some time now (weeks to months) patient has had difficulty swallowing.  He has coughing episodes after he tries to eat or drink.  This occurs more with solids than liquids, but does also occur with liquids.  This resulted in him wheezing as well.  He has difficult time eating in this state and has had poor p.o. intake.  Family feels that he has lost 15 pounds in the last several months.  He denies any chest pain, vomiting, diarrhea.  He does have nausea.  He is not had any fever. BSE/MBS requested and SLP opted to complete MBSS due to h/o of dysphagia and PNA.  Subjective: "Alright! Assessment / Plan / Recommendation CHL IP CLINICAL IMPRESSIONS 10/10/2018 Clinical Impression Pt presents with moderate oropharyngeal phase dysphagia characterized by impaired lingual manipulation resulting in reduced bolus cohesiveness, piecemeal deglutition, and premature spillage; pharyngeal phase is characterized by delay in swallow initiation and reduced laryngeal closure resulting in penetration and aspiration during the swallow with thin liquids (cough but not removed)  between the arytenoids. Pt with penetration of nectars, but expelled before aspiration. Chin tuck was trialed and found to be effective when provided max cues from SLP (tsp thin with cues for chin tuck), he was not able to implement independently. Pt with prolonged oral transit with solid textures and delays in the valleculae for up to 7 seconds. Pt with questionable pharyngocele near valleculae which retained barium  during the study. Recommend D1/puree with NTL via cup sips, no straws, and supervision with meals. Pt can likely be upgraded to soft textures after caregiver education. No family present today and SLP made phone call, however voicemail box was full. SLP to follow. SLP Visit Diagnosis Dysphagia, oropharyngeal phase (R13.12) Attention and concentration deficit following -- Frontal lobe and executive function deficit following -- Impact on safety and function Moderate aspiration risk;Risk for inadequate nutrition/hydration   CHL IP TREATMENT RECOMMENDATION 10/10/2018 Treatment Recommendations Therapy as outlined in treatment plan below   Prognosis 10/10/2018 Prognosis for Safe Diet Advancement Fair Barriers to Reach Goals Severity of deficits Barriers/Prognosis Comment -- CHL IP DIET RECOMMENDATION 10/10/2018 SLP Diet Recommendations Dysphagia 1 (Puree) solids;Nectar thick liquid Liquid Administration via Cup;No straw Medication Administration Crushed with puree Compensations Slow rate Postural Changes Remain semi-upright after after feeds/meals (Comment);Seated upright at 90 degrees   CHL IP OTHER RECOMMENDATIONS 10/10/2018 Recommended Consults -- Oral Care Recommendations Oral care BID;Staff/trained caregiver to provide oral care Other Recommendations Order thickener from pharmacy;Prohibited food (jello, ice cream, thin soups);Clarify dietary restrictions   CHL IP FOLLOW UP RECOMMENDATIONS 10/10/2018 Follow up Recommendations 24 hour supervision/assistance   CHL IP FREQUENCY AND DURATION 10/10/2018 Speech Therapy Frequency (ACUTE ONLY) min 2x/week Treatment Duration 1 week      CHL IP ORAL PHASE 10/10/2018 Oral Phase Impaired Oral - Pudding Teaspoon -- Oral - Pudding Cup -- Oral - Honey Teaspoon Decreased bolus cohesion;Premature spillage Oral - Honey Cup -- Oral - Nectar Teaspoon Decreased bolus cohesion;Premature spillage Oral - Nectar Cup Decreased bolus cohesion;Premature spillage Oral - Nectar Straw -- Oral - Thin  Teaspoon Decreased bolus cohesion;Premature spillage Oral - Thin Cup Decreased bolus cohesion;Premature spillage Oral - Thin Straw NT Oral - Puree Delayed oral transit;Decreased bolus cohesion Oral - Mech Soft -- Oral - Regular Delayed oral transit;Decreased bolus cohesion;Impaired mastication;Weak lingual manipulation;Piecemeal swallowing Oral - Multi-Consistency -- Oral - Pill Reduced posterior propulsion;Delayed oral transit Oral Phase - Comment --  CHL IP PHARYNGEAL PHASE 10/10/2018 Pharyngeal Phase Impaired Pharyngeal- Pudding Teaspoon -- Pharyngeal -- Pharyngeal- Pudding Cup -- Pharyngeal -- Pharyngeal- Honey Teaspoon Delayed swallow initiation-vallecula Pharyngeal -- Pharyngeal- Honey Cup -- Pharyngeal -- Pharyngeal- Nectar Teaspoon Delayed swallow initiation-vallecula;Penetration/Aspiration during swallow;Pharyngeal residue - valleculae;Pharyngeal residue - pyriform Pharyngeal Material enters airway, remains ABOVE vocal cords then ejected out;Material does not enter airway Pharyngeal- Nectar Cup Delayed swallow initiation-vallecula;Delayed swallow initiation-pyriform sinuses;Penetration/Aspiration during swallow;Pharyngeal residue - valleculae;Pharyngeal residue - pyriform Pharyngeal Material does not enter airway;Material enters airway, remains ABOVE vocal cords then ejected out Pharyngeal- Nectar Straw -- Pharyngeal -- Pharyngeal- Thin Teaspoon Delayed swallow initiation-vallecula;Delayed swallow initiation-pyriform sinuses;Penetration/Aspiration during swallow;Reduced airway/laryngeal closure;Trace aspiration;Pharyngeal residue - valleculae;Pharyngeal residue - pyriform;Inter-arytenoid space residue;Compensatory strategies attempted (with notebox) Pharyngeal Material enters airway, passes BELOW cords and not ejected out despite cough attempt by patient;Material enters airway, remains ABOVE vocal cords then ejected out Pharyngeal- Thin Cup NT Pharyngeal -- Pharyngeal- Thin Straw -- Pharyngeal -- Pharyngeal-  Puree Delayed swallow initiation-vallecula Pharyngeal -- Pharyngeal- Mechanical Soft -- Pharyngeal -- Pharyngeal- Regular Delayed swallow initiation-vallecula;Pharyngeal residue - valleculae Pharyngeal -- Pharyngeal- Multi-consistency --  Pharyngeal -- Pharyngeal- Pill Pharyngeal residue - valleculae Pharyngeal -- Pharyngeal Comment --  CHL IP CERVICAL ESOPHAGEAL PHASE 10/10/2018 Cervical Esophageal Phase WFL Pudding Teaspoon -- Pudding Cup -- Honey Teaspoon -- Honey Cup -- Nectar Teaspoon -- Nectar Cup -- Nectar Straw -- Thin Teaspoon -- Thin Cup -- Thin Straw -- Puree -- Mechanical Soft -- Regular -- Multi-consistency -- Pill -- Cervical Esophageal Comment -- Thank you, Havery Moros, CCC-SLP [phone] PORTER,DABNEY 10/10/2018, 8:56 PM                 Subjective: Pt without complaints, tolerated EGD very well.  Tolerating dysphagia diet.    Discharge Exam: Vitals:   10/13/18 1014 10/13/18 1115  BP: 126/79 (!) 156/95  Pulse: 65 60  Resp: 12 20  Temp: 97.7 F (36.5 C)   SpO2: 100% 100%   Vitals:   10/13/18 0734 10/13/18 0850 10/13/18 1014 10/13/18 1115  BP:  (!) 157/93 126/79 (!) 156/95  Pulse:  60 65 60  Resp:  Temp:  97.9 F (36.6 C) 97.7 F (36.5 C)   TempSrc:  Oral Oral   SpO2: (!) 89% 90% 100% 100%  Weight:      Height:        General: Pt is emaciated, alert, awake, not in acute distress, chronically ill appearing.  Cardiovascular: normal s1/S2 +, no rubs, no gallops Respiratory: CTA bilaterally, no wheezing, no rhonchi Abdominal: Soft, NT, ND, bowel sounds + Extremities: no edema, no cyanosis   The results of significant diagnostics from this hospitalization (including imaging, microbiology, ancillary and laboratory) are listed below for reference.     Microbiology: Recent Results (from the past 240 hour(s))  Culture, blood (routine x 2)     Status: None (Preliminary result)   Collection Time: 10/09/18 12:40 PM  Result Value Ref Range Status   Specimen  Description BLOOD LEFT ARM  Final   Special Requests   Final    BOTTLES DRAWN AEROBIC ONLY Blood Culture results may not be optimal due to an inadequate volume of blood received in culture bottles   Culture   Final    NO GROWTH 4 DAYS Performed at [facility], [address] [place], [place] [address]    Report Status PENDING  Incomplete  Culture, blood (routine x 2)     Status: None (Preliminary result)   Collection Time: 10/09/18 12:52 PM  Result Value Ref Range Status   Specimen Description BLOOD RIGHT HAND  Final   Special Requests   Final    BOTTLES DRAWN AEROBIC AND ANAEROBIC Blood Culture results may not be optimal due to an inadequate volume of blood received in culture bottles   Culture   Final    NO GROWTH 4 DAYS Performed at [facility], [address] [place], [place] [address]    Report Status PENDING  Incomplete     Labs: BNP (last 3 results) Recent Labs    10/09/18 0937  BNP 37.0   Basic Metabolic Panel: Recent Labs  Lab 10/09/18 0937 10/10/18 0707 10/11/18 0450  NA 142 143 146*  K 3.7 3.8 3.7  CL 104 112* 115*  CO2 GLUCOSE 129* 139* 139*  BUN CREATININE 1.17 0.94 1.01  CALCIUM 8.9 8.1* 8.6*   Liver Function Tests: Recent Labs  Lab 10/10/18 0707  AST 16  ALT 10  ALKPHOS 46  BILITOT 0.4  PROT 6.2*  ALBUMIN 3.0*   No results for  input(s): LIPASE, AMYLASE in the last 168 hours. No results for input(s): AMMONIA in the last 168 hours. CBC: Recent Labs  Lab 10/09/18 0937 10/10/18 0707  WBC 4.3 7.3  NEUTROABS 2.1  --   HGB 14.4 12.3*  HCT 44.6 37.7*  MCV 87.6 87.3  PLT 263 211   Cardiac Enzymes: Recent Labs  Lab 10/09/18 0937  TROPONINI <0.03   BNP: Invalid input(s): POCBNP CBG: No results for input(s): GLUCAP in the last 168 hours. D-Dimer No results for input(s): DDIMER in the last 72 hours. Hgb A1c No results for input(s): HGBA1C in the last 72 hours. Lipid Profile No results for input(s): CHOL, HDL,  LDLCALC, TRIG, CHOLHDL, LDLDIRECT in the last 72 hours. Thyroid function studies No results for input(s): TSH, T4TOTAL, T3FREE, THYROIDAB in the last 72 hours.  Invalid input(s): FREET3 Anemia work up No results for input(s): VITAMINB12, FOLATE, FERRITIN, TIBC, IRON, RETICCTPCT in the last 72 hours. Urinalysis    Component Value Date/Time   COLORURINE YELLOW 08/19/2018 1334   APPEARANCEUR CLEAR 08/19/2018 1334   LABSPEC 1.010 08/19/2018 1334   PHURINE 7.0 08/19/2018 1334   GLUCOSEU NEGATIVE 08/19/2018 1334   HGBUR NEGATIVE 08/19/2018 1334   BILIRUBINUR NEGATIVE 08/19/2018 1334   KETONESUR NEGATIVE 08/19/2018 1334   PROTEINUR NEGATIVE 08/19/2018 1334   UROBILINOGEN 0.2 06/26/2011 0606   NITRITE NEGATIVE 08/19/2018 1334   LEUKOCYTESUR TRACE (A) 08/19/2018 1334   Sepsis Labs Invalid input(s): PROCALCITONIN,  WBC,  LACTICIDVEN Microbiology Recent Results (from the past 240 hour(s))  Culture, blood (routine x 2)     Status: None (Preliminary result)   Collection Time: 10/09/18 12:40 PM  Result Value Ref Range Status   Specimen Description BLOOD LEFT ARM  Final   Special Requests   Final    BOTTLES DRAWN AEROBIC ONLY Blood Culture results may not be optimal due to an inadequate volume of blood received in culture bottles   Culture   Final    NO GROWTH 4 DAYS Performed at [facility], [address], [place], [place] [address]    Report Status PENDING  Incomplete  Culture, blood (routine x 2)     Status: None (Preliminary result)   Collection Time: 10/09/18 12:52 PM  Result Value Ref Range Status   Specimen Description BLOOD RIGHT HAND  Final   Special Requests   Final    BOTTLES DRAWN AEROBIC AND ANAEROBIC Blood Culture results may not be optimal due to an inadequate volume of blood received in culture bottles   Culture   Final    NO GROWTH 4 DAYS Performed at [facility], [address], [place], [place] [address]    Report Status PENDING  Incomplete   Time coordinating  discharge: 36 minutes   SIGNED:  Standley Dakins, MD  [facility] 10/13/2018, 11:52 AM How to contact the [facility] Attending or Consulting provider 7A - 7P or covering provider during after hours 7P -7A, for this patient?  1. Check the care team in [facility] and look for a) attending/consulting TRH provider listed and b) the [facility] team listed 2. Log into [email or web address] and use [facility] universal password to access. If you do not have the password, please contact the hospital operator. 3. Locate the [facility] provider you are looking for under [facility] and page to a number that you can be directly reached. 4. If you still have difficulty reaching the provider, please page the [facility] (Director on Call) for the Hospitalists listed on amion for assistance.

## 2018-10-13 NOTE — TOC Transition Note (Addendum)
Transition of Care [facility]) - CM/SW Discharge Note   Patient Details  Name: Jose Schaefer MRN: [id number] Date of Birth: 10-17-1950  Transition of [facility]) CM/SW Contact:  Virgel Manifold, RN Phone Number: 10/13/2018, 2:54 PM   Clinical Narrative:   Patient to discharge with home O2/nebulizer. Orders in place from MD. Qualifying sats placed by RN. Referral to [facility] for DME.   Patient has been discharged before  O2 arrival. Notified [facility] and they have scheduled home delivery ASAP. Left voicemail on patient phone to inform them of delivery plans.     Final next level of care: Home/Self Care Barriers to Discharge: Barriers Resolved   Patient Goals and CMS Choice Patient states their goals for this hospitalization and ongoing recovery are:: Go back home, see an outpt Dr CMS Medicare.gov Compare Post Acute Care list provided to:: Other (Comment Required)(No list provided) Choice offered to / list presented to : NA(see above)  Discharge Placement                       Discharge Plan and Services In-house Referral: PCP / [facility] Discharge Planning Services: Follow-up appt scheduled Post Acute Care Choice: NA                    Social Determinants of Health (SDOH) Interventions     Readmission Risk Interventions No flowsheet data found.

## 2018-10-13 NOTE — Progress Notes (Signed)
SATURATION QUALIFICATIONS: (This note is used to comply with regulatory documentation for home oxygen)  Patient Saturations on Room Air at Rest = 91%  Patient Saturations on Room Air while Ambulating = 78%  Patient Saturations on 2 Liters of oxygen while Ambulating = 94%  Please briefly explain why patient needs home oxygen: Pt desats with minimal exertion.

## 2018-10-13 NOTE — Transfer of Care (Signed)
Immediate Anesthesia Transfer of Care Note  Patient: Jose Schaefer  Procedure(s) Performed: ESOPHAGOGASTRODUODENOSCOPY (EGD) (N/A ) ESOPHAGEAL DILATION (N/A )  Patient Location: PACU  Anesthesia Type:General  Level of Consciousness: awake  Airway & Oxygen Therapy: Patient connected to nasal cannula oxygen  Post-op Assessment: Report given to RN  Post vital signs: Reviewed and stable  Last Vitals:  Vitals Value Taken Time  BP 126/79 10/13/2018 10:14 AM  Temp 36.5 C 10/13/2018 10:14 AM  Pulse 65 10/13/2018 10:14 AM  Resp 12 10/13/2018 10:14 AM  SpO2 100 % 10/13/2018 10:14 AM    Last Pain:  Vitals:   10/13/18 1014  TempSrc: Oral  PainSc:          Complications: No apparent anesthesia complications

## 2018-10-13 NOTE — Op Note (Addendum)
[facility] Patient Name: Jose Schaefer Procedure Date: 10/13/2018 8:49 AM MRN: [id number] Date of Birth: Nov 17, 1950 Attending MD: Jonette Eva MD, MD CSN: [id number] Age: 68 Admit Type: Inpatient Procedure:                Upper GI endoscopy WITH COLD FORCEPS                            BIOPSY/ESOPHAGEAL DILATION Indications:              Dysphagia, Abnormal CT of the GI tract Providers:                Jonette Eva MD, MD, Jannett Celestine, RN, Burke Keels, Technician Referring MD:             Duffy Rhody, FNP Medicines:                Propofol per Anesthesia Complications:            No immediate complications. Estimated Blood Loss:     Estimated blood loss was minimal. Procedure:                Pre-Anesthesia Assessment:                           - Prior to the procedure, a History and Physical                            was performed, and patient medications and                            allergies were reviewed. The patient's tolerance of                            previous anesthesia was also reviewed. The risks                            and benefits of the procedure and the sedation                            options and risks were discussed with the patient.                            All questions were answered, and informed consent                            was obtained. Prior Anticoagulants: The patient has                            taken heparin, last dose was day of procedure. ASA                            Grade Assessment: III - A patient with severe  systemic disease. After reviewing the risks and                            benefits, the patient was deemed in satisfactory                            condition to undergo the procedure. After obtaining                            informed consent, the endoscope was passed eaasily                            into the esophagus and passed under direct vision.                        Throughout the procedure, the patient's blood                            pressure, pulse, and oxygen saturations were                            monitored continuously. The [id number]-[id number] ([id number])                            scope was introduced through the mouth, and                            advanced to the second part of duodenum. The upper                            GI endoscopy was accomplished without difficulty.                            The patient tolerated the procedure well. Scope In: 9:47:17 AM Scope Out: 9:55:44 AM Total Procedure Duration: 0 hours 8 minutes 27 seconds  Findings:      One benign-appearing, intrinsic moderate (circumferential scarring or       stenosis; an endoscope may pass) stenosis was found. This stenosis       measured 1.4 cm (inner diameter). The stenosis was traversed. A       guidewire was placed and the scope was withdrawn. Dilation was performed       with a Savary dilator with mild resistance at 14 mm, 15 mm, 16 mm and 17       mm. NO MASS APPRECIATED IN PROXIMAL OR DISTAL ESOPHAGUS.      A medium-sized hiatal hernia was present.      Patchy mild inflammation characterized by congestion (edema) and       erythema was found on the greater curvature of the stomach and in the       gastric antrum. Biopsies(2: BODY, 1:INCISURA, 2: ANTRUM) were taken with       a cold forceps for Helicobacter pylori testing.      The examined duodenum was normal. Impression:               - Benign-appearing esophageal STRICTURE.                           -  Medium-sized hiatal hernia.                           - MILD Gastritis. Biopsied.                           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 Moderate Sedation:      Per Anesthesia Care Recommendation:           - Patient has a contact number available for                            emergencies. The signs and symptoms of potential                             delayed complications were discussed with the                            patient. Return to normal activities tomorrow.                            Written discharge instructions were provided to the                            patient.                           - Nectar thick liquids diet.                           - Continue present medications. COMPLETE 21 DAYS OF                            DIFLUCAN.                           - Await pathology results.                           - Return to GI office in 4 months. Procedure Code(s):        --- Professional ---                           [phone], Esophagogastroduodenoscopy, flexible,                            transoral; with insertion of guide wire followed by                            passage of dilator(s) through esophagus over guide                            wire  [address], 59, Esophagogastroduodenoscopy, flexible,                            transoral; with biopsy, single or multiple Diagnosis Code(s):        --- Professional ---                           K22.2, Esophageal obstruction                           K44.9, Diaphragmatic hernia without obstruction or                            gangrene                           K29.70, Gastritis, unspecified, without bleeding                           R13.10, Dysphagia, unspecified                           R93.3, Abnormal findings on diagnostic imaging of                            other parts of digestive tract CPT copyright 2018 American Medical Association. All rights reserved. The codes documented in this report are preliminary and upon coder review may  be revised to meet current compliance requirements. Jonette Eva, MD Jonette Eva MD, MD 10/13/2018 10:20:06 AM This report has been signed electronically. Number of Addenda: 0

## 2018-10-13 NOTE — Progress Notes (Signed)
Speech Language Pathology Treatment: Dysphagia  Patient Details Name: Jose Schaefer MRN: [id number] DOB: 08-11-1950 Today's Date: 10/13/2018 Time: 1212-1231 SLP Time Calculation (min) (ACUTE ONLY): 19 min  Assessment / Plan / Recommendation Clinical Impression  Pt seen at bedside with lunch meal of puree and NTL; sister present in room. Pt underwent EGD with dilation this AM with Dr. Darrick Penna with findings of stricture and hiatal hernia. Pt also being treated for candidaesophagitis . SLP reviewed recommendations with family and spoke at length with his daughter on the phone regarding recommendations for thickened liquids. Pt's daughter has already called [facility] for thicken up clear. If Pt discharges, please send his thickener home with him. He would benefit from [facility] SLP therapy to ensure carryover of recommendations at home. Recommend D1/puree and NTL, no straws. SLP explained to daughter that once Pt is at home, he may have soft solids that are mashable with a fork with added moisture and with supervision. Dtr understands and was appreciative of phone call.    HPI HPI: Jose Schaefer is a 68 y.o. male with medical history significant of COPD, previous stroke, is brought to the hospital with wheezing, cough shortness of breath.  His family notes that for quite some time now (weeks to months) patient has had difficulty swallowing.  He has coughing episodes after he tries to eat or drink.  This occurs more with solids than liquids, but does also occur with liquids.  This resulted in him wheezing as well.  He has difficult time eating in this state and has had poor p.o. intake.  Family feels that he has lost 15 pounds in the last several months.  He denies any chest pain, vomiting, diarrhea.  He does have nausea.  He is not had any fever. BSE/MBS requested and SLP opted to complete MBSS due to h/o of dysphagia and PNA.      SLP Plan  Continue with current plan of care       Recommendations   Diet recommendations: Dysphagia 1 (puree);Nectar-thick liquid Liquids provided via: Cup;No straw;Teaspoon Medication Administration: Whole meds with puree Supervision: Patient able to self feed;Full supervision/cueing for compensatory strategies Compensations: Slow rate;Small sips/bites Postural Changes and/or Swallow Maneuvers: Seated upright 90 degrees;Upright 30-60 min after meal                Oral Care Recommendations: Oral care BID;Staff/trained caregiver to provide oral care Follow up Recommendations: Home health SLP SLP Visit Diagnosis: Dysphagia, oropharyngeal phase (R13.12) Plan: Continue with current plan of care       Thank you,  Jose Schaefer, CCC-SLP [phone]                 Curtis Cain 10/13/2018, 12:48 PM

## 2018-10-13 NOTE — Progress Notes (Signed)
Pt returned to room from PACU.  He is alert and talking.

## 2018-10-13 NOTE — Interval H&P Note (Signed)
History and Physical Interval Note:  10/13/2018 9:04 AM  Jose Schaefer  has presented today for surgery, with the diagnosis of DYSPHAGIA.  The various methods of treatment have been discussed with the patient and family. After consideration of risks, benefits and other options for treatment, the patient has consented to  Procedure(s): ESOPHAGOGASTRODUODENOSCOPY (EGD) (N/A) ESOPHAGEAL DILATION (N/A) as a surgical intervention.  The patient's history has been reviewed, patient examined, no change in status, stable for surgery.  I have reviewed the patient's chart and labs.  Questions were answered to the patient's satisfaction.     [employer]

## 2018-10-13 NOTE — Anesthesia Preprocedure Evaluation (Addendum)
Anesthesia Evaluation    Airway Mallampati: II       Dental  (+) Missing, Poor Dentition   Pulmonary asthma , pneumonia, COPD,  oxygen dependent, Current Smoker,     + decreased breath sounds      Cardiovascular Normal cardiovascular exam Rate:Bradycardia     Neuro/Psych CVA    GI/Hepatic   Endo/Other    Renal/GU      Musculoskeletal   Abdominal   Peds  Hematology   Anesthesia Other Findings Pt is a pleasantly demented COPD pt on o2 s/p recent exacerbation of COPD probably  related to dysphagia and aspiration. Pt is oriented x1.  Currently in No apparent distress , denies any current dysphagia with dietary modification. Unable to reach daughter for consent. Need to discuss with daughter possibility of ventilation post procedure.  D/W Dr. Darrick Penna.  D/w daughter at length with Dr. Darrick Penna present. Both WTP.  Reproductive/Obstetrics                           Anesthesia Physical Anesthesia Plan  ASA: IV and emergent  Anesthesia Plan: General   Post-op Pain Management:    Induction:   PONV Risk Score and Plan:   Airway Management Planned: Simple Face Mask  Additional Equipment:   Intra-op Plan:   Post-operative Plan:   Informed Consent:   Plan Discussed with:   Anesthesia Plan Comments: (D/W daughter poss post op ventilation - WTP Dr. Darrick Penna deemed this case emergent in the face of current Coronavirus concerns )        Anesthesia Quick Evaluation

## 2018-10-13 NOTE — Progress Notes (Signed)
Pt to endo. 

## 2018-10-13 NOTE — Anesthesia Postprocedure Evaluation (Signed)
Anesthesia Post Note  Patient: Jose Schaefer  Procedure(s) Performed: ESOPHAGOGASTRODUODENOSCOPY (EGD) (N/A ) ESOPHAGEAL DILATION (N/A )  Patient location during evaluation: PACU Anesthesia Type: General Level of consciousness: awake Pain management: pain level controlled Vital Signs Assessment: post-procedure vital signs reviewed and stable Respiratory status: spontaneous breathing, nonlabored ventilation and patient connected to nasal cannula oxygen Cardiovascular status: blood pressure returned to baseline Postop Assessment: no headache Anesthetic complications: no     Last Vitals:  Vitals:   10/13/18 0850 10/13/18 1014  BP: (!) 157/93 126/79  Pulse: 60 65  Resp: 14 12  Temp: 36.6 C 36.5 C  SpO2: 90% 100%    Last Pain:  Vitals:   10/13/18 1014  TempSrc: Oral  PainSc:                  Dorena Cookey

## 2018-10-13 NOTE — Discharge Instructions (Signed)
IMPORTANT INFORMATION: PAY CLOSE ATTENTION   PHYSICIAN DISCHARGE INSTRUCTIONS  Follow with Primary care provider  Duffy Rhody, FNP  and other consultants as instructed your Hospitalist Physician  SEEK MEDICAL CARE OR RETURN TO EMERGENCY ROOM IF SYMPTOMS COME BACK, WORSEN OR NEW PROBLEM DEVELOPS.   Please note: You were cared for by a hospitalist during your hospital stay. Every effort will be made to forward records to your primary care provider.  You can request that your primary care provider send for your hospital records if they have not received them.  Once you are discharged, your primary care physician will handle any further medical issues. Please note that NO REFILLS for any discharge medications will be authorized once you are discharged, as it is imperative that you return to your primary care physician (or establish a relationship with a primary care physician if you do not have one) for your post hospital discharge needs so that they can reassess your need for medications and monitor your lab values.  Please get a complete blood count and chemistry panel checked by your Primary MD at your next visit, and again as instructed by your Primary MD.  Get Medicines reviewed and adjusted: Please take all your medications with you for your next visit with your Primary MD  Laboratory/radiological data: Please request your Primary MD to go over all hospital tests and procedure/radiological results at the follow up, please ask your primary care provider to get all Hospital records sent to his/her office.  In some cases, they will be blood work, cultures and biopsy results pending at the time of your discharge. Please request that your primary care provider follow up on these results.  If you are diabetic, please bring your blood sugar readings with you to your follow up appointment with primary care.    Please call and make your follow up appointments as soon as possible.    Also  Note the following: If you experience worsening of your admission symptoms, develop shortness of breath, life threatening emergency, suicidal or homicidal thoughts you must seek medical attention immediately by calling 911 or calling your MD immediately  if symptoms less severe.  You must read complete instructions/literature along with all the possible adverse reactions/side effects for all the Medicines you take and that have been prescribed to you. Take any new Medicines after you have completely understood and accpet all the possible adverse reactions/side effects.   Do not drive when taking Pain medications or sleeping medications (Benzodiazepines)  Do not take more than prescribed Pain, Sleep and Anxiety Medications. It is not advisable to combine anxiety,sleep and pain medications without talking with your primary care practitioner  Special Instructions: If you have smoked or chewed Tobacco  in the last 2 yrs please stop smoking, stop any regular Alcohol  and or any Recreational drug use.  Wear Seat belts while driving.        Dysphagia  Dysphagia is trouble swallowing. This condition occurs when solids and liquids stick in a person's throat on the way down to the stomach, or when food takes longer to get to the stomach. You may have problems swallowing food, liquids, or both. You may also have pain while trying to swallow. It may take you more time and effort to swallow something. What are the causes? This condition is caused by:  Problems with the muscles. They may make it difficult for you to move food and liquids through the tube that connects your mouth to your stomach (  esophagus). You may have ulcers, scar tissue, or inflammation that blocks the normal passage of food and liquids. Causes of these problems include: ? Acid reflux from your stomach into your esophagus (gastroesophageal reflux). ? Infections. ? Radiation treatment for cancer. ? Medicines taken without enough  fluids to wash them down into your stomach.  Nerve problems. These prevent signals from being sent to the muscles of your esophagus to squeeze (contract) and move what you swallow down to your stomach.  Globus pharyngeus. This is a common problem that involves feeling like something is stuck in the throat or a sense of trouble with swallowing even though nothing is wrong with the swallowing passages.  Stroke. This can affect the nerves and make it difficult to swallow.  Certain conditions, such as cerebral palsy or Parkinson disease. What are the signs or symptoms? Common symptoms of this condition include:  A feeling that solids or liquids are stuck in your throat on the way down to the stomach.  Food taking too long to get to the stomach. Other symptoms include:  Food moving back from your stomach to your mouth (regurgitation).  Noises coming from your throat.  Chest discomfort with swallowing.  A feeling of fullness when swallowing.  Drooling, especially when the throat is blocked.  Pain while swallowing.  Heartburn.  Coughing or gagging while trying to swallow. How is this diagnosed? This condition is diagnosed by:  Barium X-ray. In this test, you swallow a white substance (contrast medium)that sticks to the inside of your esophagus. X-ray images are then taken.  Endoscopy. In this test, a flexible telescope is inserted down your throat to look at your esophagus and your stomach.  CT scans and MRI. How is this treated? Treatment for dysphagia depends on the cause of the condition:  If the dysphagia is caused by acid reflux or infection, medicines may be used. They may include antibiotics and heartburn medicines.  If the dysphagia is caused by problems with your muscles, swallowing therapy may be used to help you strengthen your swallowing muscles. You may have to do specific exercises to strengthen the muscles or stretch them.  If the dysphagia is caused by a  blockage or mass, procedures to remove the blockage may be done. You may need surgery and a feeding tube. You may need to make diet changes. Ask your health care provider for specific instructions. Follow these instructions at home: Eating and drinking  Try to eat soft food that is easier to swallow.  Follow any diet changes as told by your health care provider.  Cut your food into small pieces and eat slowly.  Eat and drink only when you are sitting upright.  Do not drink alcohol or caffeine. If you need help quitting, ask your health care provider. General instructions  Check your weight every day to make sure you are not losing weight.  Take over-the-counter and prescription medicines only as told by your health care provider.  If you were prescribed an antibiotic medicine, take it as told by your health care provider. Do not stop taking the antibiotic even if you start to feel better.  Do not use any products that contain nicotine or tobacco, such as cigarettes and e-cigarettes. If you need help quitting, ask your health care provider.  Keep all follow-up visits as told by your health care provider. This is important. Contact a health care provider if:  You lose weight because you cannot swallow.  You cough when you drink liquids (  aspiration).  You cough up partially digested food. Get help right away if:  You cannot swallow your saliva.  You have shortness of breath or a fever, or both.  You have a hoarse voice and also have trouble swallowing. Summary  Dysphagia is trouble swallowing. This condition occurs when solids and liquids stick in a person's throat on the way down to the stomach, or when food takes longer to get to the stomach.  Dysphagia has many possible causes and symptoms.  Treatment for dysphagia depends on the cause of the condition. This information is not intended to replace advice given to you by your health care provider. Make sure you discuss any  questions you have with your health care provider. Document Released: 07/08/2000 Document Revised: 06/30/2016 Document Reviewed: 06/30/2016 Elsevier Interactive Patient Education  2019 [employer] Inc[employer]   Dysphagia Eating Plan, Pureed This diet is helpful for people with moderate to severe swallowing problems. Pureed foods are smooth and are prepared without lumps so that they can be swallowed safely. Work with your health care provider and your diet and nutrition specialist (dietitian) to make sure that you are following the diet safely and getting all the nutrients you need. What are tips for following this plan? General instructions  You may eat foods that are soft and have a pudding-like texture.  Do not eat foods that you have to chew. If you have to chew the food, then you cannot eat it.  Avoid foods that are hard, dry, sticky, chunky, lumpy, or stringy. Also avoid foods with nuts, seeds, raisins, skins, or pulp.  You may be instructed to thicken liquids. Follow your health care provider's instructions about how to do this and to what consistency. Cooking   If a food is not originally a smooth texture, you may be able to eat the food after: ? Pureeing it. This can be done with a blender. ? Moistening it. This can be done by adding juice, cooking liquid, gravy, or sauce to a dry food and then pureeing it. For example, you may have bread if you soak it in milk and puree it.  If a food is too thin, you may add a commercial thickener, corn starch, rice cereal, or potato flakes to thicken it.  Strain and throw away any liquid that separates from a solid pureed food before eating.  Strain lumps, chunks, pulp, and seeds from pureed foods before eating.  Reheat foods slowly to prevent a tough crust from forming. Meal planning  Eat a variety of foods to get all the nutrients you need.  Add dry milk or protein powder to food to increase calories and protein content.  Follow your meal  plan as told by your dietitian. What foods are allowed? The items listed may not be a complete list. Talk with your dietitian about what dietary choices are best for you. Grains Soft breads, pancakes, [place] toast, muffins, and bread stuffing pureed to a smooth, moist texture, without nuts or seeds. Cooked cereals that have a pudding-like consistency, such as cream of wheat or farina. Pureed oatmeal. Pureed, well-cooked pasta and rice. Vegetables Pureed vegetables. Smooth tomato paste or sauce. Mashed or pureed potatoes without skin. Fruits Pureed fruits such as melons and apples without seeds or pulp. Mashed bananas. Mashed avocado. Fruit juices without pulp or seeds. Meats and other protein foods Pureed meat, poultry, and fish. Smooth pate or liverwurst. Smooth souffles. Pureed beans (such as lentils). Pureed eggs. Smooth nut and seed butters. Pureed tofu. Dairy  Yogurt. Milk. Pureed cottage cheese. Nutritional dairy drinks or shakes. Cream cheese. Smooth pudding, ice cream, sherbet, and malts. Fats and oils Butter. Margarine. Vegetable oils. Smooth and strained gravy. Sour cream. Mayonnaise. Smooth sauces such as white sauce, cheese sauce, or hollandaise sauce. Sweets and desserts Moistened and pureed cookies and cakes. Whipped topping. Gelatin. Pudding pops. Seasoning and other foods Finely ground spices. Jelly. Honey. Pureed casseroles. Strained soups. Pureed sandwiches. Beverages Anything prepared at the consistency recommended by your dietitian. What foods are not allowed? The items listed may not be a complete list. Talk with your dietitian about what dietary choices are best for you. Grains Oatmeal. Dry cereals. Hard breads. Breads with seeds or nuts. Whole pasta, rice, or other grains. Whole pancakes, waffles, biscuits, muffins, or rolls. Vegetables Whole vegetables. Stringy vegetables (such as celery). Tomatoes or tomato sauce with seeds. Fried vegetables. Fruits Whole fresh,  frozen, canned, or dried fruits that have not been pureed. Stringy fruits, such as pineapple or coconut. Watermelon with seeds. Dried fruit or fruit leather. Meat and other protein foods Whole or ground meat, fish, or poultry. Dried or cooked lentils or legumes that have been cooked but not mashed or pureed. Non-pureed eggs. Nuts and seeds. Crunchy peanut butter. Whole tofu or other meat alternatives. Dairy Cheese cubes or slices. Non-pureed cottage cheese. Yogurt with fruit chunks. Fats and oils All fats and sauces that have lumps or chunks. Sweets and desserts Solid desserts. Sticky, chewy sweets (such as licorice and caramel). Candy with nuts or coconut. Seasoning and other foods Coarse or seeded herbs and spices. Chunky preserves. Jams with seeds. Whole sandwiches. Non-pureed casseroles. Chunky soups. Summary  Pureed foods can be helpful for people with moderate to severe swallowing problems.  On the dysphagia eating plan, you may eat foods that are soft and have a pudding-like texture. You should avoid foods that you have to chew. If you have to chew the food, then you cannot eat it.  You may be instructed to thicken liquids. Follow your health care provider's instructions about how to do this and to what consistency. This information is not intended to replace advice given to you by your health care provider. Make sure you discuss any questions you have with your health care provider. Document Released: 07/11/2005 Document Revised: 09/13/2016 Document Reviewed: 09/13/2016 Elsevier Interactive Patient Education  2019 [employer] Inc. Thickening Liquids for Dysphagia Diet If you are on the dysphagia diet, you may need to thicken liquids, including drinks, soups, and foods that melt at room temperature. Thickening liquids makes them easier to swallow. It also reduces the risk of liquid traveling to your lungs. How do I thicken a liquid? To thicken a liquid, you will need to add a commercial  food thickening product or a soft food to the liquid until it reaches the desired consistency. Liquid consistencies include: Thin and slightly thick. These liquids include most drinks (such as water, milk, tea, soda, juice, carbonated drinks), as well as melted ice cream, sherbet, sorbet and ice pops, and broth-based soups. Mild to moderately thick. This includes nectar-like liquids such as maple syrup and creamy soup, and honey-like liquids. These liquids are runny, but thick enough that they cannot be sipped through a straw. Extremely thick. Also called spoon-thick, these liquids are thick like pudding. How to thicken a liquid with a commercial food and beverage thickener  A commercial food and beverage thickener is a powder or gel that makes a food or beverage thicker. You can buy thickeners at  pharmacies, medical supply stores, some grocery stores, and online. They can be added to both hot and cold liquids and do not change the taste of the liquid. Ask your health care provider or dietitian for a complete list of commercial thickeners. Each thickening product is different. Some need to be blended into a liquid with a blender while others can be stirred into a liquid with a fork or spoon. To thicken a liquid using a thickening product, follow the instructions on the product label. How to thicken a liquid with soft foods Some foods such as soups, casseroles, and gravies can be thickened with soft foods. Soft foods include: Baby cereal. Gravy powder. Mashed potato. Pureed baby food. Instant potato flakes. Powdered sauce mixes (such as cheese mixes). Flour or corn starch. To thicken a liquid using one of these soft food items, stir or mix the soft food into the liquid until it reaches the desired thickness. Start with a small amount and make adjustments as necessary. Note that flour and corn starch work best with warm liquids, such as broth. To thicken a liquid with flour or corn starch: Make a  paste out of flour and water. Cook or warm the liquid and add the paste to it. Stir until the mixture thickens. What are some tips for using thickened liquids? Take thickeners with you when eating out or traveling. If a liquid gets too thick, add more of the thinner liquid until the desired consistency is reached. Consider purchasing premade thickened drinks. Consider using a thickening product to make your own frozen desserts. What are tips for following this plan? I should thicken my liquids to a _______________ consistency. Follow your health care provider or dietitian's recommendation on how to thicken your liquids. Thicken liquids as told by your health care provider or nutrition specialist (dietitian). See your health care provider or dietitian regularly for help with your dietary changes. Summary Thickening liquids makes them easier to swallow. It also reduces the risk of liquid traveling to your lungs. Thicken liquids as told by your health care provider or dietitian. You can thicken a liquid using a commercial thickening product or by adding a soft food to the liquid until it reaches the desired consistency. This information is not intended to replace advice given to you by your health care provider. Make sure you discuss any questions you have with your health care provider. Document Released: 01/10/2012 Document Revised: 09/14/2016 Document Reviewed: 09/14/2016 Elsevier Interactive Patient Education  2019 [employer].

## 2018-10-14 LAB — CULTURE, BLOOD (ROUTINE X 2)
Culture: NO GROWTH
Culture: NO GROWTH

## 2018-10-15 ENCOUNTER — Ambulatory Visit: Payer: Medicare Other | Admitting: Gastroenterology

## 2018-10-15 ENCOUNTER — Encounter (HOSPITAL_COMMUNITY): Payer: Self-pay | Admitting: Gastroenterology

## 2018-10-16 ENCOUNTER — Telehealth: Payer: Self-pay | Admitting: Gastroenterology

## 2018-10-16 NOTE — Telephone Encounter (Signed)
Pt's daughter, [name], was informed. She said they were supposed to make a follow up appt. Forwarding to [place] to schedule.

## 2018-10-16 NOTE — Telephone Encounter (Signed)
  Please call pt'S DAUGHTER. HIS stomach Bx shows gastritis.

## 2018-10-17 NOTE — Telephone Encounter (Signed)
Jose Schaefer is aware to call if questions or problems. [name] pt is doing much better at this time. She is aware that [name] will send an appt for around 2 months.

## 2018-10-17 NOTE — Telephone Encounter (Signed)
When should they have a follow up appointment and can it be with anyone?

## 2018-10-17 NOTE — Telephone Encounter (Signed)
FOLLOW UP IN 2 MOS WITH DR. Vidalia Serpas. Please CALL or SEND me A MY CHART MESSAGE IF SHE HAS QUESTIONS OR CONCERNS.

## 2018-10-18 ENCOUNTER — Encounter: Payer: Self-pay | Admitting: Gastroenterology

## 2018-10-18 NOTE — Telephone Encounter (Signed)
Scheduled and letter sent to patient.

## 2019-01-16 ENCOUNTER — Telehealth: Payer: Self-pay | Admitting: Gastroenterology

## 2019-01-16 ENCOUNTER — Ambulatory Visit: Payer: Medicare Other | Admitting: Gastroenterology

## 2019-01-16 NOTE — Telephone Encounter (Signed)
PT STILL HAVING TROUBLE WITH SWALLOWING AND WEAKNESS. TRYING TO GET HIM DRINK NUTRITIONAL SUPPLEMENTS. WAS A CRACK HEAD IN PAST 5 YRS.  WOULD LIKE TO [place]. NOT INTERESTED IN A FEEDING TUBE. GOOD GRASP ON FATHER'S CONDITION. FATHER NOT INTERESTED IN COMING TO MD APPT.  DISCUSSED IT WOULD LIKELY BE BEST TO FOCUS ON QUALITY OF LIFE AND WHAT MAKES HIM CONTENT/HAPPY. SHE CAN CALL ME ANYTIME WITH CONCERNS. CANCEL ALL FUTURE APPTS.

## 2019-02-05 IMAGING — CT CT CERVICAL SPINE W/O CM
4 of 7 series · 14 of 33 positions shown, 15 images · non-contrast
Comparison: Head CT and brain MRI 08/11/2015. Cervical spine CT
09/11/2005

CLINICAL DATA: Post unwitnessed fall.  Found on bathroom floor.

EXAM:
CT HEAD WITHOUT CONTRAST
CT CERVICAL SPINE WITHOUT CONTRAST
TECHNIQUE: Multidetector CT imaging of the head and cervical spine was
performed following the standard protocol without intravenous
contrast. Multiplanar CT image reconstructions of the cervical spine
were also generated.

[Series 4: head bone · axial · 0.45mm/px · z∈[+77,+107]mm · 2 of 75 slices shown]
[im 15/75  bone]
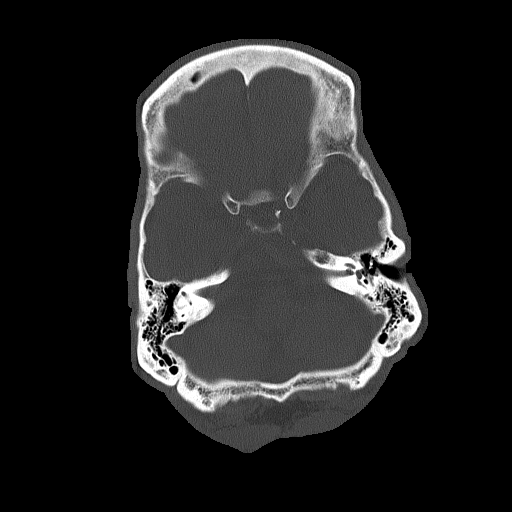
[im 30/75  bone]
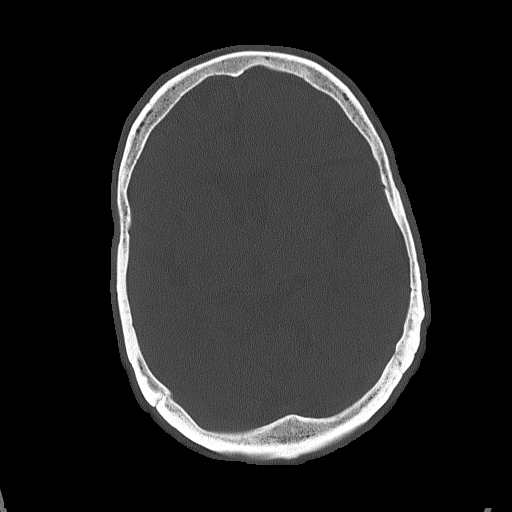

[Series 5: coronal soft tissue · coronal · 0.29mm/px · 3 of 70 slices shown]
[im 18/70  bone]
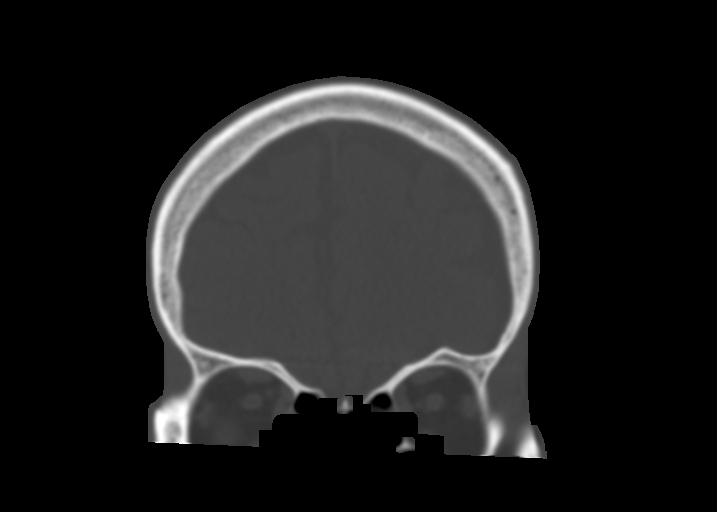
[im 35/70  bone]
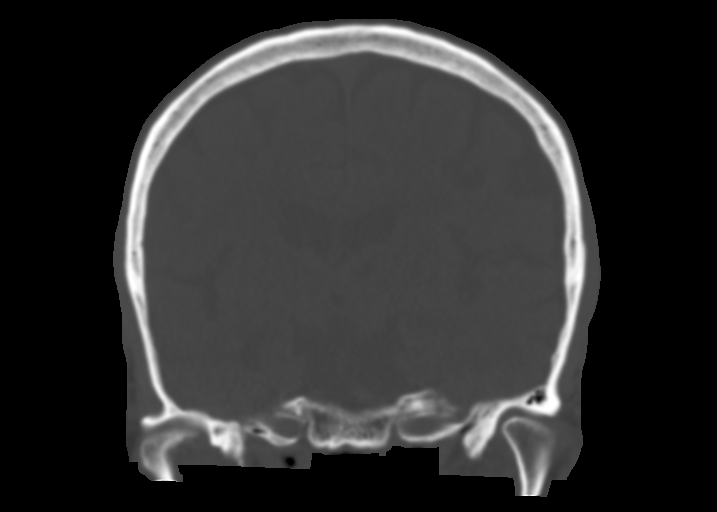
[im 52/70  bone]
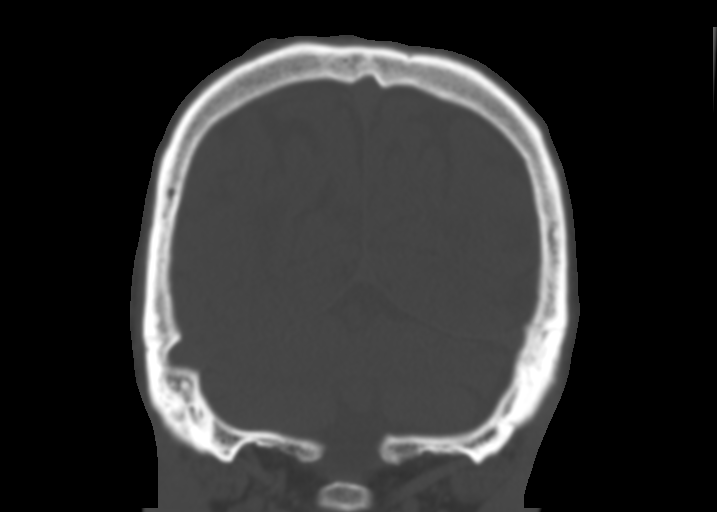

[Series 8: c spine soft · axial · 0.37mm/px · z∈[-66,+16]mm · 4 of 69 slices shown, 5 images]
[im 14/69  soft-tissue]
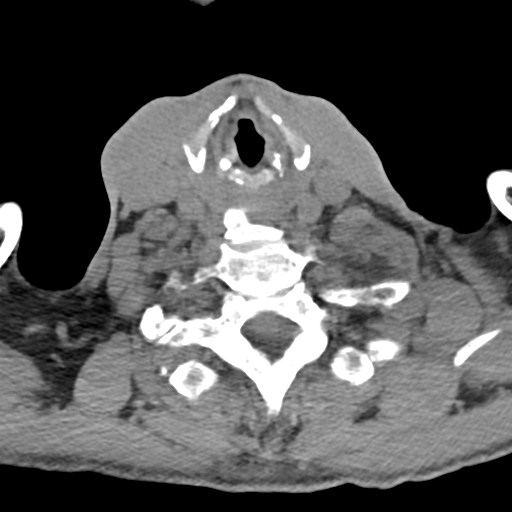
[im 14/69  bone]
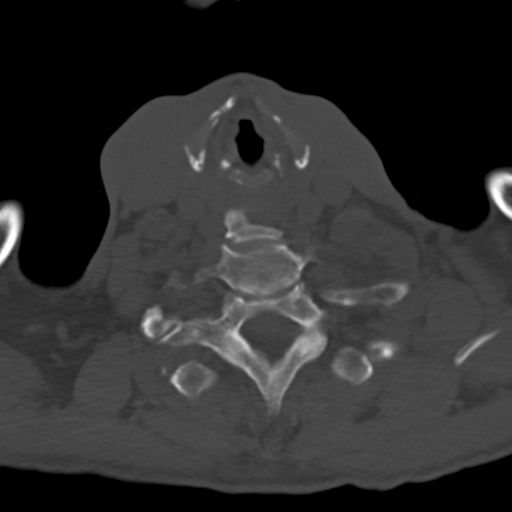
[im 28/69  bone]
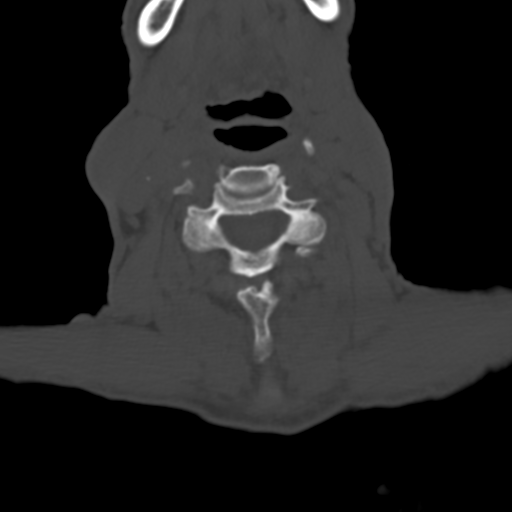
[im 41/69  bone]
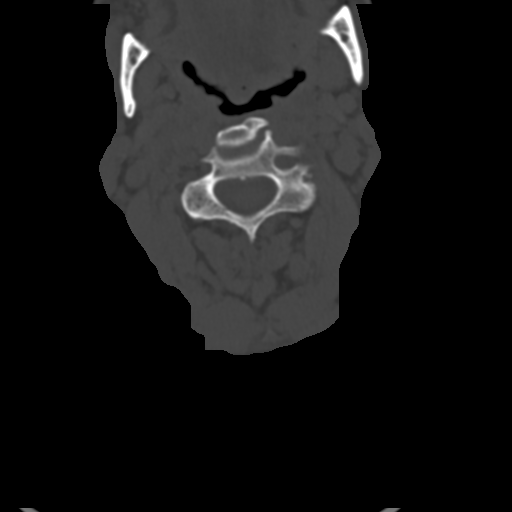
[im 55/69  bone]
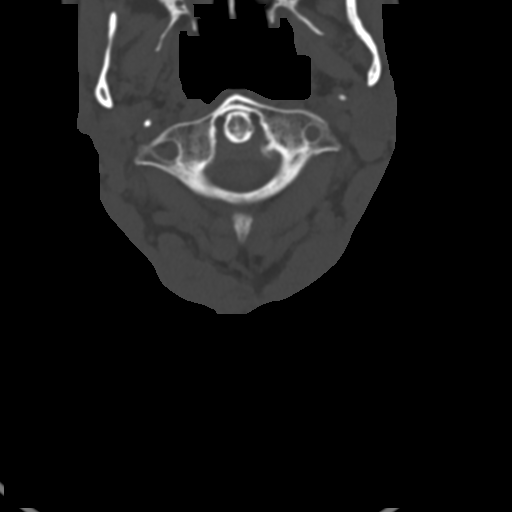

[Series 9: sagittal bone · sagittal · 0.23mm/px · 5 of 61 slices shown]
[im 11/61  bone]
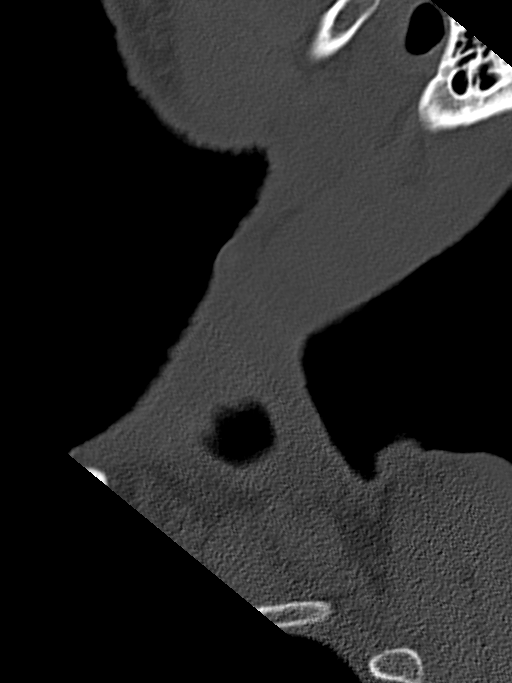
[im 21/61  bone]
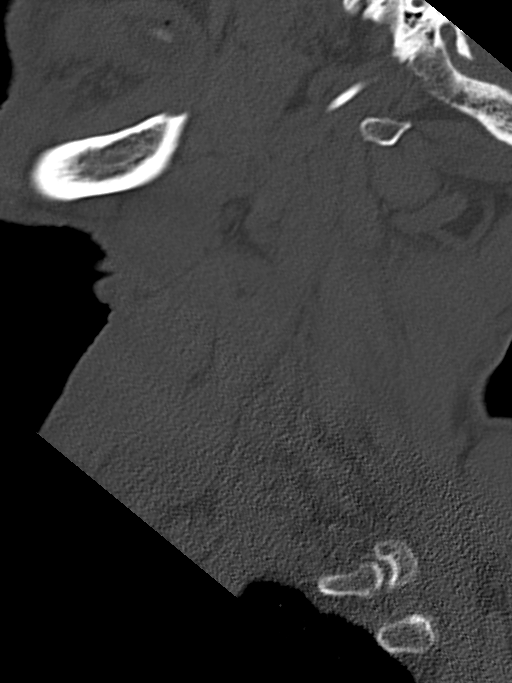
[im 31/61  bone]
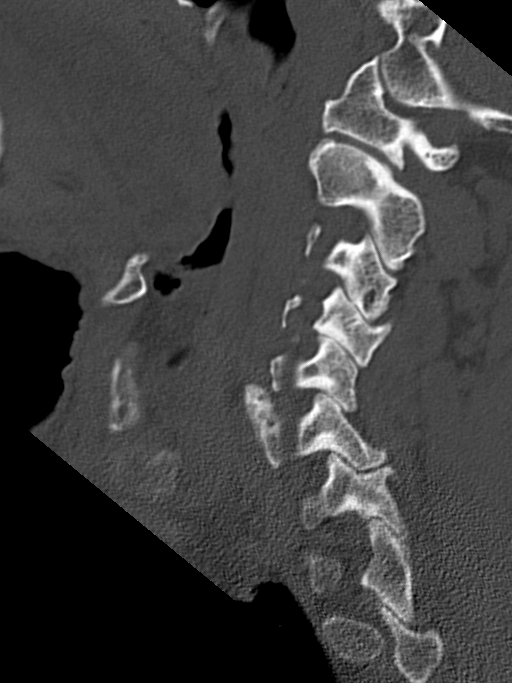
[im 41/61  bone]
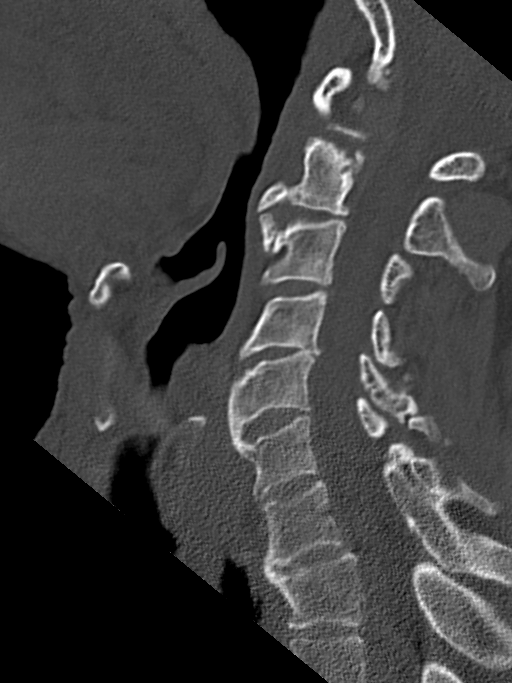
[im 51/61  bone]
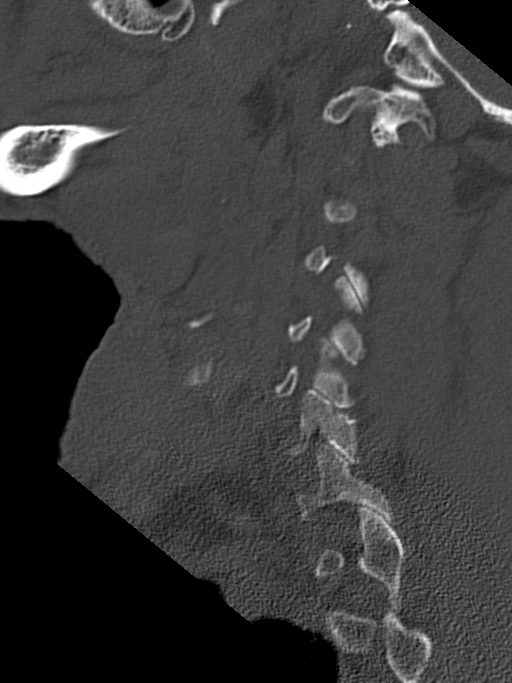

[14 of 33 positions shown; findings below may reference images not displayed]

FINDINGS: CT HEAD FINDINGS

Brain: Generalized atrophy is unchanged from prior exam. Remote
lacunar infarcts in bilateral basal ganglia. Remote infarct in
posterior left frontal lobe. Moderate chronic small vessel ischemic
change. No intracranial hemorrhage, mass effect, or midline shift.
No hydrocephalus. The basilar cisterns are patent. No evidence of
territorial infarct or acute ischemia. No extra-axial or
intracranial fluid collection.

Vascular: No hyperdense vessel or unexpected calcification.

Skull: No fracture or focal lesion.

Sinuses/Orbits: Chronic paranasal sinus opacification of the ethmoid
air cells. Persistent mucosal thickening of the left sphenoid sinus
with improved aeration from prior. No acute finding.

Other: None.

CT CERVICAL SPINE FINDINGS

Alignment: Trace anterolisthesis of C7 on T1. Alignment is otherwise
maintained.

Skull base and vertebrae: Acute type 2 dens fracture at the base of
the odontoid. Fracture is displaced by 4 mm anteriorly, oriented
transversely. Fracture extends from the anterior through the
posterior cortex. Remote fractures of C5 and C6 lamina with
corticated margins, seen on prior cervical spine CT. Skull base is
intact. Vertebral body heights are normal.

Soft tissues and spinal canal: No spinal canal hemorrhage. Minimal
prevertebral soft tissue edema at C2 related to fracture.

Disc levels: Disc space narrowing at C3-C4 and C4-C5. Multilevel
endplate spurring throughout cervical spine.

Upper chest: Advanced emphysema at the apices. The upper esophagus
is patulous with thick wall.

Other: Carotid calcifications.
IMPRESSION: 1.  No acute intracranial abnormality.  No skull fracture.
2. Unchanged atrophy, chronic small vessel ischemia and remote
infarcts.
3. Acute type 2 dens fracture with 4 mm displacement.
4. Minimal anterolisthesis of C7 on T1 is likely degenerative,
however nonspecific.
5. Remote C5 and C6 lamina fractures. Multilevel degenerative change
throughout cervical spine.
6. Advanced emphysema noted at the lung apices.

Critical Value/emergent results were called by telephone at the time
of interpretation on 10/10/2017 at [DATE] to Dr. EIMY NATARAJAN , who
verbally acknowledged these results.

## 2019-02-05 IMAGING — DX DG PELVIS 1-2V
1 series · 1 of 1 positions shown · non-contrast
Comparison: Prior radiograph from 09/11/2005.

CLINICAL DATA: Initial evaluation for acute trauma, found down.

EXAM:
PELVIS - 1-2 VIEW

[pelvis ap]
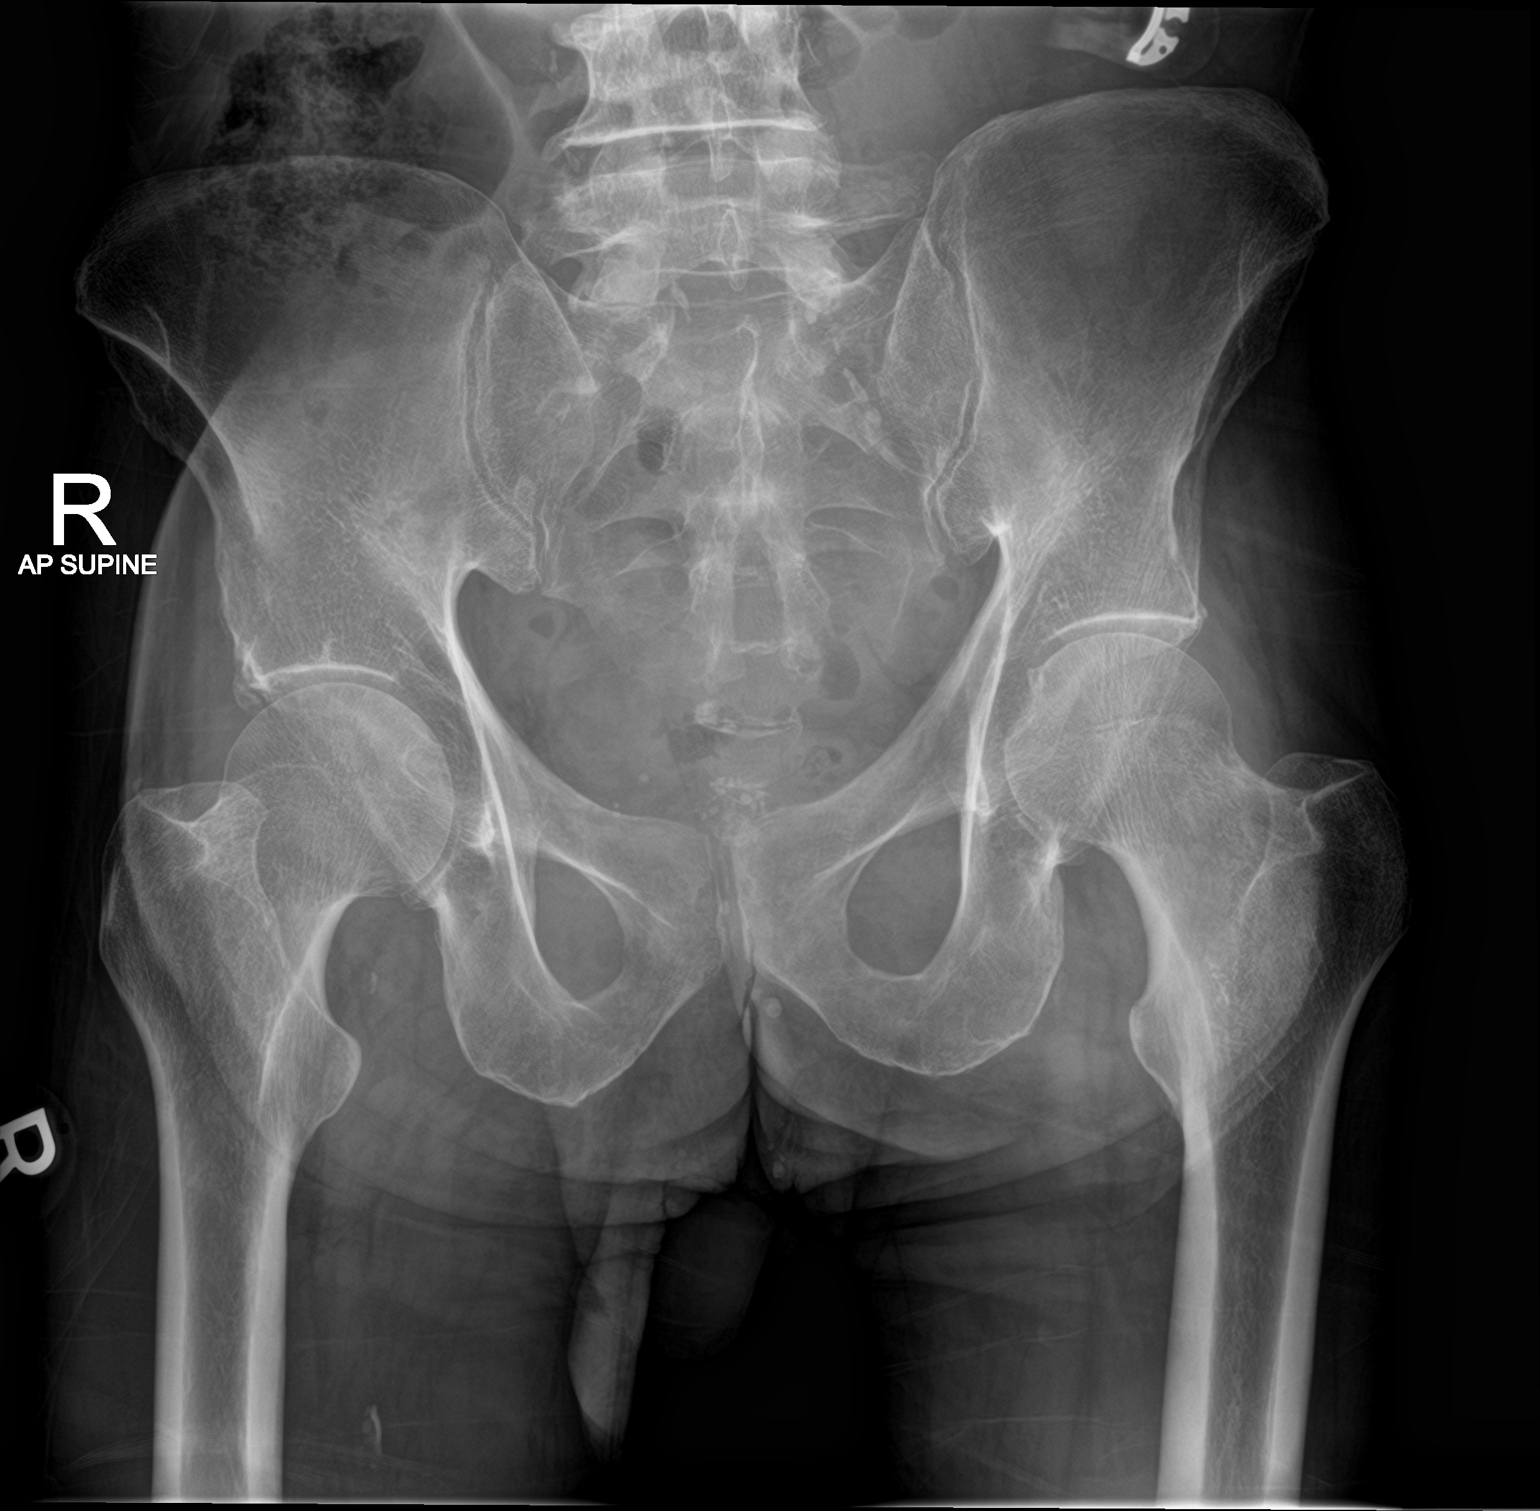

[1 of 1 positions shown; findings below may reference images not displayed]

FINDINGS: No acute fracture or dislocation. Femoral heads in normal alignment
within the acetabula. Femoral head heights maintained. Bony pelvis
intact. SI joints approximated. No acute soft tissue abnormality.

Prostatic calcifications noted. Few scattered vascular
calcifications noted as well.
IMPRESSION: No acute osseous abnormality about the pelvis.

## 2019-04-13 ENCOUNTER — Emergency Department (HOSPITAL_COMMUNITY): Payer: Medicare Other

## 2019-04-13 ENCOUNTER — Encounter (HOSPITAL_COMMUNITY): Payer: Self-pay | Admitting: *Deleted

## 2019-04-13 ENCOUNTER — Other Ambulatory Visit: Payer: Self-pay

## 2019-04-13 ENCOUNTER — Emergency Department (HOSPITAL_COMMUNITY)
Admission: EM | Admit: 2019-04-13 | Discharge: 2019-04-13 | Disposition: A | Discharge status: Home / Self Care | Payer: Medicare Other | Attending: Emergency Medicine | Admitting: Emergency Medicine

## 2019-04-13 DIAGNOSIS — R062 Wheezing: Secondary | ICD-10-CM

## 2019-04-13 DIAGNOSIS — Z7982 Long term (current) use of aspirin: Secondary | ICD-10-CM | POA: Insufficient documentation

## 2019-04-13 DIAGNOSIS — R05 Cough: Secondary | ICD-10-CM | POA: Diagnosis present

## 2019-04-13 DIAGNOSIS — Z20828 Contact with and (suspected) exposure to other viral communicable diseases: Secondary | ICD-10-CM | POA: Diagnosis not present

## 2019-04-13 DIAGNOSIS — F17210 Nicotine dependence, cigarettes, uncomplicated: Secondary | ICD-10-CM | POA: Insufficient documentation

## 2019-04-13 DIAGNOSIS — Z79899 Other long term (current) drug therapy: Secondary | ICD-10-CM | POA: Diagnosis not present

## 2019-04-13 DIAGNOSIS — J449 Chronic obstructive pulmonary disease, unspecified: Secondary | ICD-10-CM | POA: Insufficient documentation

## 2019-04-13 LAB — COMPREHENSIVE METABOLIC PANEL WITH GFR
ALT: 14 U/L (ref 0–44)
AST: 14 U/L — ABNORMAL LOW (ref 15–41)
Albumin: 3.7 g/dL (ref 3.5–5.0)
Alkaline Phosphatase: 61 U/L (ref 38–126)
Anion gap: 8 (ref 5–15)
BUN: 14 mg/dL (ref 8–23)
CO2: 26 mmol/L (ref 22–32)
Calcium: 8.9 mg/dL (ref 8.9–10.3)
Chloride: 107 mmol/L (ref 98–111)
Creatinine, Ser: 1.19 mg/dL (ref 0.61–1.24)
GFR calc Af Amer: >60 mL/min
GFR calc non Af Amer: >60 mL/min
Glucose, Bld: 109 mg/dL — ABNORMAL HIGH (ref 70–99)
Potassium: 4.1 mmol/L (ref 3.5–5.1)
Sodium: 141 mmol/L (ref 135–145)
Total Bilirubin: 0.4 mg/dL (ref 0.3–1.2)
Total Protein: 7.2 g/dL (ref 6.5–8.1)

## 2019-04-13 LAB — URINALYSIS, ROUTINE W REFLEX MICROSCOPIC
Bilirubin Urine: NEGATIVE
Glucose, UA: NEGATIVE mg/dL
Hgb urine dipstick: NEGATIVE
Ketones, ur: NEGATIVE mg/dL
Leukocytes,Ua: NEGATIVE
Nitrite: NEGATIVE
Protein, ur: NEGATIVE mg/dL
Specific Gravity, Urine: 1.01 (ref 1.005–1.030)
pH: 6 (ref 5.0–8.0)

## 2019-04-13 LAB — CBC WITH DIFFERENTIAL/PLATELET
Abs Immature Granulocytes: 0.01 K/uL (ref 0.00–0.07)
Basophils Absolute: 0 K/uL (ref 0.0–0.1)
Basophils Relative: 1 %
Eosinophils Absolute: 0.1 K/uL (ref 0.0–0.5)
Eosinophils Relative: 1 %
HCT: 44.6 % (ref 39.0–52.0)
Hemoglobin: 14.3 g/dL (ref 13.0–17.0)
Immature Granulocytes: 0 %
Lymphocytes Relative: 8 %
Lymphs Abs: 0.4 K/uL — ABNORMAL LOW (ref 0.7–4.0)
MCH: 27.8 pg (ref 26.0–34.0)
MCHC: 32.1 g/dL (ref 30.0–36.0)
MCV: 86.8 fL (ref 80.0–100.0)
Monocytes Absolute: 0.1 K/uL (ref 0.1–1.0)
Monocytes Relative: 2 %
Neutro Abs: 4.6 K/uL (ref 1.7–7.7)
Neutrophils Relative %: 88 %
Platelets: 235 K/uL (ref 150–400)
RBC: 5.14 MIL/uL (ref 4.22–5.81)
RDW: 14.5 % (ref 11.5–15.5)
WBC: 5.1 K/uL (ref 4.0–10.5)
nRBC: 0 % (ref 0.0–0.2)

## 2019-04-13 LAB — SARS CORONAVIRUS 2 BY RT PCR (HOSPITAL ORDER, PERFORMED IN ~~LOC~~ HOSPITAL LAB): SARS Coronavirus 2: NEGATIVE

## 2019-04-13 MED ORDER — ALBUTEROL SULFATE (2.5 MG/3ML) 0.083% IN NEBU
2.5000 mg | INHALATION_SOLUTION | Freq: Once | RESPIRATORY_TRACT | Status: AC
  Administered 2019-04-13: 2.5 mg via RESPIRATORY_TRACT
  Filled 2019-04-13: qty 3

## 2019-04-13 MED ORDER — IPRATROPIUM-ALBUTEROL 0.5-2.5 (3) MG/3ML IN SOLN
3.0000 mL | Freq: Once | RESPIRATORY_TRACT | Status: AC
  Administered 2019-04-13: 3 mL via RESPIRATORY_TRACT
  Filled 2019-04-13: qty 3

## 2019-04-13 MED ORDER — MAGNESIUM SULFATE 2 GM/50ML IV SOLN
2.0000 g | Freq: Once | INTRAVENOUS | Status: AC
  Administered 2019-04-13: 2 g via INTRAVENOUS
  Filled 2019-04-13: qty 50

## 2019-04-13 MED ORDER — PREDNISONE 20 MG PO TABS: ORAL_TABLET | ORAL | 0 refills | Status: DC

## 2019-04-13 MED ORDER — DOXYCYCLINE HYCLATE 100 MG PO CAPS: 100.0000 mg | ORAL_CAPSULE | Freq: Two times a day (BID) | ORAL | 0 refills | Status: DC

## 2019-04-13 NOTE — ED Notes (Signed)
Pt daughter reports pt is on a thickened diet at home and that he has been urinating on himself and seems a little confused for the last 2 weeks and she is concerned about a UTI.

## 2019-04-13 NOTE — ED Provider Notes (Signed)
[facility] EMERGENCY DEPARTMENT Provider Note   CSN: [id number] Arrival date & time: 04/13/19  1049     History   Chief Complaint Chief Complaint  Patient presents with  . Wheezing    HPI Jose Schaefer is a 68 y.o. male.     Patient complains of cough and shortness of breath.  Patient has a history of COPD and does not use his oxygen at home according to his daughter  The history is provided by the patient. No language interpreter was used.  Wheezing Severity:  Moderate Severity compared to prior episodes:  Similar Onset quality:  Sudden Timing:  Constant Progression:  Worsening Chronicity:  Recurrent Context: not animal exposure   Relieved by:  Nothing Associated symptoms: no chest pain, no cough, no fatigue, no headaches and no rash     Past Medical History:  Diagnosis Date  . Asthma   . COPD (chronic obstructive pulmonary disease) (HCC)   . CVA (cerebral vascular accident) [facility]) 2001   [place]  . Tobacco abuse     Patient Active Problem List   Diagnosis Date Noted  . Aspiration pneumonia of right lung (HCC)   . Esophagitis   . Abnormal CT scan, esophagus   . Malnutrition of moderate degree 10/10/2018  . COPD exacerbation (HCC) 10/09/2018  . Dysphagia 10/09/2018  . Aspiration into airway 10/09/2018  . Lactic acidosis 10/09/2018  . Cerebral infarction due to unspecified mechanism   . CVA (cerebral infarction) 08/11/2015  . Cocaine abuse (HCC) 08/11/2015  . ARF (acute renal failure) (HCC) 08/11/2015  . Acute respiratory failure (HCC) 06/26/2011  . COPD (chronic obstructive pulmonary disease) with acute bronchitis (HCC) 06/26/2011  . Tobacco abuse   . Asthma     Past Surgical History:  Procedure Laterality Date  . ESOPHAGEAL DILATION N/A 10/13/2018   Procedure: ESOPHAGEAL DILATION;  Surgeon: West Bali, MD;  Location: [facility];  Service: Endoscopy;  Laterality: N/A;  . ESOPHAGOGASTRODUODENOSCOPY N/A 10/13/2018   Procedure:  ESOPHAGOGASTRODUODENOSCOPY (EGD);  Surgeon: West Bali, MD;  Location: [facility] ENDO SUITE;  Service: Endoscopy;  Laterality: N/A;        Home Medications    Prior to Admission medications   Medication Sig Start Date End Date Taking? Authorizing Provider  albuterol (PROVENTIL HFA;VENTOLIN HFA) 108 (90 Base) MCG/ACT inhaler Inhale 2 puffs into the lungs every 2 (two) hours as needed for wheezing or shortness of breath (cough). 01/04/17  Yes Pollina, Canary Brim, MD  albuterol (PROVENTIL) (2.5 MG/3ML) 0.083% nebulizer solution Take 3 mLs (2.5 mg total) by nebulization every 6 (six) hours as needed for wheezing or shortness of breath. Patient will need a nebulizer machine. 10/13/18  Yes Johnson, Clanford L, MD  aspirin 81 MG EC tablet Take 81 mg by mouth daily. Swallow whole.   Yes [provider]  doxycycline (VIBRAMYCIN) 100 MG capsule Take 1 capsule (100 mg total) by mouth 2 (two) times daily. One po bid x 7 days 04/13/19   Bethann Berkshire, MD  Maltodextrin-Xanthan Gum (RESOURCE [facility] CLEAR) POWD Use as directed to thicken liquids to nectar consistency Patient not taking: Reported on 04/13/2019 10/13/18   Cleora Fleet, MD  pantoprazole (PROTONIX) 40 MG tablet Take 1 tablet (40 mg total) by mouth daily before breakfast for 30 days. Patient not taking: Reported on 04/13/2019 10/14/18 11/13/18  Cleora Fleet, MD  predniSONE (DELTASONE) 20 MG tablet 2 tabs po daily x 3 days 04/13/19   Bethann Berkshire, MD    Family  History No family history on file.  Social History Social History   Tobacco Use  . Smoking status: Current Every Day Smoker    Packs/day: 0.50    Years: 40.00    Pack years: 20.00    Types: Cigarettes  . Smokeless tobacco: Never Used  Substance Use Topics  . Alcohol use: Not Currently    Alcohol/week: 14.0 standard drinks    Types: 4 Cans of beer, 10 Shots of liquor per week    Comment: as much as i can get, not current per daughter  . Drug use: Not  Currently    Types: Cocaine, Marijuana     Allergies   Patient has no known allergies.   Review of Systems Review of Systems  Constitutional: Negative for appetite change and fatigue.  HENT: Negative for congestion, ear discharge and sinus pressure.   Eyes: Negative for discharge.  Respiratory: Positive for wheezing. Negative for cough.   Cardiovascular: Negative for chest pain.  Gastrointestinal: Negative for abdominal pain and diarrhea.  Genitourinary: Negative for frequency and hematuria.  Musculoskeletal: Negative for back pain.  Skin: Negative for rash.  Neurological: Negative for seizures and headaches.  Psychiatric/Behavioral: Negative for hallucinations.     Physical Exam Updated Vital Signs BP (!) 141/94   Pulse 68   Resp 17   Ht 5\' 8"  (1.727 m)   Wt 61.2 kg   SpO2 100%   BMI 20.53 kg/m   Physical Exam Vitals signs and nursing note reviewed.  Constitutional:      Appearance: He is well-developed.  HENT:     Head: Normocephalic.     Nose: Nose normal.  Eyes:     General: No scleral icterus.    Conjunctiva/sclera: Conjunctivae normal.  Neck:     Musculoskeletal: Neck supple.     Thyroid: No thyromegaly.  Cardiovascular:     Rate and Rhythm: Normal rate and regular rhythm.     Heart sounds: No murmur. No friction rub. No gallop.   Pulmonary:     Breath sounds: No stridor. Wheezing present. No rales.  Chest:     Chest wall: No tenderness.  Abdominal:     General: There is no distension.     Tenderness: There is no abdominal tenderness. There is no rebound.  Musculoskeletal: Normal range of motion.  Lymphadenopathy:     Cervical: No cervical adenopathy.  Skin:    Findings: No erythema or rash.  Neurological:     Mental Status: He is oriented to person, place, and time.     Motor: No abnormal muscle tone.     Coordination: Coordination normal.  Psychiatric:        Behavior: Behavior normal.      ED Treatments / Results  Labs (all labs  ordered are listed, but only abnormal results are displayed) Labs Reviewed  CBC WITH DIFFERENTIAL/PLATELET - Abnormal; Notable for the following components:      Result Value   Lymphs Abs 0.4 (*)    All other components within normal limits  COMPREHENSIVE METABOLIC PANEL - Abnormal; Notable for the following components:   Glucose, Bld 109 (*)    AST 14 (*)    All other components within normal limits  SARS CORONAVIRUS 2 (HOSPITAL ORDER, [place] [facility])  URINALYSIS, ROUTINE W REFLEX MICROSCOPIC    EKG None  Radiology Dg Chest Portable 1 View  Result Date: 04/13/2019 CLINICAL DATA:  Shortness of breath, malodorous urine EXAM: PORTABLE CHEST 1 VIEW COMPARISON:  10/09/2018 FINDINGS: The heart size and mediastinal contours are within normal limits. Mild, diffuse interstitial pulmonary opacity. The visualized skeletal structures are unremarkable. IMPRESSION: Mild diffuse interstitial pulmonary opacity, which may reflect mild edema or infection. No focal airspace opacity. Electronically Signed   By: Lauralyn PrimesAlex  Bibbey M.D.   On: 04/13/2019 12:34    Procedures Procedures (including critical care time)  Medications Ordered in ED Medications  ipratropium-albuterol (DUONEB) 0.5-2.5 (3) MG/3ML nebulizer solution 3 mL (has no administration in time range)  albuterol (PROVENTIL) (2.5 MG/3ML) 0.083% nebulizer solution 2.5 mg (has no administration in time range)  magnesium sulfate IVPB 2 g 50 mL (0 g Intravenous Stopped 04/13/19 1319)     Initial Impression / Assessment and Plan / ED Course  I have reviewed the triage vital signs and the nursing notes.  Pertinent labs & imaging results that were available during my care of the patient were reviewed by me and considered in my medical decision making (see chart for details).       Patient with exacerbation of COPD and possible respiratory infection.  Patient placed on doxycycline and prednisone and told to wear his oxygen at  home  Final Clinical Impressions(s) / ED Diagnoses   Final diagnoses:  Wheezing    ED Discharge Orders         Ordered    doxycycline (VIBRAMYCIN) 100 MG capsule  2 times daily     04/13/19 1423    predniSONE (DELTASONE) 20 MG tablet     04/13/19 1423           Bethann BerkshireZammit, Izmael Duross, MD 04/13/19 1429

## 2019-04-13 NOTE — ED Notes (Signed)
Awaiting respiratory treatments for discharge.

## 2019-04-13 NOTE — Discharge Instructions (Addendum)
Follow up with your md this week.  Ware your oxygen at home

## 2019-04-13 NOTE — ED Triage Notes (Signed)
Pt arrives via EMS from home with complaint of worsening wheezing and a cough over the last few days. Has been using albuterol more than prescribed without relief. Used albuterol x 3 prior to EMS arrival and was audibly wheezing when they arrived. They administer a duoneb and solumedrol 125mg  and sats were 98 %. Pt does not wear prescribed O2 at home but was on 3 liters upon arrival to ED.

## 2019-04-13 NOTE — ED Notes (Signed)
Pt's family called stating they were concerned about pt's urine as he has recently been incontinent of urine with a very bad smell.

## 2019-06-07 ENCOUNTER — Other Ambulatory Visit: Payer: Self-pay

## 2019-06-07 ENCOUNTER — Encounter (HOSPITAL_COMMUNITY): Payer: Self-pay

## 2019-06-07 ENCOUNTER — Emergency Department (HOSPITAL_COMMUNITY): Payer: Medicare Other

## 2019-06-07 ENCOUNTER — Emergency Department (HOSPITAL_COMMUNITY)
Admission: EM | Admit: 2019-06-07 | Discharge: 2019-06-08 | Disposition: A | Discharge status: Home / Self Care | Payer: Medicare Other | Attending: Emergency Medicine | Admitting: Emergency Medicine

## 2019-06-07 DIAGNOSIS — T17320A Food in larynx causing asphyxiation, initial encounter: Secondary | ICD-10-CM

## 2019-06-07 DIAGNOSIS — Y999 Unspecified external cause status: Secondary | ICD-10-CM | POA: Insufficient documentation

## 2019-06-07 DIAGNOSIS — R05 Cough: Secondary | ICD-10-CM | POA: Diagnosis not present

## 2019-06-07 DIAGNOSIS — R059 Cough, unspecified: Secondary | ICD-10-CM

## 2019-06-07 DIAGNOSIS — J449 Chronic obstructive pulmonary disease, unspecified: Secondary | ICD-10-CM | POA: Diagnosis not present

## 2019-06-07 DIAGNOSIS — Y939 Activity, unspecified: Secondary | ICD-10-CM | POA: Diagnosis not present

## 2019-06-07 DIAGNOSIS — X58XXXA Exposure to other specified factors, initial encounter: Secondary | ICD-10-CM | POA: Insufficient documentation

## 2019-06-07 DIAGNOSIS — J45909 Unspecified asthma, uncomplicated: Secondary | ICD-10-CM | POA: Insufficient documentation

## 2019-06-07 DIAGNOSIS — Y929 Unspecified place or not applicable: Secondary | ICD-10-CM | POA: Insufficient documentation

## 2019-06-07 DIAGNOSIS — T18128A Food in esophagus causing other injury, initial encounter: Secondary | ICD-10-CM | POA: Insufficient documentation

## 2019-06-07 DIAGNOSIS — F17210 Nicotine dependence, cigarettes, uncomplicated: Secondary | ICD-10-CM | POA: Insufficient documentation

## 2019-06-07 MED ORDER — PREDNISONE 20 MG PO TABS: ORAL_TABLET | ORAL | 0 refills | Status: DC

## 2019-06-07 MED ORDER — ALBUTEROL SULFATE HFA 108 (90 BASE) MCG/ACT IN AERS
4.0000 | INHALATION_SPRAY | Freq: Once | RESPIRATORY_TRACT | Status: AC
  Administered 2019-06-07: 4 via RESPIRATORY_TRACT
  Filled 2019-06-07: qty 6.7

## 2019-06-07 NOTE — ED Notes (Signed)
Patient transported to X-ray 

## 2019-06-07 NOTE — ED Triage Notes (Signed)
Pt in by [facility] after pt choked on some food at home.  Pt was able to clear it himself and per ems has done well en route, vss

## 2019-06-08 NOTE — ED Provider Notes (Signed)
Emergency Department Provider Note   I have reviewed the triage vital signs and the nursing notes.   HISTORY  Chief Complaint Choking Episode   HPI Jose Schaefer is a 68 y.o. male who presents the emergency department for reported choking episode.  Per nursing staff he has been in the hallway for multiple hours without 1 episode of choking prior to my arrival.  He does cough and have some coughing fits and has some audible wheezing.  He does not remember choking earlier today.  No fevers.  No reported productive cough.  Eating and drinking okay here without any regurgitation.   No other associated or modifying symptoms.    Past Medical History:  Diagnosis Date  . Asthma   . COPD (chronic obstructive pulmonary disease) (HCC)   . CVA (cerebral vascular accident) [facility](HCC) 2001   [place]  . Tobacco abuse     Patient Active Problem List   Diagnosis Date Noted  . Aspiration pneumonia of right lung (HCC)   . Esophagitis   . Abnormal CT scan, esophagus   . Malnutrition of moderate degree 10/10/2018  . COPD exacerbation (HCC) 10/09/2018  . Dysphagia 10/09/2018  . Aspiration into airway 10/09/2018  . Lactic acidosis 10/09/2018  . Cerebral infarction due to unspecified mechanism   . CVA (cerebral infarction) 08/11/2015  . Cocaine abuse (HCC) 08/11/2015  . ARF (acute renal failure) (HCC) 08/11/2015  . Acute respiratory failure (HCC) 06/26/2011  . COPD (chronic obstructive pulmonary disease) with acute bronchitis (HCC) 06/26/2011  . Tobacco abuse   . Asthma     Past Surgical History:  Procedure Laterality Date  . ESOPHAGEAL DILATION N/A 10/13/2018   Procedure: ESOPHAGEAL DILATION;  Surgeon: West BaliFields, Sandi L, MD;  Location: [facility];  Service: Endoscopy;  Laterality: N/A;  . ESOPHAGOGASTRODUODENOSCOPY N/A 10/13/2018   Procedure: ESOPHAGOGASTRODUODENOSCOPY (EGD);  Surgeon: West BaliFields, Sandi L, MD;  Location: [facility];  Service: Endoscopy;  Laterality: N/A;    Current  Outpatient Rx  . Order #: [id number] Class: Print  . Order #: [id number] Class: Normal  . Order #: [id number] Class: Historical Med  . Order #: [id number] Class: Print  . Order #: [id number] Class: Normal  . Order #: [id number] Class: Normal  . Order #: [id number] Class: Normal    Allergies Patient has no known allergies.  No family history on file.  Social History Social History   Tobacco Use  . Smoking status: Current Every Day Smoker    Packs/day: 0.50    Years: 40.00    Pack years: 20.00    Types: Cigarettes  . Smokeless tobacco: Never Used  Substance Use Topics  . Alcohol use: Not Currently    Alcohol/week: 14.0 standard drinks    Types: 4 Cans of beer, 10 Shots of liquor per week    Comment: as much as i can get, not current per daughter  . Drug use: Not Currently    Types: Cocaine, Marijuana    Review of Systems  All other systems negative except as documented in the HPI. All pertinent positives and negatives as reviewed in the HPI. ____________________________________________   PHYSICAL EXAM:  VITAL SIGNS: ED Triage Vitals  Enc Vitals Group     BP 06/07/19 1929 107/85     Pulse Rate 06/07/19 1929 85     Resp 06/07/19 1929 (!) 21     Temp --      Temp Source 06/07/19 1929 Oral     SpO2 06/07/19 1929 100 %     Weight 06/07/19 1930  136 lb 11 oz (62 kg)     Height --      Head Circumference --      Peak Flow --      Pain Score 06/07/19 1929 0     Pain Loc --      Pain Edu? --      Excl. in GC? --     Constitutional: Alert and oriented. Well appearing and in no acute distress. Eyes: Conjunctivae are normal. PERRL. EOMI. Head: Atraumatic. Nose: No congestion/rhinnorhea. Mouth/Throat: Mucous membranes are moist.  Oropharynx non-erythematous. Neck: No stridor.  No meningeal signs.   Cardiovascular: Normal rate, regular rhythm. Good peripheral circulation. Grossly normal heart sounds.   Respiratory: Normal respiratory effort.  No retractions. Lungs with audible  wheezing.  He is not tachypneic or hypoxic even on ambulation.. Gastrointestinal: Soft and nontender. No distention.  Musculoskeletal: No lower extremity tenderness nor edema. No gross deformities of extremities. Neurologic:  Normal speech and language. No gross focal neurologic deficits are appreciated.  Skin:  Skin is warm, dry and intact. No rash noted.   ____________________________________________   LABS (all labs ordered are listed, but only abnormal results are displayed)  Labs Reviewed - No data to display ____________________________________________  EKG   EKG Interpretation  Date/Time:  Friday June 07 2019 23:29:58 EST Ventricular Rate:  83 PR Interval:  170 QRS Duration: 76 QT Interval:  376 QTC Calculation: 441 R Axis:   25 Text Interpretation: Sinus rhythm with occasional Premature ventricular complexes Otherwise normal ECG no obvious change from  september Confirmed by Marily Memos [phone]) on 06/08/2019 12:38:34 AM       ____________________________________________  RADIOLOGY  Dg Chest 2 View  Result Date: 06/07/2019 CLINICAL DATA:  Wheezing, shortness of breath. EXAM: CHEST - 2 VIEW COMPARISON:  04/13/2019 FINDINGS: There is hyperinflation of the lungs compatible with COPD. Heart and mediastinal contours are within normal limits. No focal opacities or effusions. No acute bony abnormality. IMPRESSION: COPD.  No active cardiopulmonary disease. Electronically Signed   By: Charlett Nose M.D.   On: 06/07/2019 21:03    ____________________________________________   INITIAL IMPRESSION / ASSESSMENT AND PLAN / ED COURSE  Workup negative. Suspect it is from coughing rather than choking. Either way, no vomiting here.  Tolerating p.o. without difficulty. Inhaler helped cough and wheezing some, will dc on same.      Pertinent labs & imaging results that were available during my care of the patient were reviewed by me and considered in my medical decision making  (see chart for details).   A medical screening exam was performed and I feel the patient has had an appropriate workup for their chief complaint at this time and likelihood of emergent condition existing is low. They have been counseled on decision, discharge, follow up and which symptoms necessitate immediate return to the emergency department. They or their family verbally stated understanding and agreement with plan and discharged in stable condition.   ____________________________________________  FINAL CLINICAL IMPRESSION(S) / ED DIAGNOSES  Final diagnoses:  Choking due to food (regurgitated)  Cough     MEDICATIONS GIVEN DURING THIS VISIT:  Medications  albuterol (VENTOLIN HFA) 108 (90 Base) MCG/ACT inhaler 4 puff (4 puffs Inhalation Given 06/07/19 2325)     NEW OUTPATIENT MEDICATIONS STARTED DURING THIS VISIT:  New Prescriptions   PREDNISONE (DELTASONE) 20 MG TABLET    2 tabs po daily x 5 days    Note:  This note was prepared with assistance of Dragon voice  recognition software. Occasional wrong-word or sound-a-like substitutions may have occurred due to the inherent limitations of voice recognition software.   Arriel Victor, Corene Cornea, MD 06/08/19 [phone]

## 2019-08-26 ENCOUNTER — Emergency Department (HOSPITAL_COMMUNITY): Payer: Medicare Other

## 2019-08-26 ENCOUNTER — Inpatient Hospital Stay (HOSPITAL_COMMUNITY)
Admission: EM | Admit: 2019-08-26 | Discharge: 2019-08-27 | DRG: 064 | Disposition: A | Discharge status: Home Health | Payer: Medicare Other | Attending: Family Medicine | Admitting: Family Medicine

## 2019-08-26 ENCOUNTER — Encounter (HOSPITAL_COMMUNITY): Payer: Self-pay | Admitting: Emergency Medicine

## 2019-08-26 DIAGNOSIS — J45909 Unspecified asthma, uncomplicated: Secondary | ICD-10-CM | POA: Diagnosis present

## 2019-08-26 DIAGNOSIS — I6381 Other cerebral infarction due to occlusion or stenosis of small artery: Secondary | ICD-10-CM | POA: Diagnosis not present

## 2019-08-26 DIAGNOSIS — Z7982 Long term (current) use of aspirin: Secondary | ICD-10-CM

## 2019-08-26 DIAGNOSIS — R1310 Dysphagia, unspecified: Secondary | ICD-10-CM

## 2019-08-26 DIAGNOSIS — Z87440 Personal history of urinary (tract) infections: Secondary | ICD-10-CM

## 2019-08-26 DIAGNOSIS — E785 Hyperlipidemia, unspecified: Secondary | ICD-10-CM | POA: Diagnosis present

## 2019-08-26 DIAGNOSIS — I639 Cerebral infarction, unspecified: Secondary | ICD-10-CM | POA: Diagnosis present

## 2019-08-26 DIAGNOSIS — I493 Ventricular premature depolarization: Secondary | ICD-10-CM | POA: Diagnosis present

## 2019-08-26 DIAGNOSIS — Z79899 Other long term (current) drug therapy: Secondary | ICD-10-CM

## 2019-08-26 DIAGNOSIS — Z20822 Contact with and (suspected) exposure to covid-19: Secondary | ICD-10-CM | POA: Diagnosis present

## 2019-08-26 DIAGNOSIS — G9341 Metabolic encephalopathy: Secondary | ICD-10-CM | POA: Diagnosis present

## 2019-08-26 DIAGNOSIS — I69354 Hemiplegia and hemiparesis following cerebral infarction affecting left non-dominant side: Secondary | ICD-10-CM

## 2019-08-26 DIAGNOSIS — F17210 Nicotine dependence, cigarettes, uncomplicated: Secondary | ICD-10-CM | POA: Diagnosis present

## 2019-08-26 DIAGNOSIS — J209 Acute bronchitis, unspecified: Secondary | ICD-10-CM | POA: Diagnosis present

## 2019-08-26 DIAGNOSIS — Z8719 Personal history of other diseases of the digestive system: Secondary | ICD-10-CM

## 2019-08-26 DIAGNOSIS — R4182 Altered mental status, unspecified: Secondary | ICD-10-CM | POA: Diagnosis not present

## 2019-08-26 DIAGNOSIS — F0390 Unspecified dementia without behavioral disturbance: Secondary | ICD-10-CM | POA: Diagnosis present

## 2019-08-26 DIAGNOSIS — J449 Chronic obstructive pulmonary disease, unspecified: Secondary | ICD-10-CM | POA: Diagnosis present

## 2019-08-26 HISTORY — DX: Unspecified dementia, unspecified severity, without behavioral disturbance, psychotic disturbance, mood disturbance, and anxiety: F03.90

## 2019-08-26 LAB — CBC WITH DIFFERENTIAL/PLATELET
Abs Immature Granulocytes: 0.01 10*3/uL (ref 0.00–0.07)
Basophils Absolute: 0.1 10*3/uL (ref 0.0–0.1)
Basophils Relative: 1 %
Eosinophils Absolute: 1.7 10*3/uL — ABNORMAL HIGH (ref 0.0–0.5)
Eosinophils Relative: 27 %
HCT: 43.9 % (ref 39.0–52.0)
Hemoglobin: 14.2 g/dL (ref 13.0–17.0)
Immature Granulocytes: 0 %
Lymphocytes Relative: 21 %
Lymphs Abs: 1.3 10*3/uL (ref 0.7–4.0)
MCH: 28.7 pg (ref 26.0–34.0)
MCHC: 32.3 g/dL (ref 30.0–36.0)
MCV: 88.9 fL (ref 80.0–100.0)
Monocytes Absolute: 0.4 10*3/uL (ref 0.1–1.0)
Monocytes Relative: 7 %
Neutro Abs: 2.7 10*3/uL (ref 1.7–7.7)
Neutrophils Relative %: 44 %
Platelets: 234 10*3/uL (ref 150–400)
RBC: 4.94 MIL/uL (ref 4.22–5.81)
RDW: 14 % (ref 11.5–15.5)
WBC: 6.2 10*3/uL (ref 4.0–10.5)
nRBC: 0 % (ref 0.0–0.2)

## 2019-08-26 LAB — URINALYSIS, COMPLETE (UACMP) WITH MICROSCOPIC
Bacteria, UA: NONE SEEN
Bilirubin Urine: NEGATIVE
Glucose, UA: 150 mg/dL — AB
Hgb urine dipstick: NEGATIVE
Ketones, ur: NEGATIVE mg/dL
Leukocytes,Ua: NEGATIVE
Nitrite: NEGATIVE
Protein, ur: NEGATIVE mg/dL
Specific Gravity, Urine: 1.009 (ref 1.005–1.030)
pH: 7 (ref 5.0–8.0)

## 2019-08-26 LAB — BLOOD GAS, ARTERIAL
Acid-Base Excess: 3.2 mmol/L — ABNORMAL HIGH (ref 0.0–2.0)
Bicarbonate: 26.8 mmol/L (ref 20.0–28.0)
FIO2: 28
O2 Saturation: 96.8 %
Patient temperature: 36.5
pCO2 arterial: 45.4 mmHg (ref 32.0–48.0)
pH, Arterial: 7.401 (ref 7.350–7.450)
pO2, Arterial: 91.7 mmHg (ref 83.0–108.0)

## 2019-08-26 LAB — COMPREHENSIVE METABOLIC PANEL
ALT: 14 U/L (ref 0–44)
AST: 17 U/L (ref 15–41)
Albumin: 3.6 g/dL (ref 3.5–5.0)
Alkaline Phosphatase: 68 U/L (ref 38–126)
Anion gap: 7 (ref 5–15)
BUN: 17 mg/dL (ref 8–23)
CO2: 31 mmol/L (ref 22–32)
Calcium: 9.1 mg/dL (ref 8.9–10.3)
Chloride: 105 mmol/L (ref 98–111)
Creatinine, Ser: 1.2 mg/dL (ref 0.61–1.24)
GFR calc Af Amer: >60 mL/min (ref >60)
GFR calc non Af Amer: >60 mL/min (ref >60)
Glucose, Bld: 91 mg/dL (ref 70–99)
Potassium: 4.1 mmol/L (ref 3.5–5.1)
Sodium: 143 mmol/L (ref 135–145)
Total Bilirubin: 0.6 mg/dL (ref 0.3–1.2)
Total Protein: 7.1 g/dL (ref 6.5–8.1)

## 2019-08-26 LAB — RAPID URINE DRUG SCREEN, HOSP PERFORMED
Amphetamines: NOT DETECTED
Barbiturates: NOT DETECTED
Benzodiazepines: NOT DETECTED
Cocaine: NOT DETECTED
Opiates: NOT DETECTED
Tetrahydrocannabinol: NOT DETECTED

## 2019-08-26 LAB — TROPONIN I (HIGH SENSITIVITY)
Troponin I (High Sensitivity): <2 ng/L (ref <18)
Troponin I (High Sensitivity): <2 ng/L (ref <18)

## 2019-08-26 LAB — LACTIC ACID, PLASMA: Lactic Acid, Venous: 1.6 mmol/L (ref 0.5–1.9)

## 2019-08-26 LAB — CBG MONITORING, ED
Glucose-Capillary: 63 mg/dL — ABNORMAL LOW (ref 70–99)
Glucose-Capillary: 141 mg/dL — ABNORMAL HIGH (ref 70–99)

## 2019-08-26 LAB — ETHANOL: Alcohol, Ethyl (B): <10 mg/dL (ref <10)

## 2019-08-26 MED ORDER — DEXTROSE 50 % IV SOLN
INTRAVENOUS | Status: AC
  Filled 2019-08-26: qty 50

## 2019-08-26 MED ORDER — DEXTROSE 50 % IV SOLN
1.0000 | Freq: Once | INTRAVENOUS | Status: AC
  Administered 2019-08-26: 50 mL via INTRAVENOUS

## 2019-08-26 NOTE — ED Provider Notes (Signed)
[facility] EMERGENCY DEPARTMENT Provider Note   CSN: [id number] Arrival date & time: 08/26/19  1631     History Chief Complaint  Patient presents with  . Altered Mental Status    Jose Schaefer is a 69 y.o. male with a history of COPD, CVA with left-sided weakness and early dementia presenting with increased confusion and inability to perform his ADLs which started today. He lives with his daughter Earlie Server who reports that she noticed him being slightly more confused yesterday evening but then today he has had poor p.o. intake along with difficulty dressing himself and performing normal ADLs that he was able to do yesterday. He does have some urinary incontinence at baseline, she noticed his urine has been more pungent than normal. He does have a history of UTIs in the past. He has had no fevers, no cough, vomiting or diarrhea. She gave him Ensure to drink for breakfast this morning which he refused and has had no other p.o. intake today.  She endorses his last cva has caused residual left sided weakness.  The history is provided by a relative (daughter). The history is limited by the condition of the patient.       Past Medical History:  Diagnosis Date  . Asthma   . COPD (chronic obstructive pulmonary disease) (HCC)   . CVA (cerebral vascular accident) [facility]) 2001   [place]  . Dementia (HCC)   . Tobacco abuse     Patient Active Problem List   Diagnosis Date Noted  . Dementia (HCC) 08/27/2019  . AMS (altered mental status) 08/27/2019  . Altered mental status 08/27/2019  . Aspiration pneumonia of right lung (HCC)   . Esophagitis   . Abnormal CT scan, esophagus   . Malnutrition of moderate degree 10/10/2018  . COPD exacerbation (HCC) 10/09/2018  . Dysphagia 10/09/2018  . Aspiration into airway 10/09/2018  . Lactic acidosis 10/09/2018  . Cerebral infarction due to unspecified mechanism   . Cerebral infarction (HCC) 08/11/2015  . Cocaine abuse (HCC) 08/11/2015  . ARF (acute  renal failure) (HCC) 08/11/2015  . Acute respiratory failure (HCC) 06/26/2011  . COPD (chronic obstructive pulmonary disease) with acute bronchitis (HCC) 06/26/2011  . Tobacco abuse   . Asthma     Past Surgical History:  Procedure Laterality Date  . ESOPHAGEAL DILATION N/A 10/13/2018   Procedure: ESOPHAGEAL DILATION;  Surgeon: West Bali, MD;  Location: [facility];  Service: Endoscopy;  Laterality: N/A;  . ESOPHAGOGASTRODUODENOSCOPY N/A 10/13/2018   Procedure: ESOPHAGOGASTRODUODENOSCOPY (EGD);  Surgeon: West Bali, MD;  Location: [facility]ITE;  Service: Endoscopy;  Laterality: N/A;       History reviewed. No pertinent family history.  Social History   Tobacco Use  . Smoking status: Current Every Day Smoker    Packs/day: 0.50    Years: 40.00    Pack years: 20.00    Types: Cigarettes  . Smokeless tobacco: Never Used  Substance Use Topics  . Alcohol use: Not Currently    Alcohol/week: 14.0 standard drinks    Types: 4 Cans of beer, 10 Shots of liquor per week    Comment: as much as i can get, not current per daughter  . Drug use: Not Currently    Types: Cocaine, Marijuana    Home Medications Prior to Admission medications   Medication Sig Start Date End Date Taking? Authorizing Provider  albuterol (PROVENTIL HFA;VENTOLIN HFA) 108 (90 Base) MCG/ACT inhaler Inhale 2 puffs into the lungs every 2 (two) hours  as needed for wheezing or shortness of breath (cough). 01/04/17  Yes Pollina, Gwenyth Allegra, MD  aspirin 81 MG EC tablet Take 81 mg by mouth daily. Swallow whole.   Yes [provider]  atorvastatin (LIPITOR) 40 MG tablet Take 40 mg by mouth daily.   Yes [provider]  ipratropium-albuterol (DUONEB) 0.5-2.5 (3) MG/3ML SOLN Take 3 mLs by nebulization in the morning, at noon, in the evening, and at bedtime.   Yes [provider]  Maltodextrin-Xanthan Gum ([place]) POWD Use as directed to thicken liquids to nectar  consistency Patient taking differently: Take by mouth 3 (three) times daily with meals. Use as directed to thicken liquids to nectar consistency 10/13/18  Yes Johnson, Clanford L, MD  mirtazapine (REMERON) 15 MG tablet Take 7.5 mg by mouth at bedtime.   Yes [provider]    Allergies    Patient has no known allergies.  Review of Systems   Review of Systems  Unable to perform ROS: Mental status change    Physical Exam Updated Vital Signs BP 117/86   Pulse 67   Temp 97.7 F (36.5 C) (Oral)   Resp 12   Ht 5\' 8"  (1.727 m)   Wt 54.4 kg   SpO2 100%   BMI 18.25 kg/m   Physical Exam Vitals and nursing note reviewed.  Constitutional:      Appearance: He is well-developed.     Comments: Cooperative. Is able to follow simple directions.  HENT:     Head: Normocephalic and atraumatic.     Mouth/Throat:     Mouth: Mucous membranes are moist.  Eyes:     Conjunctiva/sclera: Conjunctivae normal.  Cardiovascular:     Rate and Rhythm: Normal rate and regular rhythm.     Heart sounds: Normal heart sounds.  Pulmonary:     Effort: Pulmonary effort is normal.     Breath sounds: Normal breath sounds. No wheezing.  Abdominal:     General: Bowel sounds are normal.     Palpations: Abdomen is soft.     Tenderness: There is no abdominal tenderness.  Musculoskeletal:        General: Normal range of motion.     Cervical back: Normal range of motion.  Skin:    General: Skin is warm and dry.     Capillary Refill: Capillary refill takes less than 2 seconds.  Neurological:     Mental Status: He is alert. He is confused.     Cranial Nerves: Cranial nerves are intact.     Motor: Weakness present.     Comments: Patient moves both upper extremities, his grip strength is asymmetric, 5+ right 3+ left, he is unable to make a full fist with his left hand. He is able to flex and extend his ankles, but will not flex his left knee while supine, he can actively flex his right knee. Unable to  perform pronator drift.     ED Results / Procedures / Treatments   Labs (all labs ordered are listed, but only abnormal results are displayed) Labs Reviewed  CBC WITH DIFFERENTIAL/PLATELET - Abnormal; Notable for the following components:      Result Value   Eosinophils Absolute 1.7 (*)    All other components within normal limits  URINALYSIS, COMPLETE (UACMP) WITH MICROSCOPIC - Abnormal; Notable for the following components:   Color, Urine STRAW (*)    Glucose, UA 150 (*)    All other components within normal limits  BLOOD GAS,  ARTERIAL - Abnormal; Notable for the following components:   Acid-Base Excess 3.2 (*)    All other components within normal limits  CBG MONITORING, ED - Abnormal; Notable for the following components:   Glucose-Capillary 63 (*)    All other components within normal limits  CBG MONITORING, ED - Abnormal; Notable for the following components:   Glucose-Capillary 141 (*)    All other components within normal limits  SARS CORONAVIRUS 2 (TAT 6-24 HRS)  COMPREHENSIVE METABOLIC PANEL  RAPID URINE DRUG SCREEN, HOSP PERFORMED  ETHANOL  LACTIC ACID, PLASMA  TROPONIN I (HIGH SENSITIVITY)  TROPONIN I (HIGH SENSITIVITY)    EKG EKG Interpretation  Date/Time:  Monday August 26 2019 16:56:16 EST Ventricular Rate:  66 PR Interval:    QRS Duration: 77 QT Interval:  417 QTC Calculation: 437 R Axis:   17 Text Interpretation: Sinus rhythm Ventricular bigeminy Borderline low voltage, extremity leads Anteroseptal infarct, old No significant change was found Confirmed by Glynn Octave [phone]) on 08/26/2019 5:05:04 PM   Radiology DG Chest 1 View  Result Date: 08/26/2019 CLINICAL DATA:  Cough and congestion. EXAM: CHEST  1 VIEW COMPARISON:  June 07, 2019 FINDINGS: Mild diffusely increased lung markings are seen without evidence of acute infiltrate, pleural effusion or pneumothorax. The heart size and mediastinal contours are within normal limits. The visualized  skeletal structures are unremarkable. IMPRESSION: No active disease. Electronically Signed   By: Aram Candela M.D.   On: 08/26/2019 19:17   CT Head Wo Contrast  Result Date: 08/26/2019 CLINICAL DATA:  Focal neuro deficit, altered mental status worsening since yesterday EXAM: CT HEAD WITHOUT CONTRAST TECHNIQUE: Contiguous axial images were obtained from the base of the skull through the vertex without intravenous contrast. COMPARISON:  08/11/2015 FINDINGS: Brain: Extensive chronic microvascular ischemic changes worse than on the prior study. Signs of atrophy. No acute intracranial hemorrhage or evidence of mass effect or midline shift. Extensive white matter disease and signs of prior infarct are noted. There is loss of gray-white differentiation along the anterior limb of the internal capsule and right basal ganglia that was not present previously though the prior exam was from 2017. Worsening atrophy. Vascular: No hyperdense vessel or unexpected calcification. Skull: Normal. Negative for fracture or focal lesion. Sinuses/Orbits: Ethmoid sinus disease. Paranasal sinuses are incompletely imaged. Other: None. IMPRESSION: 1. Extensive chronic microvascular ischemic changes, atrophy and signs of prior infarct. 2. Age-indeterminate infarct considered at the anterior limb of right internal capsule and basal ganglia 3. No acute intracranial hemorrhage. 4. Ethmoid sinus disease. Electronically Signed   By: Donzetta Kohut M.D.   On: 08/26/2019 19:23    Procedures Procedures (including critical care time)  Medications Ordered in ED Medications  dextrose 50 % solution 50 mL ( Intravenous Not Given 08/26/19 1826)    ED Course  I have reviewed the triage vital signs and the nursing notes.  Pertinent labs & imaging results that were available during my care of the patient were reviewed by me and considered in my medical decision making (see chart for details).    MDM Rules/Calculators/A&P                       Conversation per telephone with daughter Earlie Server who is his primary caregiver. She suspects he may have another UTI. She has not noted any worsening left-sided weakness beyond his baseline. She is very motivated for the patient to return home this evening, she has adequate care at home for  him if he does require additional help with his ADLs.  1:58 AM Advised patient's daughter of findings, no UTI or other obvious metabolic source of his increased confusion.  Discussed CT findings and it is unclear the acuity of these findings, given his symptoms started yesterday evening it is possible that this is the source for patient's symptoms.  She is aware we plan to keep him in the hospital for further testing and she is agreeable to this.  Labs and CT imaging were reviewed.  He has an age-indeterminate infarct at the right internal capsule and basal ganglia on brain CT imaging.  Patient will need admission for a stroke work-up.  Patient was seen by Dr. Manus Gunning during this ED evaluation and agrees with plan. Final Clinical Impression(s) / ED Diagnoses Final diagnoses:  Cerebrovascular accident (CVA), unspecified mechanism [facility])    Rx / DC Orders ED Discharge Orders    None       Victoriano Lain 08/27/19 0158    Glynn Octave, MD 08/27/19 1253

## 2019-08-26 NOTE — ED Triage Notes (Signed)
Pt has increased altered mental status since yesterday. Usually is able to carry out his ADLs but has not today and was incontinent. Pt can usually do his own nebulizer tx and was given one by ems.

## 2019-08-26 NOTE — ED Notes (Signed)
Pt to CT

## 2019-08-27 ENCOUNTER — Inpatient Hospital Stay (HOSPITAL_COMMUNITY): Payer: Medicare Other

## 2019-08-27 ENCOUNTER — Other Ambulatory Visit: Payer: Self-pay

## 2019-08-27 ENCOUNTER — Encounter (HOSPITAL_COMMUNITY): Payer: Self-pay | Admitting: Internal Medicine

## 2019-08-27 DIAGNOSIS — Z79899 Other long term (current) drug therapy: Secondary | ICD-10-CM | POA: Diagnosis not present

## 2019-08-27 DIAGNOSIS — Z87440 Personal history of urinary (tract) infections: Secondary | ICD-10-CM | POA: Diagnosis not present

## 2019-08-27 DIAGNOSIS — I69354 Hemiplegia and hemiparesis following cerebral infarction affecting left non-dominant side: Secondary | ICD-10-CM | POA: Diagnosis not present

## 2019-08-27 DIAGNOSIS — R4182 Altered mental status, unspecified: Secondary | ICD-10-CM | POA: Diagnosis present

## 2019-08-27 DIAGNOSIS — F17210 Nicotine dependence, cigarettes, uncomplicated: Secondary | ICD-10-CM | POA: Diagnosis present

## 2019-08-27 DIAGNOSIS — I6300 Cerebral infarction due to thrombosis of unspecified precerebral artery: Secondary | ICD-10-CM

## 2019-08-27 DIAGNOSIS — R1310 Dysphagia, unspecified: Secondary | ICD-10-CM | POA: Diagnosis present

## 2019-08-27 DIAGNOSIS — Z7982 Long term (current) use of aspirin: Secondary | ICD-10-CM | POA: Diagnosis not present

## 2019-08-27 DIAGNOSIS — R410 Disorientation, unspecified: Secondary | ICD-10-CM

## 2019-08-27 DIAGNOSIS — F0390 Unspecified dementia without behavioral disturbance: Secondary | ICD-10-CM | POA: Diagnosis present

## 2019-08-27 DIAGNOSIS — I6389 Other cerebral infarction: Secondary | ICD-10-CM | POA: Diagnosis not present

## 2019-08-27 DIAGNOSIS — I493 Ventricular premature depolarization: Secondary | ICD-10-CM | POA: Diagnosis present

## 2019-08-27 DIAGNOSIS — G9341 Metabolic encephalopathy: Secondary | ICD-10-CM | POA: Diagnosis present

## 2019-08-27 DIAGNOSIS — J449 Chronic obstructive pulmonary disease, unspecified: Secondary | ICD-10-CM | POA: Diagnosis present

## 2019-08-27 DIAGNOSIS — I6381 Other cerebral infarction due to occlusion or stenosis of small artery: Secondary | ICD-10-CM | POA: Diagnosis present

## 2019-08-27 DIAGNOSIS — Z20822 Contact with and (suspected) exposure to covid-19: Secondary | ICD-10-CM | POA: Diagnosis present

## 2019-08-27 DIAGNOSIS — Z8719 Personal history of other diseases of the digestive system: Secondary | ICD-10-CM | POA: Diagnosis not present

## 2019-08-27 DIAGNOSIS — E785 Hyperlipidemia, unspecified: Secondary | ICD-10-CM | POA: Diagnosis present

## 2019-08-27 LAB — ECHOCARDIOGRAM COMPLETE
Height: 68 in
Weight: 1920 oz

## 2019-08-27 LAB — LIPID PANEL
Cholesterol: 184 mg/dL (ref 0–200)
HDL: 44 mg/dL (ref >40)
LDL Cholesterol: 124 mg/dL — ABNORMAL HIGH (ref 0–99)
Total CHOL/HDL Ratio: 4.2 RATIO
Triglycerides: 82 mg/dL (ref <150)
VLDL: 16 mg/dL (ref 0–40)

## 2019-08-27 LAB — TSH: TSH: 0.216 u[IU]/mL — ABNORMAL LOW (ref 0.350–4.500)

## 2019-08-27 LAB — SARS CORONAVIRUS 2 (TAT 6-24 HRS): SARS Coronavirus 2: NEGATIVE

## 2019-08-27 LAB — VITAMIN B12: Vitamin B-12: 477 pg/mL (ref 180–914)

## 2019-08-27 LAB — HEMOGLOBIN A1C
Hgb A1c MFr Bld: 6 % — ABNORMAL HIGH (ref 4.8–5.6)
Mean Plasma Glucose: 125.5 mg/dL

## 2019-08-27 MED ORDER — ATORVASTATIN CALCIUM 40 MG PO TABS
40.0000 mg | ORAL_TABLET | Freq: Every day | ORAL | Status: DC
  Filled 2019-08-27: qty 1

## 2019-08-27 MED ORDER — IPRATROPIUM-ALBUTEROL 0.5-2.5 (3) MG/3ML IN SOLN: 3.0000 mL | Freq: Once | RESPIRATORY_TRACT | Status: DC

## 2019-08-27 MED ORDER — STROKE: EARLY STAGES OF RECOVERY BOOK
Freq: Once | Status: DC
  Filled 2019-08-27: qty 1

## 2019-08-27 MED ORDER — BUDESONIDE-FORMOTEROL FUMARATE 160-4.5 MCG/ACT IN AERO: 2.0000 | INHALATION_SPRAY | Freq: Two times a day (BID) | RESPIRATORY_TRACT | 3 refills | Status: DC

## 2019-08-27 MED ORDER — CHLORHEXIDINE GLUCONATE 0.12 % MT SOLN: 15.0000 mL | Freq: Two times a day (BID) | OROMUCOSAL | Status: DC

## 2019-08-27 MED ORDER — ALBUTEROL SULFATE HFA 108 (90 BASE) MCG/ACT IN AERS: 2.0000 | INHALATION_SPRAY | RESPIRATORY_TRACT | 1 refills | Status: AC | PRN

## 2019-08-27 MED ORDER — SENNOSIDES-DOCUSATE SODIUM 8.6-50 MG PO TABS
1.0000 | ORAL_TABLET | Freq: Every evening | ORAL | Status: DC | PRN
  Filled 2019-08-27: qty 1

## 2019-08-27 MED ORDER — CLOPIDOGREL BISULFATE 75 MG PO TABS: 75.0000 mg | ORAL_TABLET | Freq: Every day | ORAL | 4 refills | Status: DC

## 2019-08-27 MED ORDER — IPRATROPIUM-ALBUTEROL 0.5-2.5 (3) MG/3ML IN SOLN: 3.0000 mL | Freq: Once | RESPIRATORY_TRACT | Status: AC

## 2019-08-27 MED ORDER — MIRTAZAPINE 15 MG PO TABS: 7.5000 mg | ORAL_TABLET | Freq: Every day | ORAL | Status: DC

## 2019-08-27 MED ORDER — ASPIRIN 81 MG PO TBEC: 81.0000 mg | DELAYED_RELEASE_TABLET | Freq: Every day | ORAL | 1 refills | Status: DC

## 2019-08-27 MED ORDER — IPRATROPIUM-ALBUTEROL 0.5-2.5 (3) MG/3ML IN SOLN: 3.0000 mL | Freq: Four times a day (QID) | RESPIRATORY_TRACT | Status: DC

## 2019-08-27 MED ORDER — ACETAMINOPHEN 325 MG PO TABS: 650.0000 mg | ORAL_TABLET | ORAL | 0 refills | Status: DC | PRN

## 2019-08-27 MED ORDER — IPRATROPIUM-ALBUTEROL 20-100 MCG/ACT IN AERS: 2.0000 | INHALATION_SPRAY | Freq: Once | RESPIRATORY_TRACT | Status: DC

## 2019-08-27 MED ORDER — IPRATROPIUM-ALBUTEROL 0.5-2.5 (3) MG/3ML IN SOLN: 3.0000 mL | Freq: Four times a day (QID) | RESPIRATORY_TRACT | 2 refills | Status: AC | PRN

## 2019-08-27 MED ORDER — ATORVASTATIN CALCIUM 80 MG PO TABS: 40.0000 mg | ORAL_TABLET | Freq: Every evening | ORAL | 11 refills | Status: DC

## 2019-08-27 MED ORDER — HEPARIN SODIUM (PORCINE) 5000 UNIT/ML IJ SOLN
5000.0000 [IU] | Freq: Three times a day (TID) | INTRAMUSCULAR | Status: DC
  Administered 2019-08-27: 5000 [IU] via SUBCUTANEOUS
  Filled 2019-08-27: qty 1

## 2019-08-27 MED ORDER — SENNOSIDES-DOCUSATE SODIUM 8.6-50 MG PO TABS: 2.0000 | ORAL_TABLET | Freq: Every day | ORAL | 1 refills | Status: DC

## 2019-08-27 MED ORDER — ALBUTEROL SULFATE HFA 108 (90 BASE) MCG/ACT IN AERS: 2.0000 | INHALATION_SPRAY | RESPIRATORY_TRACT | Status: DC | PRN

## 2019-08-27 MED ORDER — MIRTAZAPINE 15 MG PO TABS: 7.5000 mg | ORAL_TABLET | Freq: Every day | ORAL | 1 refills | Status: AC

## 2019-08-27 MED ORDER — ASPIRIN EC 81 MG PO TBEC
81.0000 mg | DELAYED_RELEASE_TABLET | Freq: Every day | ORAL | Status: DC
  Filled 2019-08-27 (×2): qty 1

## 2019-08-27 MED ORDER — ACETAMINOPHEN 325 MG PO TABS: 650.0000 mg | ORAL_TABLET | ORAL | Status: DC | PRN

## 2019-08-27 MED ORDER — GUAIFENESIN 100 MG/5ML PO SOLN: 5.0000 mL | ORAL | 0 refills | Status: DC | PRN

## 2019-08-27 MED ORDER — IPRATROPIUM-ALBUTEROL 0.5-2.5 (3) MG/3ML IN SOLN
3.0000 mL | Freq: Once | RESPIRATORY_TRACT | Status: AC
  Administered 2019-08-27: 3 mL via RESPIRATORY_TRACT
  Filled 2019-08-27: qty 3

## 2019-08-27 MED ORDER — ACETAMINOPHEN 650 MG RE SUPP: 650.0000 mg | RECTAL | Status: DC | PRN

## 2019-08-27 MED ORDER — ORAL CARE MOUTH RINSE: 15.0000 mL | Freq: Two times a day (BID) | OROMUCOSAL | Status: DC

## 2019-08-27 MED ORDER — ACETAMINOPHEN 160 MG/5ML PO SOLN: 650.0000 mg | ORAL | Status: DC | PRN

## 2019-08-27 NOTE — Progress Notes (Signed)
Discharge instructions given to patient and daughter with understanding stated

## 2019-08-27 NOTE — TOC Transition Note (Signed)
Transition of Care [facility]) - CM/SW Discharge Note   Patient Details  Name: Jose Schaefer MRN: [id number] Date of Birth: Jun 29, 1951  Transition of Care [facility]) CM/SW Contact:  Jose Gootee Sherryle Lis, LCSW Phone Number: 08/27/2019, 12:17 PM   Clinical Narrative:   CSW in contact with patient via room phone to offer home health services. Patient does not have a preferred home health provider. Patient is fine with [facility] being [facility] to [facility]   Patient discharging home today with home health PT/ST from [facility]. Referral received by Alroy Bailiff. No futher needs at this time.   Jose Schaefer [name] LCSWA Transitions of Care  Clinical Social Worker  Ph: [phone]    Final next level of care: Home w Home Health Services Barriers to Discharge: No Barriers Identified   Patient Goals and CMS Choice Patient states their goals for this hospitalization and ongoing recovery are:: to return home with family supevision CMS Medicare.gov Compare Post Acute Care list provided to:: Patient Choice offered to / list presented to : Patient  Discharge Placement                  Name of family member notified: Jose Schaefer [phone] Patient and family notified of of transfer: 08/27/19  Discharge Plan and Services                          HH Arranged: PT, Speech Therapy HH Agency: [facility] Date HH Agency Contacted: 08/27/19 Time HH Agency Contacted: 1216 Representative spoke with at [facility] Agency: Alroy Bailiff  Social Determinants of Health (SDOH) Interventions     Readmission Risk Interventions No flowsheet data found.

## 2019-08-27 NOTE — Evaluation (Signed)
Clinical/Bedside Swallow Evaluation Patient Details  Name: Jose Schaefer MRN: [id number] Date of Birth: 1951/04/14  Today's Date: 08/27/2019 Time: SLP Start Time (ACUTE ONLY): 1259 SLP Stop Time (ACUTE ONLY): 1325 SLP Time Calculation (min) (ACUTE ONLY): 26 min  Past Medical History:  Past Medical History:  Diagnosis Date  . Asthma   . COPD (chronic obstructive pulmonary disease) (HCC)   . CVA (cerebral vascular accident) [facility]) 2001   [place]  . Dementia (HCC)   . Tobacco abuse    Past Surgical History:  Past Surgical History:  Procedure Laterality Date  . ESOPHAGEAL DILATION N/A 10/13/2018   Procedure: ESOPHAGEAL DILATION;  Surgeon: West Bali, MD;  Location[facility];  Service: Endoscopy;  Laterality: N/A;  . ESOPHAGOGASTRODUODENOSCOPY N/A 10/13/2018   Procedure: ESOPHAGOGASTRODUODENOSCOPY (EGD);  Surgeon: West Bali, MD;  Location: [facility];  Service: Endoscopy;  Laterality: N/A;   HPI:  Patient presented with increased confusion, poor p.o. intake, decreased ambulation, increased weakness.  Has a history of previous CVA with mild left-sided residual weakness as well as early dementia as per family.  At baseline is able to ambulate and perform his ADLs.  Significant decline over the last 48 hours.  Noted to have left-sided weakness on my exam which is apparently at baseline.  No obvious focal deficits.  Noted to be confused.  CT of the head shows age-indeterminate infarct in the anterior limb of the right internal capsule and basal ganglia.  Because of increased confusion is unclear but concern for recent CVA given head CT findings.  Differential diagnosis possibly also include metabolic encephalopathy although no obvious trigger.  Patient does not have any signs of infectious etiology with normal chest x-ray as well as urinalysis.  No white count, fever noted.  Normal lactic acid.  ABG shows no evidence of hypercapnia.  Renal function appears to be at baseline. MRI shows:  Possible small acute lacunar infarct in the left posterior   MBSS 10/10/18:  <<Pt presents with moderate oropharyngeal phase dysphagia characterized by impaired lingual manipulation resulting in reduced bolus cohesiveness, piecemeal deglutition, and premature spillage; pharyngeal phase is characterized by delay in swallow initiation and reduced laryngeal closure resulting in penetration and aspiration during the swallow with thin liquids (cough but not removed) between the arytenoids. Pt with penetration of nectars, but expelled before aspiration. Chin tuck was trialed and found to be effective when provided max cues from SLP (tsp thin with cues for chin tuck), he was not able to implement independently. Pt with prolonged oral transit with solid textures and delays in the valleculae for up to 7 seconds. Pt with questionable pharyngocele near valleculae which retained barium during the study. Recommend D1/puree with NTL via cup sips, no straws, and supervision with meals. Pt can likely be upgraded to soft textures after caregiver education. No family present today and SLP made phone call, however voicemail box was full. SLP to follow.>>   Assessment / Plan / Recommendation Clinical Impression  Pt known to SLP service from previous admissions. He had MBSS 10/10/18 with recommendation for puree and NTL. Pt reportedly has been consuming purees and NTL at home, but has been known to "cheat" and has gotten "choked". Pt elevated to upright, cued cough is congested and suspect some wheezing. Pt presented with ice chips, small sips thin water, NTL, puree, and mech soft. Pt with suspected delay in swallow initiation and immediate coughing after self presented cup sips of thin. Pt with improved performance with NTL and  SLP to assist with bolus control (rate and size).  OK to resume previous diet (Pt is leaving for home imminently) with aspiration and reflux precautions. Suspect Pt will have repeat bouts of suspected  aspiration PNA and family acknowledged this during previous visit, however they are not present today. Dr. Mariea Clonts has had discussions with Pt family this visit. SLP will sign off.   SLP Visit Diagnosis: Dysphagia, unspecified (R13.10)    Aspiration Risk  Moderate aspiration risk    Diet Recommendation Dysphagia 1 (Puree);Thin liquid   Liquid Administration via: Cup Medication Administration: Whole meds with puree Supervision: Patient able to self feed;Intermittent supervision to cue for compensatory strategies Compensations: Slow rate;Small sips/bites Postural Changes: Seated upright at 90 degrees;Remain upright for at least 30 minutes after po intake    Other  Recommendations Oral Care Recommendations: Oral care BID Other Recommendations: Order thickener from pharmacy;Clarify dietary restrictions   Follow up Recommendations None      Frequency and Duration            Prognosis Prognosis for Safe Diet Advancement: Guarded Barriers to Reach Goals: Severity of deficits;Behavior      Swallow Study   General Date of Onset: 08/26/19 HPI: Patient presented with increased confusion, poor p.o. intake, decreased ambulation, increased weakness.  Has a history of previous CVA with mild left-sided residual weakness as well as early dementia as per family.  At baseline is able to ambulate and perform his ADLs.  Significant decline over the last 48 hours.  Noted to have left-sided weakness on my exam which is apparently at baseline.  No obvious focal deficits.  Noted to be confused.  CT of the head shows age-indeterminate infarct in the anterior limb of the right internal capsule and basal ganglia.  Because of increased confusion is unclear but concern for recent CVA given head CT findings.  Differential diagnosis possibly also include metabolic encephalopathy although no obvious trigger.  Patient does not have any signs of infectious etiology with normal chest x-ray as well as urinalysis.  No  white count, fever noted.  Normal lactic acid.  ABG shows no evidence of hypercapnia.  Renal function appears to be at baseline. MRI shows: Possible small acute lacunar infarct in the left posterior Type of Study: Bedside Swallow Evaluation Previous Swallow Assessment: MBSS 10/10/18: puree and NTL Diet Prior to this Study: NPO Temperature Spikes Noted: No Respiratory Status: Room air History of Recent Intubation: No Behavior/Cognition: Alert;Cooperative;Pleasant mood Oral Cavity Assessment: Within Functional Limits Oral Care Completed by SLP: Yes Oral Cavity - Dentition: Edentulous Vision: Functional for self-feeding Self-Feeding Abilities: Able to feed self Patient Positioning: Upright in bed Baseline Vocal Quality: Normal(wheezing) Volitional Cough: Congested;Strong Volitional Swallow: Able to elicit    Oral/Motor/Sensory Function Overall Oral Motor/Sensory Function: Within functional limits   Ice Chips Ice chips: Within functional limits Presentation: Spoon   Thin Liquid Thin Liquid: Impaired Presentation: Cup;Self Fed Pharyngeal  Phase Impairments: Cough - Immediate    Nectar Thick Nectar Thick Liquid: Impaired Presentation: Cup;Self Fed Pharyngeal Phase Impairments: Cough - Delayed   Honey Thick Honey Thick Liquid: Not tested   Puree Puree: Within functional limits Presentation: Spoon   Solid     Solid: Impaired Presentation: Spoon Oral Phase Impairments: Impaired mastication     Thank you,  Havery Moros, CCC-SLP [phone]  Jose Schaefer 08/27/2019,1:33 PM

## 2019-08-27 NOTE — Evaluation (Signed)
Physical Therapy Evaluation Patient Details Name: Jose Schaefer MRN: [id number] DOB: 1951-06-08 Today's Date: 08/27/2019   History of Present Illness  Patient presented with increased confusion, poor p.o. intake, decreased ambulation, increased weakness.  Has a history of previous CVA with mild left-sided residual weakness as well as early dementia as per family.  At baseline is able to ambulate and perform his ADLs.  Significant decline over the last 48 hours.  Noted to have left-sided weakness on my exam which is apparently at baseline.  No obvious focal deficits.  Noted to be confused.  CT of the head shows age-indeterminate infarct in the anterior limb of the right internal capsule and basal ganglia.  Because of increased confusion is unclear but concern for recent CVA given head CT findings.  Differential diagnosis possibly also include metabolic encephalopathy although no obvious trigger.  Patient does not have any signs of infectious etiology with normal chest x-ray as well as urinalysis.  No white count, fever noted.  Normal lactic acid.  ABG shows no evidence of hypercapnia.  Renal function appears to be at baseline.     Clinical Impression  Patient functioning near baseline for functional mobility and gait but is limited for functional mobility as stated below secondary to BLE weakness, fatigue and poor standing balance. Patient able to transfer to standing with supervision but requires min A to remain standing and while taking few small steps at bedside. He is limited by not feeling well during today's session. Patient will benefit from continued physical therapy in hospital and recommended venue below to increase strength, balance, endurance for safe ADLs and gait.      Follow Up Recommendations Home health PT;Supervision - Intermittent;Supervision for mobility/OOB    Equipment Recommendations  None recommended by PT    Recommendations for Other Services       Precautions /  Restrictions Precautions Precautions: Fall Restrictions Weight Bearing Restrictions: No      Mobility  Bed Mobility Overal bed mobility: Modified Independent                Transfers Overall transfer level: Needs assistance Equipment used: None Transfers: Sit to/from Stand;Stand Pivot Transfers Sit to Stand: Supervision Stand pivot transfers: Supervision       General transfer comment: Pt standing at EOB without AD demonstrating unsteadiness and required min A to continue standing due to not feeling well  Ambulation/Gait Ambulation/Gait assistance: Min assist;Min guard Gait Distance (Feet): 4 Feet Assistive device: None Gait Pattern/deviations: Decreased step length - right;Decreased step length - left;Decreased stride length;Shuffle Gait velocity: decreased   General Gait Details: Patient limited to few small steps at bedside due to not feeling well  Stairs            Wheelchair Mobility    Modified Rankin (Stroke Patients Only)       Balance Overall balance assessment: Needs assistance Sitting-balance support: Feet supported;No upper extremity supported Sitting balance-Leahy Scale: Good Sitting balance - Comments: seated EOB   Standing balance support: No upper extremity supported Standing balance-Leahy Scale: Poor Standing balance comment: Fair/ poor standing without AD                             Pertinent Vitals/Pain Pain Assessment: No/denies pain    Home Living Family/patient expects to be discharged to:: Private residence Living Arrangements: Children(daughter) Available Help at Discharge: Family;Available PRN/intermittently Type of Home: House Home Access: Stairs to enter Entrance Stairs-Rails: Right;Left;Can reach  both Entrance Stairs-Number of Steps: 2 Home Layout: Two level;Able to live on main level with bedroom/bathroom Home Equipment: Bedside commode      Prior Function Level of Independence: Independent   Gait  / Transfers Assistance Needed: Reports no DME, independent with mobility.  ADL's / Homemaking Assistance Needed: Reports independence with ADLs, daughter is available to assist if needed  Comments: Reports he doesn't move around much during the day     Hand Dominance   Dominant Hand: Right    Extremity/Trunk Assessment   Upper Extremity Assessment Upper Extremity Assessment: Defer to OT evaluation    Lower Extremity Assessment Lower Extremity Assessment: Generalized weakness    Cervical / Trunk Assessment Cervical / Trunk Assessment: Kyphotic  Communication   Communication: No difficulties  Cognition Arousal/Alertness: Awake/alert Behavior During Therapy: WFL for tasks assessed/performed Overall Cognitive Status: Within Functional Limits for tasks assessed                                        General Comments      Exercises     Assessment/Plan    PT Assessment Patient needs continued PT services  PT Problem List Decreased strength;Decreased activity tolerance;Decreased balance;Decreased mobility;Decreased knowledge of use of DME       PT Treatment Interventions DME instruction;Gait training;Neuromuscular re-education;Functional mobility training;Therapeutic activities;Therapeutic exercise;Balance training;Patient/family education    PT Goals (Current goals can be found in the Care Plan section)  Acute Rehab PT Goals Patient Stated Goal: return home with family PT Goal Formulation: With patient Time For Goal Achievement: 09/10/19 Potential to Achieve Goals: Good    Frequency Min 3X/week   Barriers to discharge        Co-evaluation               AM-PAC PT "6 Clicks" Mobility  Outcome Measure Help needed turning from your back to your side while in a flat bed without using bedrails?: None Help needed moving from lying on your back to sitting on the side of a flat bed without using bedrails?: None Help needed moving to and from a  bed to a chair (including a wheelchair)?: A Little Help needed standing up from a chair using your arms (e.g., wheelchair or bedside chair)?: A Little Help needed to walk in hospital room?: A Lot Help needed climbing 3-5 steps with a railing? : A Lot 6 Click Score: 18    End of Session Equipment Utilized During Treatment: Gait belt;Oxygen Activity Tolerance: Patient tolerated treatment well;Patient limited by fatigue Patient left: in bed;with call bell/phone within reach;with nursing/sitter in room Nurse Communication: Mobility status PT Visit Diagnosis: Unsteadiness on feet (R26.81);Other abnormalities of gait and mobility (R26.89);Muscle weakness (generalized) (M62.81)    Time: 6962-9528 PT Time Calculation (min) (ACUTE ONLY): 12 min   Charges:   PT Evaluation $PT Eval Low Complexity: 1 Low PT Treatments $Therapeutic Activity: 8-22 mins       10:49 AM, 08/27/19 Mearl Latin PT, DPT Physical Therapist at [facility]

## 2019-08-27 NOTE — Evaluation (Signed)
Occupational Therapy Evaluation Patient Details Name: Jose Schaefer MRN: [id number] DOB: [birth year] Today's Date: 08/27/2019    History of Present Illness Patient presented with increased confusion, poor p.o. intake, decreased ambulation, increased weakness.  Has a history of previous CVA with mild left-sided residual weakness as well as early dementia as per family.  At baseline is able to ambulate and perform his ADLs.  Significant decline over the last 48 hours.  Noted to have left-sided weakness on my exam which is apparently at baseline.  No obvious focal deficits.  Noted to be confused.  CT of the head shows age-indeterminate infarct in the anterior limb of the right internal capsule and basal ganglia.  Because of increased confusion is unclear but concern for recent CVA given head CT findings.  Differential diagnosis possibly also include metabolic encephalopathy although no obvious trigger.  Patient does not have any signs of infectious etiology with normal chest x-ray as well as urinalysis.  No white count, fever noted.  Normal lactic acid.  ABG shows no evidence of hypercapnia.  Renal function appears to be at baseline.   Clinical Impression   Pt agreeable to OT evaluation this am. Pt reports independence in ADLs and mobility tasks PTA, reports he does not do much during the day, mostly sedentary. Pt performing bed mobility and seated ADLs without difficulty, standing at bedside for linen adjustment without LOB. Pt with LUE weakness, baseline since prior CVA. Pt appears to be near baseline with ADL completion, no further acute OT services required at this time. Per chart review family available to assist with ADLs as needed on discharge. Recommend HHOT evaluation to assess pt functioning in home environment.     Follow Up Recommendations  Home health OT    Equipment Recommendations  Tub/shower seat       Precautions / Restrictions Precautions Precautions: Fall Restrictions Weight Bearing  Restrictions: No      Mobility Bed Mobility Overal bed mobility: Modified Independent                Transfers Overall transfer level: Needs assistance Equipment used: None Transfers: Sit to/from Stand Sit to Stand: Supervision         General transfer comment: Pt standing at EOB for linens to be adjusted, using bedrail for stability        ADL either performed or assessed with clinical judgement   ADL Overall ADL's : Needs assistance/impaired     Grooming: Wash/dry hands;Wash/dry face;Set up;Sitting Grooming Details (indicate cue type and reason): Pt performing ADLs seated without difficulty             Lower Body Dressing: Modified independent;Sitting/lateral leans Lower Body Dressing Details (indicate cue type and reason): pt doffing personal socks and donned non-slip socks without difficulty while seated at EOB                General ADL Comments: Pt declined to perform ADLs at sink or need to use restroom. Pt performing ADLs at EOB without difficulty     Vision Baseline Vision/History: No visual deficits Patient Visual Report: No change from baseline Vision Assessment?: No apparent visual deficits            Pertinent Vitals/Pain Pain Assessment: No/denies pain     Hand Dominance Right   Extremity/Trunk Assessment Upper Extremity Assessment Upper Extremity Assessment: Generalized weakness(RUE 4-/5, LUE 3/5; )   Lower Extremity Assessment Lower Extremity Assessment: Defer to PT evaluation   Cervical / Trunk Assessment Cervical / Trunk  Assessment: Kyphotic   Communication Communication Communication: No difficulties   Cognition Arousal/Alertness: Awake/alert Behavior During Therapy: WFL for tasks assessed/performed Overall Cognitive Status: Within Functional Limits for tasks assessed                                                Home Living Family/patient expects to be discharged to:: Private  residence Living Arrangements: Children(daughter) Available Help at Discharge: Family;Available PRN/intermittently Type of Home: House Home Access: Stairs to enter [employer] of Steps: 2 Entrance Stairs-Rails: Right;Left;Can reach both Home Layout: Two level;Able to live on main level with bedroom/bathroom     Bathroom Shower/Tub: Occupational psychologist: Standard     Home Equipment: Bedside commode          Prior Functioning/Environment Level of Independence: Independent  Gait / Transfers Assistance Needed: Reports no DME, independent with mobility. ADL's / Homemaking Assistance Needed: Reports independence with ADLs, daughter is available to assist if needed   Comments: Reports he doesn't move around much during the day        OT Problem List: Decreased strength;Decreased activity tolerance;Decreased knowledge of use of DME or AE;Decreased safety awareness       End of Session Equipment Utilized During Treatment: Oxygen  Activity Tolerance: Patient tolerated treatment well Patient left: in bed;with call bell/phone within reach;with bed alarm set  OT Visit Diagnosis: Muscle weakness (generalized) (M62.81)                Time: 1191-4782 OT Time Calculation (min): 17 min Charges:  OT General Charges $OT Visit: 1 Visit OT Evaluation $OT Eval Low Complexity: [place], OTR/L  [phone] 08/27/2019, 8:05 AM

## 2019-08-27 NOTE — Discharge Summary (Signed)
Jose Schaefer, is a 69 y.o. male  DOB 01/15/1951  MRN [id number].  Admission date:  08/26/2019  Admitting Physician  Lynetta Mare, MD  Discharge Date:  08/27/2019   Primary MD  Clinic, [facility]  Recommendations for primary care physician for things to follow:    1) please follow-up with neurologist Dr. Phillips Odor in 3 to 4 weeks for recheck and reevaluation, Neurologist in [place], [place] Address: [address], [place], [place] [address] Phone: [phone]  2) please take aspirin 81 mg daily along with Plavix 75 mg daily for the next 4 weeks, after that you will stop the aspirin and take Plavix 75 mg daily indefinitely  3) please take Lipitor 80 mg daily, neurologist may reduce your dose to 40 mg daily down the road  4)  physical therapist will come to your house to help you with rehab  5)Continue Pureed Diet with Nectar Thickened Liquids   Admission Diagnosis  Altered mental status [R41.82] Cerebrovascular accident (CVA), unspecified mechanism ([place]) [I63.9] AMS (altered mental status) [R41.82]   Discharge Diagnosis  Altered mental status [R41.82] Cerebrovascular accident (CVA), unspecified mechanism ([place]) [I63.9] AMS (altered mental status) [R41.82]    Principal Problem:   CVA/-Small acute lacunar infarct in the left posterior lentiform/Internal Capsule Active Problems:   Altered mental status/Metabolic Encephalopathy due to Acute CVA   COPD (chronic obstructive pulmonary disease) with acute bronchitis (HCC)   Dementia ([place])   Asthma   Dysphagia      Past Medical History:  Diagnosis Date  . Asthma   . COPD (chronic obstructive pulmonary disease) ([place])   . CVA (cerebral vascular accident) [facility]) 2001   [place]  . Dementia ([place])   . Tobacco abuse     Past Surgical History:  Procedure Laterality Date  . ESOPHAGEAL DILATION N/A 10/13/2018   Procedure:  ESOPHAGEAL DILATION;  Surgeon: Danie Binder, MD;  Location: [facility];  Service: Endoscopy;  Laterality: N/A;  . ESOPHAGOGASTRODUODENOSCOPY N/A 10/13/2018   Procedure: ESOPHAGOGASTRODUODENOSCOPY (EGD);  Surgeon: Danie Binder, MD;  Location: [facility];  Service: Endoscopy;  Laterality: N/A;       HPI  from the history and physical done on the day of admission:   HPI: Jose Schaefer is a 69 y.o. male with medical history significant of CVA, COPD, hyperlipidemia who presented to the ER with altered mental status.  History is obtained from the ER record and the description from the family as the patient is confused and a poor historian and unable to provide history.  Patient lives with his daughter and she noted that he was more confused over the last couple of days and had decreased p.o. intake as well as difficulty with performing his normal ADLs.  He was also noted to have some urinary incontinence.  At baseline patient is a little bit confused however he seemed to decline considerably over the last couple of days.  Daughter was concerned that he had a UTI and brought him in for  further evaluation.  He had not had any fevers, chills, cough, shortness of breath, vomiting, diarrhea.  Has had a history of CVA in the past and has some residual left-sided weakness. ED Course:  Vital Signs reviewed on presentation, significant for temperature 97.7, HR 67, BP 117/86, saturation 100% on room air.  Labs reviewed, significant for ABG shows pH 7.40, PCO2 45, PO2 91.  Sodium 143, potassium 4.1, BUN 17, creatinine 1.2, LFTs within normal limits, troponin negative x2, lactic acid 1.6.  WBC count 6.2, hemoglobin 14.2, hematocrit 40, platelets 234, urinalysis is negative, urine drug screen is negative. Imaging personally Reviewed, chest x-ray shows no acute cardiopulmonary disease.  CT of the head shows extensive chronic microvascular ischemic changes.  There is atrophy and signs of a prior infarction.  There  is an age-indeterminate infarct considered of the anterior limb of the right internal capsule and basal ganglia. EKG personally reviewed, shows sinus rhythm, multiple PVCs.      Hospital Course:     1)Acute Stroke/CVA/-Small acute Lacunar infarct in the left posterior Lentiform/Internal Capsule--in the setting of prior CVA with residual left-sided weakness --patient admitted with new neuro deficits -MRI brain confirms acute stroke -Patient met inpatient criteria at the time of admission due to neuro deficits and acute stroke, however patient's clinical and neurological condition improved quicker than would be expected for this presentation given patient's age, comorbid conditions and prior history of stroke -- aspirin 81 mg daily along with Plavix 75 mg daily for the next 4 weeks, then stop the aspirin and take Plavix 75 mg daily indefinitely -Outpatient follow-up with neurologist Dr. Merlene Laughter -LDL is 124--- start Lipitor 80 mg daily as ordered -TSH is 0.22, repeat as outpatient in about 6 weeks A1c pending -Home health physical therapy and speech therapy requested -Patient was evaluated by PT/OT/speech therapist in the hospital prior to discharge -Echo with preserved EF of 55 to 60% with grade 1 diastolic dysfunction -MRI brain with acute stroke as noted above, -MRA head without acute findings  2)Chronic Dysphagia--- had EGD with dilatation on 09/2018, speech therapy evaluation appreciated -Pured solids and nectar thickened liquids advised -Home health speech therapy requested  3)Acute metabolic encephalopathy--secondary to #1 above, improved  4) dementia--- with occasional confusional episodes, overall stable  5) COPD----not requiring oxygen, patient's nebulizer machine was not working properly PTA due to lack of tubing supplies -Tubing supplies provided for patient today -Bronchodilators prescribed  Discharge Condition: stable  - -Patient met inpatient criteria at the time of  admission due to neuro deficits and acute stroke, however patient's clinical and neurological condition improved quicker than would be expected for this presentation given patient's age, comorbid conditions and prior history of stroke Follow UP  Follow-up Information    [facility]-Home Follow up.   Specialty: Home Health Services Why:   PT       Clinic, [place] Schedule an appointment as soon as possible for a visit in 1 week(s).   Contact information: [place] [address] [phone]        Phillips Odor, MD. Schedule an appointment as soon as possible for a visit in 3 week(s).   Specialty: Neurology Contact information: [address] [place] [address] [phone]            Consults obtained -   Diet and Activity recommendation:  As advised  Discharge Instructions    Discharge Instructions    Call MD for:  difficulty breathing, headache or  visual disturbances   Complete by: As directed    Call MD for:  persistant dizziness or light-headedness   Complete by: As directed    Call MD for:  persistant nausea and vomiting   Complete by: As directed    Call MD for:  temperature >100.4   Complete by: As directed    Diet - low sodium heart healthy   Complete by: As directed    Pureed Diet with Nectar Thickened Liquids   Discharge instructions   Complete by: As directed    1) please follow-up with neurologist Dr. Phillips Odor in 3 to 4 weeks for recheck and reevaluation, Neurologist in [place], [place] Address: [address], [place], [place] [address] Phone: [phone]  2) please take aspirin 81 mg daily along with Plavix 75 mg daily for the next 4 weeks, after that you will stop the aspirin and take Plavix 75 mg daily indefinitely  3) please take Lipitor 80 mg daily, neurologist may reduce your dose to 40 mg daily down the road  4)  physical therapist will come to your  house to help you with rehab  5)Continue Pureed Diet with Nectar Thickened Liquids   Increase activity slowly   Complete by: As directed         Discharge Medications     Allergies as of 08/27/2019   No Known Allergies     Medication List    TAKE these medications   acetaminophen 325 MG tablet Commonly known as: TYLENOL Take 2 tablets (650 mg total) by mouth every 4 (four) hours as needed for mild pain (or temp > 37.5 C (99.5 F)).   albuterol 108 (90 Base) MCG/ACT inhaler Commonly known as: VENTOLIN HFA Inhale 2 puffs into the lungs every 2 (two) hours as needed for wheezing or shortness of breath (cough).   aspirin 81 MG EC tablet Take 1 tablet (81 mg total) by mouth daily with breakfast. Swallow whole. What changed: when to take this   atorvastatin 80 MG tablet Commonly known as: LIPITOR Take 0.5 tablets (40 mg total) by mouth every evening. What changed:   medication strength  when to take this   budesonide-formoterol 160-4.5 MCG/ACT inhaler Commonly known as: Symbicort Inhale 2 puffs into the lungs 2 (two) times daily.   clopidogrel 75 MG tablet Commonly known as: Plavix Take 1 tablet (75 mg total) by mouth daily.   guaiFENesin 100 MG/5ML Soln Commonly known as: ROBITUSSIN Take 5 mLs (100 mg total) by mouth every 4 (four) hours as needed for cough or to loosen phlegm.   ipratropium-albuterol 0.5-2.5 (3) MG/3ML Soln Commonly known as: DUONEB Take 3 mLs by nebulization every 6 (six) hours as needed. What changed:   when to take this  reasons to take this   mirtazapine 15 MG tablet Commonly known as: REMERON Take 0.5 tablets (7.5 mg total) by mouth at bedtime.   Resource ThickenUp Clear Powd Use as directed to thicken liquids to nectar consistency What changed:   how to take this  when to take this   senna-docusate 8.6-50 MG tablet Commonly known as: Senokot-S Take 2 tablets by mouth at bedtime.      Major procedures and Radiology Reports  - PLEASE review detailed and final reports for all details, in brief -   DG Chest 1 View  Result Date: 08/26/2019 CLINICAL DATA:  Cough and congestion. EXAM: CHEST  1 VIEW COMPARISON:  June 07, 2019 FINDINGS: Mild diffusely increased lung markings are  seen without evidence of acute infiltrate, pleural effusion or pneumothorax. The heart size and mediastinal contours are within normal limits. The visualized skeletal structures are unremarkable. IMPRESSION: No active disease. Electronically Signed   By: Virgina Norfolk M.D.   On: 08/26/2019 19:17   CT Head Wo Contrast  Result Date: 08/26/2019 CLINICAL DATA:  Focal neuro deficit, altered mental status worsening since yesterday EXAM: CT HEAD WITHOUT CONTRAST TECHNIQUE: Contiguous axial images were obtained from the base of the skull through the vertex without intravenous contrast. COMPARISON:  08/11/2015 FINDINGS: Brain: Extensive chronic microvascular ischemic changes worse than on the prior study. Signs of atrophy. No acute intracranial hemorrhage or evidence of mass effect or midline shift. Extensive white matter disease and signs of prior infarct are noted. There is loss of gray-white differentiation along the anterior limb of the internal capsule and right basal ganglia that was not present previously though the prior exam was from 2017. Worsening atrophy. Vascular: No hyperdense vessel or unexpected calcification. Skull: Normal. Negative for fracture or focal lesion. Sinuses/Orbits: Ethmoid sinus disease. Paranasal sinuses are incompletely imaged. Other: None. IMPRESSION: 1. Extensive chronic microvascular ischemic changes, atrophy and signs of prior infarct. 2. Age-indeterminate infarct considered at the anterior limb of right internal capsule and basal ganglia 3. No acute intracranial hemorrhage. 4. Ethmoid sinus disease. Electronically Signed   By: Zetta Bills M.D.   On: 08/26/2019 19:23   MR ANGIO HEAD WO CONTRAST  Result Date:  08/27/2019 CLINICAL DATA:  69 year old male with worsening altered mental status. New incontinence. TIA. Advanced small vessel disease on head CT. EXAM: MRA HEAD WITHOUT CONTRAST TECHNIQUE: Angiographic images of the Circle of Willis were obtained using MRA technique without intravenous contrast. COMPARISON:  Brain MRI today reported separately. Intracranial MRA 08/11/2015 FINDINGS: Stable antegrade flow in the posterior circulation. Codominant distal vertebral arteries. Mild chronic distal right V4 segment irregularity but no significant stenosis. Stable and patent right PICA origin. Stable and patent basilar artery with mild ectasia. PCA origins remain normal. Posterior communicating arteries are diminutive or absent. Bilateral PCA branches are stable. Stable antegrade flow in both ICA siphons. Mild chronic siphon atherosclerosis appears stable since 2017 with no significant ICA stenosis. Patent carotid termini. Dominant left and diminutive right ACA A1 segments again noted. MCA and ACA origins remain within normal limits. Anterior communicating artery and visible ACA branches are stable with mild tortuosity. Median artery of the corpus callosum (normal variant). MCA M1 segments and bifurcations are patent without stenosis. Visible bilateral MCA branches are stable and within normal limits. IMPRESSION: Stable intracranial MRA since 2017 with chronic arterial tortuosity and atherosclerosis but no hemodynamically significant arterial stenosis. Electronically Signed   By: Genevie Ann M.D.   On: 08/27/2019 09:09   MR BRAIN WO CONTRAST  Result Date: 08/27/2019 CLINICAL DATA:  69 year old male with worsening altered mental status. New incontinence. TIA. Advanced small vessel disease on head CT. EXAM: MRI HEAD WITHOUT CONTRAST TECHNIQUE: Multiplanar, multiecho pulse sequences of the brain and surrounding structures were obtained without intravenous contrast. COMPARISON:  Head CT 08/26/2019 and earlier. Brain MRI and  intracranial MRA 08/11/2015. FINDINGS: Brain: Very severe chronic small vessel disease redemonstrated. Extensive chronic lacunar infarcts in the bilateral deep gray nuclei and pons. Scattered chronic microhemorrhages. Confluent bilateral cerebral white matter T2 and FLAIR hyperintensity and multifocal chronic encephalomalacia of the corpus callosum. The cerebellum is least affected. The only area of possible acute restricted diffusion is in the left posterior lentiform or internal capsule on series 3, image  33. This is not well correlated on the coronal DWI. No other restricted diffusion. No midline shift, mass effect, evidence of mass lesion, ventriculomegaly, extra-axial collection or acute intracranial hemorrhage. Cervicomedullary junction and pituitary are within normal limits. Vascular: Major intracranial vascular flow voids are stable since 2017 with chronic intracranial artery dolichoectasia. Skull and upper cervical spine: Negative visible cervical spine. Visualized bone marrow signal is within normal limits. Sinuses/Orbits: Negative orbits. Chronic paranasal sinus disease. Other: Mastoids remain clear. Visible internal auditory structures appear normal. Scalp and face soft tissues appear negative. IMPRESSION: 1. Possible small acute lacunar infarct in the left posterior lentiform or internal capsule. No associated hemorrhage or mass effect. 2. No other acute intracranial abnormality. 3. Otherwise very severe chronic small vessel disease throughout the brain. The cerebellum is least affected. Electronically Signed   By: Genevie Ann M.D.   On: 08/27/2019 09:05   ECHOCARDIOGRAM COMPLETE  Result Date: 08/27/2019   ECHOCARDIOGRAM REPORT   Patient Name:   Jose Schaefer Date of Exam: 08/27/2019 Medical Rec #:  [id number]   Height:       68.0 in Accession #:    [id number]  Weight:       120.0 lb Date of Birth:  1951/01/27    BSA:          1.64 m Patient Age:    40 years    BP:           117/92 mmHg Patient Gender: M            HR:           69 bpm. Exam Location:  [facility] Procedure: 2D Echo Indications:    Stroke 434.91 / I163.9  History:        Patient has prior history of Echocardiogram examinations, most                 recent 08/12/2015. COPD and Stroke; Risk Factors:Current Smoker.                 Cerebral infarction , Cocaine abuse , Acute renal failure.  Sonographer:    Leavy Cella RDCS (AE) Referring Phys: [place]  1. Left ventricular ejection fraction, by visual estimation, is 55 to 60%. The left ventricle has normal function. There is mildly increased left ventricular hypertrophy.  2. Left ventricular diastolic parameters are consistent with Grade I diastolic dysfunction (impaired relaxation).  3. The left ventricle has no regional wall motion abnormalities.  4. Global right ventricle has normal systolic function.The right ventricular size is normal. No increase in right ventricular wall thickness.  5. Left atrial size was normal.  6. Right atrial size was normal.  7. The mitral valve is grossly normal. No evidence of mitral valve regurgitation.  8. The tricuspid valve is not well visualized.  9. The tricuspid valve is not well visualized. Tricuspid valve regurgitation is not demonstrated. 10. The aortic valve is tricuspid. Aortic valve regurgitation is not visualized. No evidence of aortic valve sclerosis or stenosis. 11. The pulmonic valve was grossly normal. Pulmonic valve regurgitation is not visualized. 12. The inferior vena cava is normal in size with greater than 50% respiratory variability, suggesting right atrial pressure of 3 mmHg. 13. The interatrial septum appears to be lipomatous. FINDINGS  Left Ventricle: Left ventricular ejection fraction, by visual estimation, is 55 to 60%. The left ventricle has normal function. The left ventricle has no regional wall motion abnormalities. The left ventricular internal cavity size was  the left ventricle is normal in size. There is mildly  increased left ventricular hypertrophy. Concentric left ventricular hypertrophy. Left ventricular diastolic parameters are consistent with Grade I diastolic dysfunction (impaired relaxation). Indeterminate filling pressures. Right Ventricle: The right ventricular size is normal. No increase in right ventricular wall thickness. Global RV systolic function is has normal systolic function. Left Atrium: Left atrial size was normal in size. Right Atrium: Right atrial size was normal in size Pericardium: There is no evidence of pericardial effusion. Mitral Valve: The mitral valve is grossly normal. No evidence of mitral valve regurgitation. Tricuspid Valve: The tricuspid valve is not well visualized. Tricuspid valve regurgitation is not demonstrated. Aortic Valve: The aortic valve is tricuspid. Aortic valve regurgitation is not visualized. The aortic valve is structurally normal, with no evidence of sclerosis or stenosis. Pulmonic Valve: The pulmonic valve was grossly normal. Pulmonic valve regurgitation is not visualized. Pulmonic regurgitation is not visualized. Aorta: The aortic root is normal in size and structure. Venous: The inferior vena cava is normal in size with greater than 50% respiratory variability, suggesting right atrial pressure of 3 mmHg. IAS/Shunts: Increased thickness of the atrial septum sparing the fossa ovalis consistent with The interatrial septum appears to be lipomatous. No atrial level shunt detected by color flow Doppler.  LEFT VENTRICLE PLAX 2D LVIDd:         2.62 cm Diastology LVIDs:         1.86 cm LV e' lateral:   5.00 cm/s LV PW:         1.23 cm LV E/e' lateral: 12.1 LV IVS:        1.16 cm LV e' medial:    5.11 cm/s LV SV:         15 ml   LV E/e' medial:  11.9 LV SV Index:   9.04  RIGHT VENTRICLE RV S prime:     13.10 cm/s TAPSE (M-mode): 2.3 cm LEFT ATRIUM             Index       RIGHT ATRIUM           Index LA diam:        2.10 cm 1.28 cm/m  RA Area:     10.60 cm LA Vol (A2C):   19.5  ml 11.85 ml/m RA Volume:   23.40 ml  14.23 ml/m LA Vol (A4C):   17.0 ml 10.33 ml/m LA Biplane Vol: 18.6 ml 11.31 ml/m   AORTA Ao Root diam: 3.10 cm MITRAL VALVE MV Area (PHT): 2.05 cm MV PHT:        107.30 msec MV Decel Time: 370 msec MV E velocity: 60.60 cm/s 103 cm/s MV A velocity: 76.60 cm/s 70.3 cm/s MV E/A ratio:  0.79       1.5  Kate Sable MD Electronically signed by Kate Sable MD Signature Date/Time: 08/27/2019/10:53:53 AM    Final    Micro Results   Recent Results (from the past 240 hour(s))  SARS CORONAVIRUS 2 (TAT 6-24 HRS) Nasopharyngeal Nasopharyngeal Swab     Status: None   Collection Time: 08/27/19 12:09 AM   Specimen: Nasopharyngeal Swab  Result Value Ref Range Status   SARS Coronavirus 2 NEGATIVE NEGATIVE Final    Comment: (NOTE) SARS-CoV-2 target nucleic acids are NOT DETECTED. The SARS-CoV-2 RNA is generally detectable in upper and lower respiratory specimens during the acute phase of infection. Negative results do not preclude SARS-CoV-2 infection, do not rule out co-infections with other pathogens, and should  not be used as the sole basis for treatment or other patient management decisions. Negative results must be combined with clinical observations, patient history, and epidemiological information. The expected result is Negative. Fact Sheet for Patients: SugarRoll.be Fact Sheet for Healthcare Providers: https://www.woods-mathews.com/ This test is not yet approved or cleared by the [place] FDA and  has been authorized for detection and/or diagnosis of SARS-CoV-2 by FDA under an Emergency Use Authorization (EUA). This EUA will remain  in effect (meaning this test can be used) for the duration of the COVID-19 declaration under Section 56 4(b)(1) of the Act, 21 U.S.C. section 360bbb-3(b)(1), unless the authorization is terminated or revoked sooner. Performed at [facility], [place] [address],  [place], [place] [address]     Today   Subjective    Jose Schaefer today has no new complaints -States he feels better -No fevers, No chest pains, no shortness of breath -I called and discussed discharge plan with patient's daughter extensively prior to discharge  Patient has been seen and examined prior to discharge   Objective   Blood pressure (!) 132/99, pulse 78, temperature 98.1 F (36.7 C), temperature source Oral, resp. rate 20, height '5\' 8"'$  (1.727 m), weight 54.4 kg, SpO2 94 %.   Intake/Output Summary (Last 24 hours) at 08/27/2019 1343 Last data filed at 08/27/2019 0956 Gross per 24 hour  Intake --  Output 200 ml  Net -200 ml    Exam Gen:- Awake Alert, no acute distress  HEENT:- [place].AT, No sclera icterus Neck-Supple Neck,No JVD,.  Lungs-air movement improved after bronchodilator administration CV- S1, S2 normal, regular Abd-  +ve B.Sounds, Abd Soft, No tenderness,    Extremity/Skin:- No  edema,   good pulses Psych-some forgetfulness, mild cognitive deficits due to underlying dementia  neuro-mild residual left-sided weakness from prior stroke, tremors resolved  Data Review   CBC w Diff:  Lab Results  Component Value Date   WBC 6.2 08/26/2019   HGB 14.2 08/26/2019   HCT 43.9 08/26/2019   PLT 234 08/26/2019   LYMPHOPCT 21 08/26/2019   MONOPCT 7 08/26/2019   EOSPCT 27 08/26/2019   BASOPCT 1 08/26/2019    CMP:  Lab Results  Component Value Date   NA 143 08/26/2019   K 4.1 08/26/2019   CL 105 08/26/2019   CO2 31 08/26/2019   BUN 17 08/26/2019   CREATININE 1.20 08/26/2019   PROT 7.1 08/26/2019   ALBUMIN 3.6 08/26/2019   BILITOT 0.6 08/26/2019   ALKPHOS 68 08/26/2019   AST 17 08/26/2019   ALT 14 08/26/2019  .   Total Discharge time is about 33 minutes  Roxan Hockey M.D on 08/27/2019 at 1:43 PM  Go to [email or web address] -  for contact info  [facility] - Office  [phone]

## 2019-08-27 NOTE — Progress Notes (Signed)
*  PRELIMINARY RESULTS* Echocardiogram 2D Echocardiogram has been performed.  Jose Schaefer 08/27/2019, 10:39 AM

## 2019-08-27 NOTE — Discharge Instructions (Signed)
1) please follow-up with neurologist Dr. Beryle Beams in 3 to 4 weeks for recheck and reevaluation, Neurologist in [place], [place] Address: [address], [place], [place] [address] Phone: [phone]  2) please take aspirin 81 mg daily along with Plavix 75 mg daily for the next 4 weeks, after that you will stop the aspirin and take Plavix 75 mg daily indefinitely  3) please take Lipitor 80 mg daily, neurologist may reduce your dose to 40 mg daily down the road  4)  physical therapist will come to your house to help you with rehab  5)Continue Pureed Diet with Nectar Thickened Liquids

## 2019-08-27 NOTE — Plan of Care (Signed)
  Problem: Acute Rehab PT Goals(only PT should resolve) Goal: Patient Will Transfer Sit To/From Stand Outcome: Progressing Flowsheets (Taken 08/27/2019 1051) Patient will transfer sit to/from stand: with modified independence Goal: Pt Will Transfer Bed To Chair/Chair To Bed Outcome: Progressing Flowsheets (Taken 08/27/2019 1051) Pt will Transfer Bed to Chair/Chair to Bed: with modified independence Goal: Pt Will Ambulate Outcome: Progressing Flowsheets (Taken 08/27/2019 1051) Pt will Ambulate:  25 feet  with min guard assist  10:52 AM, 08/27/19 Wyman Songster PT, DPT Physical Therapist at [facility]

## 2019-08-27 NOTE — H&P (Signed)
History and Physical    Patient Demographics:    Jose Schaefer ZOX:[id number] DOB: [birth year] DOA: 08/26/2019  PCP: [facility]  Patient coming from: Home  I have personally briefly reviewed patient's old medical records in [facility]  Chief Complaint: Altered Mental Status   Assessment & Plan:     Assessment/Plan Principal Problem:   AMS (altered mental status) Active Problems:   Asthma   COPD (chronic obstructive pulmonary disease) with acute bronchitis (HCC)   Cerebral infarction (HCC)   Dysphagia   Dementia (HCC)     Principal Problem: Altered Mental Status possibly secondary to acute CVA Patient presented with increased confusion, poor p.o. intake, decreased ambulation, increased weakness.  Has a history of previous CVA with mild left-sided residual weakness as well as early dementia as per family.  At baseline is able to ambulate and perform his ADLs.  Significant decline over the last 48 hours.  Noted to have left-sided weakness on my exam which is apparently at baseline.  No obvious focal deficits.  Noted to be confused.  CT of the head shows age-indeterminate infarct in the anterior limb of the right internal capsule and basal ganglia.  Because of increased confusion is unclear but concern for recent CVA given head CT findings.  Differential diagnosis possibly also include metabolic encephalopathy although no obvious trigger.  Patient does not have any signs of infectious etiology with normal chest x-ray as well as urinalysis.  No white count, fever noted.  Normal lactic acid.  ABG shows no evidence of hypercapnia.  Renal function appears to be at baseline. -Telemetry monitoring -PT/OT/speech evaluation -Keep n.p.o. for now -Neurochecks every 4 hours -MRI brain, echocardiogram in a.m. -Neurology consult in a.m. -Fall, aspiration precautions  Other Active Problems: Chronic dysphagia Patient was previously on dysphagia diet with nectar thick liquids at  home. Prior history of esophageal stricture.  Had EGD in March 2020 with dilation. -Aspiration precautions -We will keep n.p.o. for now.  Repeat swallow evaluation.   History of CVA with mild residual left-sided weakness -Continue aspirin  Possible dementia Patient has mild confusion at home at baseline as per family.  Hyperlipidemia -Continue Lipitor  History of COPD: Not in acute exacerbation -Continue albuterol as needed  DVT prophylaxis: Lovenox Code Status:  Full code Family Communication: N/A  Disposition Plan: Admitted for workup for altered mental status   Consults called: N/A Admission status: Observation status    HPI:     HPI: Jose Schaefer is a 69 y.o. male with medical history significant of CVA, COPD, hyperlipidemia who presented to the ER with altered mental status.  History is obtained from the ER record and the description from the family as the patient is confused and a poor historian and unable to provide history.  Patient lives with his daughter and she noted that he was more confused over the last couple of days and had decreased p.o. intake as well as difficulty with performing his normal ADLs.  He was also noted to have some urinary incontinence.  At baseline patient is a little bit confused however he seemed to decline considerably over the last couple of days.  Daughter was concerned that he had a UTI and brought him in for further evaluation.  He had not had any fevers, chills, cough, shortness of breath, vomiting, diarrhea.  Has had a history of CVA in the past and has some residual left-sided weakness. ED Course:  Vital Signs reviewed on presentation, significant for temperature 97.7, HR  67, BP 117/86, saturation 100% on room air.  Labs reviewed, significant for ABG shows pH 7.40, PCO2 45, PO2 91.  Sodium 143, potassium 4.1, BUN 17, creatinine 1.2, LFTs within normal limits, troponin negative x2, lactic acid 1.6.  WBC count 6.2, hemoglobin 14.2, hematocrit  40, platelets 234, urinalysis is negative, urine drug screen is negative. Imaging personally Reviewed, chest x-ray shows no acute cardiopulmonary disease.  CT of the head shows extensive chronic microvascular ischemic changes.  There is atrophy and signs of a prior infarction.  There is an age-indeterminate infarct considered of the anterior limb of the right internal capsule and basal ganglia. EKG personally reviewed, shows sinus rhythm, multiple PVCs.    Review of systems:    Review of Systems: As per HPI otherwise 10 point review of systems negative.  All other review of systems is negative except the ones noted above in the HPI.    Past Medical and Surgical History:  Reviewed by me  Past Medical History:  Diagnosis Date  . Asthma   . COPD (chronic obstructive pulmonary disease) (HCC)   . CVA (cerebral vascular accident) [facility]) 2001   [place]  . Dementia (HCC)   . Tobacco abuse     Past Surgical History:  Procedure Laterality Date  . ESOPHAGEAL DILATION N/A 10/13/2018   Procedure: ESOPHAGEAL DILATION;  Surgeon: West Bali, MD;  [facility];  Service: Endoscopy;  Laterality: N/A;  . ESOPHAGOGASTRODUODENOSCOPY N/A 10/13/2018   Procedure: ESOPHAGOGASTRODUODENOSCOPY (EGD);  Surgeon: West Bali, MD;  [facility];  Service: Endoscopy;  Laterality: N/A;     Social History:  Reviewed by me   reports that he has been smoking cigarettes. He has a 20.00 pack-year smoking history. He has never used smokeless tobacco. He reports previous alcohol use of about 14.0 standard drinks of alcohol per week. He reports previous drug use. Drugs: Cocaine and Marijuana.  Allergies:    No Known Allergies  Family History :   History reviewed. No pertinent family history. Family history reviewed, noted as above, not pertinent to current presentation.   Home Medications:    Prior to Admission medications   Medication Sig Start Date End Date Taking? Authorizing  Provider  albuterol (PROVENTIL HFA;VENTOLIN HFA) 108 (90 Base) MCG/ACT inhaler Inhale 2 puffs into the lungs every 2 (two) hours as needed for wheezing or shortness of breath (cough). 01/04/17  Yes Pollina, Canary Brim, MD  aspirin 81 MG EC tablet Take 81 mg by mouth daily. Swallow whole.   Yes [provider]  atorvastatin (LIPITOR) 40 MG tablet Take 40 mg by mouth daily.   Yes [provider]  ipratropium-albuterol (DUONEB) 0.5-2.5 (3) MG/3ML SOLN Take 3 mLs by nebulization in the morning, at noon, in the evening, and at bedtime.   Yes [provider]  Maltodextrin-Xanthan Gum (RESOURCE THICKENUP CLEAR) POWD Use as directed to thicken liquids to nectar consistency Patient taking differently: Take by mouth 3 (three) times daily with meals. Use as directed to thicken liquids to nectar consistency 10/13/18  Yes Johnson, Clanford L, MD  mirtazapine (REMERON) 15 MG tablet Take 7.5 mg by mouth at bedtime.   Yes [provider]    Physical Exam:    Physical Exam: Vitals:   08/26/19 2315 08/26/19 2330 08/26/19 2345 08/27/19 0000  BP:  132/83  117/86  Pulse: (!) 52 67    Resp: (!) 24 (!) 21 11 12   Temp:      TempSrc:  SpO2: 98% 100%    Weight:      Height:        Constitutional: NAD, calm, comfortable Vitals:   08/26/19 2315 08/26/19 2330 08/26/19 2345 08/27/19 0000  BP:  132/83  117/86  Pulse: (!) 52 67    Resp: (!) 24 (!) 21 11 12   Temp:      TempSrc:      SpO2: 98% 100%    Weight:      Height:       Eyes: PERRL, lids and conjunctivae normal ENMT: Mucous membranes are moist.  Neck: normal, supple, no masses, no thyromegaly Respiratory: clear to auscultation bilaterally, no wheezing, no crackles. Normal respiratory effort. No accessory muscle use.  Cardiovascular: Regular rate and rhythm, no murmurs / rubs / gallops. No extremity edema. 2+ pedal pulses. No carotid bruits.  Abdomen: no tenderness, no masses palpated. No hepatosplenomegaly.  Bowel sounds positive.  Musculoskeletal: no clubbing / cyanosis. No joint deformity upper and lower extremities. Good ROM, no contractures. Normal muscle tone.  Skin: no rashes, lesions, ulcers. No induration Neurologic: CN 2-12 grossly intact. Sensation intact, DTR normal.  Strength appears to be 4/5 in right upper and right lower extremity and 3/5 in left upper and left lower extremity.  Overall generally weak but slightly more weakness on the left. Psychiatric: Poor thought content and judgment.  Patient is confused, unable to state the year the month of the president.  Is able to state that he is in the hospital.  Can tell me his name.   Decubitus Ulcers: Not present on admission Catheters and tubes: None  Data Review:    Labs on Admission: I have personally reviewed following labs and imaging studies  CBC: Recent Labs  Lab 08/26/19 1708  WBC 6.2  NEUTROABS 2.7  HGB 14.2  HCT 43.9  MCV 88.9  PLT 234   Basic Metabolic Panel: Recent Labs  Lab 08/26/19 1708  NA 143  K 4.1  CL 105  CO2 31  GLUCOSE 91  BUN 17  CREATININE 1.20  CALCIUM 9.1   GFR: Estimated Creatinine Clearance: 44.7 mL/min (by C-G formula based on SCr of 1.2 mg/dL). Liver Function Tests: Recent Labs  Lab 08/26/19 1708  AST 17  ALT 14  ALKPHOS 68  BILITOT 0.6  PROT 7.1  ALBUMIN 3.6   No results for input(s): LIPASE, AMYLASE in the last 168 hours. No results for input(s): AMMONIA in the last 168 hours. Coagulation Profile: No results for input(s): INR, PROTIME in the last 168 hours. Cardiac Enzymes: No results for input(s): CKTOTAL, CKMB, CKMBINDEX, TROPONINI in the last 168 hours. BNP (last 3 results) No results for input(s): PROBNP in the last 8760 hours. HbA1C: No results for input(s): HGBA1C in the last 72 hours. CBG: Recent Labs  Lab 08/26/19 1813 08/26/19 1835  GLUCAP 63* 141*   Lipid Profile: No results for input(s): CHOL, HDL, LDLCALC, TRIG, CHOLHDL, LDLDIRECT in the last 72  hours. Thyroid Function Tests: No results for input(s): TSH, T4TOTAL, FREET4, T3FREE, THYROIDAB in the last 72 hours. Anemia Panel: No results for input(s): VITAMINB12, FOLATE, FERRITIN, TIBC, IRON, RETICCTPCT in the last 72 hours. Urine analysis:    Component Value Date/Time   COLORURINE STRAW (A) 08/26/2019 2038   APPEARANCEUR CLEAR 08/26/2019 2038   LABSPEC 1.009 08/26/2019 2038   PHURINE 7.0 08/26/2019 2038   GLUCOSEU 150 (A) 08/26/2019 2038   HGBUR NEGATIVE 08/26/2019 2038   BILIRUBINUR NEGATIVE 08/26/2019 2038   KETONESUR NEGATIVE 08/26/2019 2038  PROTEINUR NEGATIVE 08/26/2019 2038   UROBILINOGEN 0.2 06/26/2011 0606   NITRITE NEGATIVE 08/26/2019 2038   LEUKOCYTESUR NEGATIVE 08/26/2019 2038     Imaging Results:      Radiological Exams on Admission: DG Chest 1 View  Result Date: 08/26/2019 CLINICAL DATA:  Cough and congestion. EXAM: CHEST  1 VIEW COMPARISON:  June 07, 2019 FINDINGS: Mild diffusely increased lung markings are seen without evidence of acute infiltrate, pleural effusion or pneumothorax. The heart size and mediastinal contours are within normal limits. The visualized skeletal structures are unremarkable. IMPRESSION: No active disease. Electronically Signed   By: Aram Candela M.D.   On: 08/26/2019 19:17   CT Head Wo Contrast  Result Date: 08/26/2019 CLINICAL DATA:  Focal neuro deficit, altered mental status worsening since yesterday EXAM: CT HEAD WITHOUT CONTRAST TECHNIQUE: Contiguous axial images were obtained from the base of the skull through the vertex without intravenous contrast. COMPARISON:  08/11/2015 FINDINGS: Brain: Extensive chronic microvascular ischemic changes worse than on the prior study. Signs of atrophy. No acute intracranial hemorrhage or evidence of mass effect or midline shift. Extensive white matter disease and signs of prior infarct are noted. There is loss of gray-white differentiation along the anterior limb of the internal capsule and  right basal ganglia that was not present previously though the prior exam was from 2017. Worsening atrophy. Vascular: No hyperdense vessel or unexpected calcification. Skull: Normal. Negative for fracture or focal lesion. Sinuses/Orbits: Ethmoid sinus disease. Paranasal sinuses are incompletely imaged. Other: None. IMPRESSION: 1. Extensive chronic microvascular ischemic changes, atrophy and signs of prior infarct. 2. Age-indeterminate infarct considered at the anterior limb of right internal capsule and basal ganglia 3. No acute intracranial hemorrhage. 4. Ethmoid sinus disease. Electronically Signed   By: Donzetta Kohut M.D.   On: 08/26/2019 19:23      Sheretha Shadd Cyndie Chime MD [facility]  If 7PM-7AM, please contact night-coverage   08/27/2019, 12:29 AM

## 2019-11-21 ENCOUNTER — Encounter (HOSPITAL_COMMUNITY): Payer: Self-pay

## 2019-11-21 ENCOUNTER — Emergency Department (HOSPITAL_COMMUNITY)
Admission: EM | Admit: 2019-11-21 | Discharge: 2019-11-22 | Disposition: A | Discharge status: Home / Self Care | Payer: Medicare Other | Attending: Emergency Medicine | Admitting: Emergency Medicine

## 2019-11-21 ENCOUNTER — Emergency Department (HOSPITAL_COMMUNITY): Payer: Medicare Other

## 2019-11-21 ENCOUNTER — Other Ambulatory Visit: Payer: Self-pay

## 2019-11-21 DIAGNOSIS — F17210 Nicotine dependence, cigarettes, uncomplicated: Secondary | ICD-10-CM | POA: Insufficient documentation

## 2019-11-21 DIAGNOSIS — J449 Chronic obstructive pulmonary disease, unspecified: Secondary | ICD-10-CM | POA: Insufficient documentation

## 2019-11-21 DIAGNOSIS — Z7982 Long term (current) use of aspirin: Secondary | ICD-10-CM | POA: Diagnosis not present

## 2019-11-21 DIAGNOSIS — Z79899 Other long term (current) drug therapy: Secondary | ICD-10-CM | POA: Diagnosis not present

## 2019-11-21 DIAGNOSIS — R4182 Altered mental status, unspecified: Secondary | ICD-10-CM | POA: Insufficient documentation

## 2019-11-21 DIAGNOSIS — Z8673 Personal history of transient ischemic attack (TIA), and cerebral infarction without residual deficits: Secondary | ICD-10-CM | POA: Insufficient documentation

## 2019-11-21 DIAGNOSIS — F0390 Unspecified dementia without behavioral disturbance: Secondary | ICD-10-CM | POA: Diagnosis not present

## 2019-11-21 LAB — CBC
HCT: 43.6 % (ref 39.0–52.0)
Hemoglobin: 14.2 g/dL (ref 13.0–17.0)
MCH: 27.9 pg (ref 26.0–34.0)
MCHC: 32.6 g/dL (ref 30.0–36.0)
MCV: 85.7 fL (ref 80.0–100.0)
Platelets: 335 10*3/uL (ref 150–400)
RBC: 5.09 MIL/uL (ref 4.22–5.81)
RDW: 13.8 % (ref 11.5–15.5)
WBC: 7.1 10*3/uL (ref 4.0–10.5)
nRBC: 0 % (ref 0.0–0.2)

## 2019-11-21 LAB — COMPREHENSIVE METABOLIC PANEL
ALT: 30 U/L (ref 0–44)
AST: 32 U/L (ref 15–41)
Albumin: 3.8 g/dL (ref 3.5–5.0)
Alkaline Phosphatase: 74 U/L (ref 38–126)
Anion gap: 9 (ref 5–15)
BUN: 17 mg/dL (ref 8–23)
CO2: 27 mmol/L (ref 22–32)
Calcium: 9.1 mg/dL (ref 8.9–10.3)
Chloride: 100 mmol/L (ref 98–111)
Creatinine, Ser: 1.34 mg/dL — ABNORMAL HIGH (ref 0.61–1.24)
GFR calc Af Amer: >60 mL/min (ref >60)
GFR calc non Af Amer: 54 mL/min — ABNORMAL LOW (ref >60)
Glucose, Bld: 114 mg/dL — ABNORMAL HIGH (ref 70–99)
Potassium: 4.1 mmol/L (ref 3.5–5.1)
Sodium: 136 mmol/L (ref 135–145)
Total Bilirubin: 0.5 mg/dL (ref 0.3–1.2)
Total Protein: 8.1 g/dL (ref 6.5–8.1)

## 2019-11-21 NOTE — ED Notes (Signed)
Patient found wandering the hall having taken all EKG leads off and looking for the bathroom.

## 2019-11-21 NOTE — ED Triage Notes (Signed)
Pt is from [facility] [address]), sent for ?/altered mental status since yesterday.  Pt denies complaints, states he did not want to come to e.r. but staff insisted. Pt is ambulatory from ems stretcher to e.r. bed without diff.

## 2019-11-21 NOTE — ED Provider Notes (Signed)
[facility] EMERGENCY DEPARTMENT Provider Note   CSN: [id number] Arrival date & time: 11/21/19  2052     History Chief Complaint  Patient presents with  . Altered Mental Status   Level 5 caveat due to altered mental status/dementia Jose Schaefer is a 69 y.o. male.  The history is provided by the patient.  Altered Mental Status Presenting symptoms: confusion   Severity:  Moderate Timing:  Constant Progression:  Unchanged Chronicity:  New Patient history of COPD, previous stroke, dementia presents from nursing facility for confusion Patient presents for reportedly been altered since yesterday. There are very few details this time.  There was no report given from the nursing facility.  Patient denies any complaints and is walking around the ER.    Past Medical History:  Diagnosis Date  . Asthma   . COPD (chronic obstructive pulmonary disease) (HCC)   . CVA (cerebral vascular accident) [facility]) 2001   [place]  . Dementia (HCC)   . Tobacco abuse     Patient Active Problem List   Diagnosis Date Noted  . Dementia (HCC) 08/27/2019  . AMS (altered mental status) 08/27/2019  . Altered mental status/Metabolic Encephalopathy due to Acute CVA 08/27/2019  . Aspiration pneumonia of right lung (HCC)   . Esophagitis   . Abnormal CT scan, esophagus   . Malnutrition of moderate degree 10/10/2018  . COPD exacerbation (HCC) 10/09/2018  . Dysphagia 10/09/2018  . Aspiration into airway 10/09/2018  . Lactic acidosis 10/09/2018  . Cerebral infarction due to unspecified mechanism   . CVA/-Small acute lacunar infarct in the left posterior lentiform/Internal Capsule 08/11/2015  . Cocaine abuse (HCC) 08/11/2015  . ARF (acute renal failure) (HCC) 08/11/2015  . Acute respiratory failure (HCC) 06/26/2011  . COPD (chronic obstructive pulmonary disease) with acute bronchitis (HCC) 06/26/2011  . Tobacco abuse   . Asthma     Past Surgical History:  Procedure Laterality Date  . ESOPHAGEAL  DILATION N/A 10/13/2018   Procedure: ESOPHAGEAL DILATION;  Surgeon: West Bali, MD;  [facility];  Service: Endoscopy;  Laterality: N/A;  . ESOPHAGOGASTRODUODENOSCOPY N/A 10/13/2018   Procedure: ESOPHAGOGASTRODUODENOSCOPY (EGD);  Surgeon: West Bali, MD;  [facility];  Service: Endoscopy;  Laterality: N/A;       No family history on file.  Social History   Tobacco Use  . Smoking status: Current Every Day Smoker    Packs/day: 0.50    Years: 40.00    Pack years: 20.00    Types: Cigarettes  . Smokeless tobacco: Never Used  Substance Use Topics  . Alcohol use: Not Currently    Alcohol/week: 14.0 standard drinks    Types: 4 Cans of beer, 10 Shots of liquor per week    Comment: as much as i can get, not current per daughter  . Drug use: Not Currently    Types: Cocaine, Marijuana    Home Medications Prior to Admission medications   Medication Sig Start Date End Date Taking? Authorizing Provider  acetaminophen (TYLENOL) 325 MG tablet Take 2 tablets (650 mg total) by mouth every 4 (four) hours as needed for mild pain (or temp > 37.5 C (99.5 F)). 08/27/19   Shon Hale, MD  albuterol (VENTOLIN HFA) 108 (90 Base) MCG/ACT inhaler Inhale 2 puffs into the lungs every 2 (two) hours as needed for wheezing or shortness of breath (cough). 08/27/19   Shon Hale, MD  aspirin 81 MG EC tablet Take 1 tablet (81 mg total) by mouth daily with  breakfast. Swallow whole. 08/27/19   Roxan Hockey, MD  atorvastatin (LIPITOR) 80 MG tablet Take 0.5 tablets (40 mg total) by mouth every evening. 08/27/19   Roxan Hockey, MD  budesonide-formoterol (SYMBICORT) 160-4.5 MCG/ACT inhaler Inhale 2 puffs into the lungs 2 (two) times daily. 08/27/19   Roxan Hockey, MD  clopidogrel (PLAVIX) 75 MG tablet Take 1 tablet (75 mg total) by mouth daily. 08/27/19 08/26/20  Roxan Hockey, MD  guaiFENesin (ROBITUSSIN) 100 MG/5ML SOLN Take 5 mLs (100 mg total) by mouth every 4 (four) hours as  needed for cough or to loosen phlegm. 08/27/19   Roxan Hockey, MD  ipratropium-albuterol (DUONEB) 0.5-2.5 (3) MG/3ML SOLN Take 3 mLs by nebulization every 6 (six) hours as needed. 08/27/19   Roxan Hockey, MD  Maltodextrin-Xanthan Gum ([place]) POWD Use as directed to thicken liquids to nectar consistency Patient taking differently: Take by mouth 3 (three) times daily with meals. Use as directed to thicken liquids to nectar consistency 10/13/18   Johnson, Clanford L, MD  mirtazapine (REMERON) 15 MG tablet Take 0.5 tablets (7.5 mg total) by mouth at bedtime. 08/27/19   Roxan Hockey, MD  senna-docusate (SENOKOT-S) 8.6-50 MG tablet Take 2 tablets by mouth at bedtime. 08/27/19   Roxan Hockey, MD    Allergies    Patient has no known allergies.  Review of Systems   Review of Systems  Unable to perform ROS: Dementia  Psychiatric/Behavioral: Positive for confusion.    Physical Exam Updated Vital Signs BP (!) 142/99   Pulse 99   Temp 99 F (37.2 C) (Oral)   Resp 19   Ht 1.727 m (5\' 8" )   Wt 59 kg   SpO2 93%   BMI 19.77 kg/m   Physical Exam CONSTITUTIONAL: Elderly frail, no acute distress HEAD: Normocephalic/atraumatic EYES: EOMI/PERRL ENMT: Mucous membranes moist NECK: supple no meningeal signs SPINE/BACK:entire spine nontender CV: S1/S2 noted, no murmurs/rubs/gallops noted LUNGS: Lungs are clear to auscultation bilaterally, no apparent distress ABDOMEN: soft, nontender, no rebound or guarding, bowel sounds noted throughout abdomen GU:no cva tenderness NEURO: Pt is awake/alert,moves all extremitiesx4.  No facial droop.  Patient is able to ambulate without difficulty.  His speech is very difficult to understand, but he does appear to answer most questions appropriately.  He is able to follow simple commands EXTREMITIES: pulses normal/equal, full ROM SKIN: warm, color normal  ED Results / Procedures / Treatments   Labs (all labs ordered are listed, but only  abnormal results are displayed) Labs Reviewed  COMPREHENSIVE METABOLIC PANEL - Abnormal; Notable for the following components:      Result Value   Glucose, Bld 114 (*)    Creatinine, Ser 1.34 (*)    GFR calc non Af Amer 54 (*)    All other components within normal limits  CBC  URINALYSIS, ROUTINE W REFLEX MICROSCOPIC    EKG EKG Interpretation  Date/Time:  Thursday November 21 2019 21:25:21 EDT Ventricular Rate:  102 PR Interval:    QRS Duration: 72 QT Interval:  361 QTC Calculation: 471 R Axis:   41 Text Interpretation: Sinus tachycardia Ventricular premature complex Probable anteroseptal infarct, old Confirmed by Ripley Fraise [phone]) on 11/21/2019 11:30:21 PM   Radiology No results found.  Procedures Procedures Medications Ordered in ED Medications - No data to display  ED Course  I have reviewed the triage vital signs and the nursing notes.  Pertinent labs & imaging results that were available during my care of the patient were reviewed by me and  considered in my medical decision making (see chart for details).    MDM Rules/Calculators/A&P                      11:52 PM Patient presents for reportedly having altered mental status.  However there is no report from the facility.  He is awake and alert and walking around the room.  His speech is difficult to understand, the patient has had a stroke previously.  My suspicion is that this is not acute We will attempt to check urinalysis, chest x-ray and will try to contact facility 2:03 AM CT head negative.  No signs of UTI.  Patient is awake/alert.  Discussed with his daughter Alcario Drought via phone.  She reports there was some changes in his behavior and she was concerned about a UTI.  She was informed of all findings here tonight.  At this point I feel he is appropriate for discharge back to facility.  She is agreeable with plan She confirmed that his speech is at baseline   This patient presents to the ED for concern of  altered mental status, this involves an extensive number of treatment options, and is a complaint that carries with it a high risk of complications and morbidity.  The differential diagnosis includes stroke, UTI, pneumonia, meningitis, electrolyte abnormality   Lab Tests:   I Ordered, reviewed, and interpreted labs, which included urinalysis, electrolytes, complete blood count    Imaging Studies ordered:   I ordered imaging studies which included chest xray/ CT head   I independently visualized and interpreted imaging which showed no acute findings     Reevaluation:  After the interventions stated above, I reevaluated the patient and found patient is stable      Final Clinical Impression(s) / ED Diagnoses Final diagnoses:  Altered mental status, unspecified altered mental status type    Rx / DC Orders ED Discharge Orders    None       Zadie Rhine, MD 11/22/19 [phone]

## 2019-11-22 ENCOUNTER — Emergency Department (HOSPITAL_COMMUNITY): Payer: Medicare Other

## 2019-11-22 LAB — URINALYSIS, ROUTINE W REFLEX MICROSCOPIC
Bilirubin Urine: NEGATIVE
Glucose, UA: NEGATIVE mg/dL
Hgb urine dipstick: NEGATIVE
Ketones, ur: NEGATIVE mg/dL
Leukocytes,Ua: NEGATIVE
Nitrite: NEGATIVE
Protein, ur: NEGATIVE mg/dL
Specific Gravity, Urine: 1.009 (ref 1.005–1.030)
pH: 8 (ref 5.0–8.0)

## 2019-11-22 NOTE — ED Notes (Signed)
Discharge papers sent with EMS to give facility staff at [facility].

## 2019-11-22 NOTE — ED Notes (Signed)
Attempted to call Alcario Drought, no answer at this time- EMS called for pt transport back to facility.

## 2019-11-22 NOTE — ED Notes (Cosign Needed Addendum)
spoke with pt daughter and owner of [facility],  Larkin Ina) - reports pt has been "unsteady on feet today, more than normal, residual deficit of right leg from previous CVA per daughter. Daughter says pt did not "know who she was earlier today." Reports pt does have hx of dementia and she didn't know if it was getting worse, but says he normally knows who she is and wants to make sure he didn't have another stroke. Alcario Drought says pt speech is at baseline (slurred and hard to understand). REports pt is on nectar thick liquids. Pt asking to speak with his daughter and states his daughter's name is Alcario Drought. Pt also states he lives off of [address] (daughter says this is correct). Informed daughter DR Bebe Shaggy will be informed of this information. Alcario Drought reports she is about to take her night meds and go to bed, so she cannot come pick up pt if discharged; however Says it will be fine for EMS to take him back to facility and okay for this nurse to give discharge paperwork to Johnny Bridge, nurse aid at [facility] when or if pt is discharged. This nurse walked with pt to bathroom and assisted pt with providing urine sample, pt then redirected back to room- pt currently lying in bed watching TV. Pt content at this time. Dr Bebe Shaggy aware. Pt able to ambulate with standby assist. Noted that pt does drag right leg some when ambulating.

## 2020-05-23 ENCOUNTER — Inpatient Hospital Stay (HOSPITAL_COMMUNITY)
Admission: EM | Admit: 2020-05-23 | Discharge: 2020-06-04 | DRG: 871 | Disposition: A | Discharge status: Hospice | Payer: Medicare Other | Attending: Family Medicine | Admitting: Family Medicine

## 2020-05-23 ENCOUNTER — Emergency Department (HOSPITAL_COMMUNITY): Payer: Medicare Other

## 2020-05-23 ENCOUNTER — Other Ambulatory Visit: Payer: Self-pay

## 2020-05-23 ENCOUNTER — Inpatient Hospital Stay (HOSPITAL_COMMUNITY): Payer: Medicare Other

## 2020-05-23 ENCOUNTER — Encounter (HOSPITAL_COMMUNITY): Payer: Self-pay

## 2020-05-23 DIAGNOSIS — Z20822 Contact with and (suspected) exposure to covid-19: Secondary | ICD-10-CM | POA: Diagnosis present

## 2020-05-23 DIAGNOSIS — A419 Sepsis, unspecified organism: Secondary | ICD-10-CM | POA: Diagnosis present

## 2020-05-23 DIAGNOSIS — L0390 Cellulitis, unspecified: Secondary | ICD-10-CM | POA: Diagnosis not present

## 2020-05-23 DIAGNOSIS — Z8673 Personal history of transient ischemic attack (TIA), and cerebral infarction without residual deficits: Secondary | ICD-10-CM | POA: Diagnosis not present

## 2020-05-23 DIAGNOSIS — R29723 NIHSS score 23: Secondary | ICD-10-CM | POA: Diagnosis present

## 2020-05-23 DIAGNOSIS — N390 Urinary tract infection, site not specified: Secondary | ICD-10-CM | POA: Diagnosis present

## 2020-05-23 DIAGNOSIS — J9601 Acute respiratory failure with hypoxia: Secondary | ICD-10-CM | POA: Diagnosis not present

## 2020-05-23 DIAGNOSIS — J189 Pneumonia, unspecified organism: Secondary | ICD-10-CM | POA: Diagnosis not present

## 2020-05-23 DIAGNOSIS — E43 Unspecified severe protein-calorie malnutrition: Secondary | ICD-10-CM | POA: Diagnosis present

## 2020-05-23 DIAGNOSIS — E860 Dehydration: Secondary | ICD-10-CM | POA: Diagnosis present

## 2020-05-23 DIAGNOSIS — Z7189 Other specified counseling: Secondary | ICD-10-CM | POA: Diagnosis not present

## 2020-05-23 DIAGNOSIS — I70245 Atherosclerosis of native arteries of left leg with ulceration of other part of foot: Secondary | ICD-10-CM | POA: Diagnosis not present

## 2020-05-23 DIAGNOSIS — M6282 Rhabdomyolysis: Secondary | ICD-10-CM | POA: Diagnosis present

## 2020-05-23 DIAGNOSIS — F0390 Unspecified dementia without behavioral disturbance: Secondary | ICD-10-CM | POA: Diagnosis present

## 2020-05-23 DIAGNOSIS — I639 Cerebral infarction, unspecified: Secondary | ICD-10-CM | POA: Diagnosis present

## 2020-05-23 DIAGNOSIS — J440 Chronic obstructive pulmonary disease with acute lower respiratory infection: Secondary | ICD-10-CM | POA: Diagnosis present

## 2020-05-23 DIAGNOSIS — Z781 Physical restraint status: Secondary | ICD-10-CM

## 2020-05-23 DIAGNOSIS — I998 Other disorder of circulatory system: Secondary | ICD-10-CM | POA: Diagnosis present

## 2020-05-23 DIAGNOSIS — R9431 Abnormal electrocardiogram [ECG] [EKG]: Secondary | ICD-10-CM | POA: Diagnosis not present

## 2020-05-23 DIAGNOSIS — E870 Hyperosmolality and hypernatremia: Secondary | ICD-10-CM | POA: Diagnosis present

## 2020-05-23 DIAGNOSIS — Z681 Body mass index (BMI) 19 or less, adult: Secondary | ICD-10-CM | POA: Diagnosis not present

## 2020-05-23 DIAGNOSIS — Z7951 Long term (current) use of inhaled steroids: Secondary | ICD-10-CM

## 2020-05-23 DIAGNOSIS — Z4659 Encounter for fitting and adjustment of other gastrointestinal appliance and device: Secondary | ICD-10-CM

## 2020-05-23 DIAGNOSIS — Z66 Do not resuscitate: Secondary | ICD-10-CM | POA: Diagnosis not present

## 2020-05-23 DIAGNOSIS — R630 Anorexia: Secondary | ICD-10-CM | POA: Diagnosis present

## 2020-05-23 DIAGNOSIS — Z0189 Encounter for other specified special examinations: Secondary | ICD-10-CM

## 2020-05-23 DIAGNOSIS — E876 Hypokalemia: Secondary | ICD-10-CM | POA: Diagnosis not present

## 2020-05-23 DIAGNOSIS — R1310 Dysphagia, unspecified: Secondary | ICD-10-CM | POA: Diagnosis present

## 2020-05-23 DIAGNOSIS — G9341 Metabolic encephalopathy: Secondary | ICD-10-CM | POA: Diagnosis present

## 2020-05-23 DIAGNOSIS — Z515 Encounter for palliative care: Secondary | ICD-10-CM

## 2020-05-23 DIAGNOSIS — I70235 Atherosclerosis of native arteries of right leg with ulceration of other part of foot: Secondary | ICD-10-CM | POA: Diagnosis not present

## 2020-05-23 DIAGNOSIS — F17210 Nicotine dependence, cigarettes, uncomplicated: Secondary | ICD-10-CM | POA: Diagnosis present

## 2020-05-23 DIAGNOSIS — R29810 Facial weakness: Secondary | ICD-10-CM | POA: Diagnosis present

## 2020-05-23 DIAGNOSIS — I739 Peripheral vascular disease, unspecified: Secondary | ICD-10-CM | POA: Diagnosis present

## 2020-05-23 DIAGNOSIS — Z79899 Other long term (current) drug therapy: Secondary | ICD-10-CM

## 2020-05-23 DIAGNOSIS — Z7982 Long term (current) use of aspirin: Secondary | ICD-10-CM

## 2020-05-23 DIAGNOSIS — L97909 Non-pressure chronic ulcer of unspecified part of unspecified lower leg with unspecified severity: Secondary | ICD-10-CM | POA: Diagnosis not present

## 2020-05-23 DIAGNOSIS — N179 Acute kidney failure, unspecified: Secondary | ICD-10-CM | POA: Diagnosis present

## 2020-05-23 DIAGNOSIS — J449 Chronic obstructive pulmonary disease, unspecified: Secondary | ICD-10-CM | POA: Diagnosis not present

## 2020-05-23 DIAGNOSIS — D696 Thrombocytopenia, unspecified: Secondary | ICD-10-CM | POA: Diagnosis present

## 2020-05-23 DIAGNOSIS — I351 Nonrheumatic aortic (valve) insufficiency: Secondary | ICD-10-CM | POA: Diagnosis not present

## 2020-05-23 DIAGNOSIS — R531 Weakness: Secondary | ICD-10-CM | POA: Diagnosis not present

## 2020-05-23 DIAGNOSIS — R059 Cough, unspecified: Secondary | ICD-10-CM

## 2020-05-23 LAB — RAPID URINE DRUG SCREEN, HOSP PERFORMED
Amphetamines: NOT DETECTED
Barbiturates: NOT DETECTED
Benzodiazepines: POSITIVE — AB
Cocaine: NOT DETECTED
Opiates: NOT DETECTED
Tetrahydrocannabinol: NOT DETECTED

## 2020-05-23 LAB — COMPREHENSIVE METABOLIC PANEL
ALT: 21 U/L (ref 0–44)
AST: 42 U/L — ABNORMAL HIGH (ref 15–41)
Albumin: 3 g/dL — ABNORMAL LOW (ref 3.5–5.0)
Alkaline Phosphatase: 62 U/L (ref 38–126)
BUN: 98 mg/dL — ABNORMAL HIGH (ref 8–23)
CO2: 23 mmol/L (ref 22–32)
Calcium: 9.9 mg/dL (ref 8.9–10.3)
Chloride: >130 mmol/L (ref 98–111)
Creatinine, Ser: 4.27 mg/dL — ABNORMAL HIGH (ref 0.61–1.24)
GFR, Estimated: 14 mL/min — ABNORMAL LOW (ref >60)
Glucose, Bld: 183 mg/dL — ABNORMAL HIGH (ref 70–99)
Potassium: 3.6 mmol/L (ref 3.5–5.1)
Sodium: 180 mmol/L (ref 135–145)
Total Bilirubin: 1.2 mg/dL (ref 0.3–1.2)
Total Protein: 8.1 g/dL (ref 6.5–8.1)

## 2020-05-23 LAB — LACTIC ACID, PLASMA
Lactic Acid, Venous: 3.8 mmol/L (ref 0.5–1.9)
Lactic Acid, Venous: 2.3 mmol/L (ref 0.5–1.9)
Lactic Acid, Venous: 2.4 mmol/L (ref 0.5–1.9)

## 2020-05-23 LAB — APTT: aPTT: 29 seconds (ref 24–36)

## 2020-05-23 LAB — RESPIRATORY PANEL BY RT PCR (FLU A&B, COVID)
Influenza A by PCR: NEGATIVE
Influenza B by PCR: NEGATIVE
SARS Coronavirus 2 by RT PCR: NEGATIVE

## 2020-05-23 LAB — URINALYSIS, ROUTINE W REFLEX MICROSCOPIC
Bilirubin Urine: NEGATIVE
Glucose, UA: NEGATIVE mg/dL
Ketones, ur: NEGATIVE mg/dL
Leukocytes,Ua: NEGATIVE
Nitrite: NEGATIVE
Protein, ur: 30 mg/dL — AB
Specific Gravity, Urine: 1.017 (ref 1.005–1.030)
pH: 5 (ref 5.0–8.0)

## 2020-05-23 LAB — DIFFERENTIAL
Band Neutrophils: 15 %
Basophils Absolute: 0 10*3/uL (ref 0.0–0.1)
Basophils Relative: 0 %
Eosinophils Absolute: 0 10*3/uL (ref 0.0–0.5)
Eosinophils Relative: 0 %
Lymphocytes Relative: 3 %
Lymphs Abs: 0.5 10*3/uL — ABNORMAL LOW (ref 0.7–4.0)
Monocytes Absolute: 0.7 10*3/uL (ref 0.1–1.0)
Monocytes Relative: 4 %
Neutro Abs: 16.6 10*3/uL — ABNORMAL HIGH (ref 1.7–7.7)
Neutrophils Relative %: 78 %

## 2020-05-23 LAB — CBC
HCT: 42.6 % (ref 39.0–52.0)
Hemoglobin: 12.6 g/dL — ABNORMAL LOW (ref 13.0–17.0)
MCH: 28.1 pg (ref 26.0–34.0)
MCHC: 29.6 g/dL — ABNORMAL LOW (ref 30.0–36.0)
MCV: 95.1 fL (ref 80.0–100.0)
Platelets: 177 10*3/uL (ref 150–400)
RBC: 4.48 MIL/uL (ref 4.22–5.81)
RDW: 18.3 % — ABNORMAL HIGH (ref 11.5–15.5)
WBC: 17.8 10*3/uL — ABNORMAL HIGH (ref 4.0–10.5)
nRBC: 0.4 % — ABNORMAL HIGH (ref 0.0–0.2)

## 2020-05-23 LAB — BLOOD GAS, ARTERIAL
Acid-base deficit: 1.8 mmol/L (ref 0.0–2.0)
Bicarbonate: 22.3 mmol/L (ref 20.0–28.0)
FIO2: 100
O2 Saturation: 99.1 %
Patient temperature: 35.5
pCO2 arterial: 46.3 mmHg (ref 32.0–48.0)
pH, Arterial: 7.32 — ABNORMAL LOW (ref 7.350–7.450)
pO2, Arterial: 385 mmHg — ABNORMAL HIGH (ref 83.0–108.0)

## 2020-05-23 LAB — PROTIME-INR
INR: 1.5 — ABNORMAL HIGH (ref 0.8–1.2)
Prothrombin Time: 17.3 seconds — ABNORMAL HIGH (ref 11.4–15.2)

## 2020-05-23 LAB — GLUCOSE, CAPILLARY: Glucose-Capillary: 105 mg/dL — ABNORMAL HIGH (ref 70–99)

## 2020-05-23 LAB — CBG MONITORING, ED: Glucose-Capillary: 96 mg/dL (ref 70–99)

## 2020-05-23 LAB — ETHANOL: Alcohol, Ethyl (B): <10 mg/dL (ref <10)

## 2020-05-23 MED ORDER — ROCURONIUM BROMIDE 50 MG/5ML IV SOLN
60.0000 mg | Freq: Once | INTRAVENOUS | Status: AC
  Administered 2020-05-23: 60 mg via INTRAVENOUS

## 2020-05-23 MED ORDER — SODIUM CHLORIDE 0.9 % IV SOLN: 2.0000 g | INTRAVENOUS | Status: DC

## 2020-05-23 MED ORDER — FENTANYL CITRATE (PF) 100 MCG/2ML IJ SOLN
50.0000 ug | INTRAMUSCULAR | Status: DC | PRN
  Administered 2020-05-23: 50 ug via INTRAVENOUS
  Filled 2020-05-23: qty 2

## 2020-05-23 MED ORDER — FENTANYL CITRATE (PF) 100 MCG/2ML IJ SOLN: 25.0000 ug | INTRAMUSCULAR | Status: DC | PRN

## 2020-05-23 MED ORDER — SODIUM CHLORIDE 0.9 % IV SOLN
500.0000 mg | INTRAVENOUS | Status: DC
  Administered 2020-05-23: 500 mg via INTRAVENOUS
  Filled 2020-05-23: qty 500

## 2020-05-23 MED ORDER — IPRATROPIUM-ALBUTEROL 0.5-2.5 (3) MG/3ML IN SOLN
3.0000 mL | Freq: Four times a day (QID) | RESPIRATORY_TRACT | Status: DC | PRN
  Administered 2020-05-25: 3 mL via RESPIRATORY_TRACT
  Filled 2020-05-23: qty 3

## 2020-05-23 MED ORDER — ACETAMINOPHEN 160 MG/5ML PO SOLN
650.0000 mg | ORAL | Status: DC | PRN
  Administered 2020-05-24: 650 mg
  Filled 2020-05-23: qty 20.3

## 2020-05-23 MED ORDER — FAMOTIDINE IN NACL 20-0.9 MG/50ML-% IV SOLN
20.0000 mg | INTRAVENOUS | Status: DC
  Administered 2020-05-23: 20 mg via INTRAVENOUS
  Filled 2020-05-23: qty 50

## 2020-05-23 MED ORDER — ASPIRIN 81 MG PO CHEW
81.0000 mg | CHEWABLE_TABLET | Freq: Every day | ORAL | Status: DC
  Administered 2020-05-24 – 2020-05-30 (×4): 81 mg
  Filled 2020-05-23 (×6): qty 1

## 2020-05-23 MED ORDER — DOCUSATE SODIUM 50 MG/5ML PO LIQD
100.0000 mg | Freq: Two times a day (BID) | ORAL | Status: DC
  Administered 2020-05-23 – 2020-05-25 (×4): 100 mg
  Filled 2020-05-23 (×7): qty 10

## 2020-05-23 MED ORDER — CHLORHEXIDINE GLUCONATE 0.12% ORAL RINSE (MEDLINE KIT)
15.0000 mL | Freq: Two times a day (BID) | OROMUCOSAL | Status: DC
  Administered 2020-05-23 – 2020-05-25 (×4): 15 mL via OROMUCOSAL

## 2020-05-23 MED ORDER — LACTATED RINGERS IV SOLN: INTRAVENOUS | Status: DC

## 2020-05-23 MED ORDER — SODIUM CHLORIDE 0.9 % IV BOLUS
1000.0000 mL | Freq: Once | INTRAVENOUS | Status: AC
  Administered 2020-05-23: 1000 mL via INTRAVENOUS

## 2020-05-23 MED ORDER — PROSOURCE TF PO LIQD
45.0000 mL | Freq: Two times a day (BID) | ORAL | Status: DC
  Administered 2020-05-23 – 2020-05-25 (×4): 45 mL
  Filled 2020-05-23 (×3): qty 45

## 2020-05-23 MED ORDER — FENTANYL CITRATE (PF) 100 MCG/2ML IJ SOLN
25.0000 ug | INTRAMUSCULAR | Status: DC | PRN
  Administered 2020-05-23 – 2020-05-25 (×5): 100 ug via INTRAVENOUS
  Filled 2020-05-23 (×5): qty 2

## 2020-05-23 MED ORDER — CLOPIDOGREL BISULFATE 75 MG PO TABS
75.0000 mg | ORAL_TABLET | Freq: Every day | ORAL | Status: DC
  Administered 2020-05-24 – 2020-05-30 (×4): 75 mg
  Filled 2020-05-23 (×6): qty 1

## 2020-05-23 MED ORDER — DOCUSATE SODIUM 100 MG PO CAPS: 100.0000 mg | ORAL_CAPSULE | Freq: Two times a day (BID) | ORAL | Status: DC | PRN

## 2020-05-23 MED ORDER — MIDAZOLAM HCL 2 MG/2ML IJ SOLN
1.0000 mg | INTRAMUSCULAR | Status: DC | PRN
  Administered 2020-05-23: 1 mg via INTRAVENOUS
  Filled 2020-05-23: qty 2

## 2020-05-23 MED ORDER — POLYETHYLENE GLYCOL 3350 17 G PO PACK
17.0000 g | PACK | Freq: Every day | ORAL | Status: DC
  Administered 2020-05-24 – 2020-05-25 (×2): 17 g
  Filled 2020-05-23: qty 1

## 2020-05-23 MED ORDER — MIDAZOLAM HCL 2 MG/2ML IJ SOLN
1.0000 mg | INTRAMUSCULAR | Status: DC | PRN
  Filled 2020-05-23: qty 2

## 2020-05-23 MED ORDER — CHLORHEXIDINE GLUCONATE CLOTH 2 % EX PADS
6.0000 | MEDICATED_PAD | Freq: Every day | CUTANEOUS | Status: DC
  Administered 2020-05-23 – 2020-06-03 (×11): 6 via TOPICAL

## 2020-05-23 MED ORDER — BUDESONIDE 0.5 MG/2ML IN SUSP
0.5000 mg | Freq: Two times a day (BID) | RESPIRATORY_TRACT | Status: DC
  Administered 2020-05-23 – 2020-06-02 (×21): 0.5 mg via RESPIRATORY_TRACT
  Filled 2020-05-23 (×21): qty 2

## 2020-05-23 MED ORDER — ARFORMOTEROL TARTRATE 15 MCG/2ML IN NEBU
15.0000 ug | INHALATION_SOLUTION | Freq: Two times a day (BID) | RESPIRATORY_TRACT | Status: DC
  Administered 2020-05-23 – 2020-06-02 (×21): 15 ug via RESPIRATORY_TRACT
  Filled 2020-05-23 (×21): qty 2

## 2020-05-23 MED ORDER — VITAL HIGH PROTEIN PO LIQD
1000.0000 mL | ORAL | Status: DC
  Administered 2020-05-23 – 2020-05-24 (×2): 1000 mL

## 2020-05-23 MED ORDER — ETOMIDATE 2 MG/ML IV SOLN
20.0000 mg | Freq: Once | INTRAVENOUS | Status: AC
  Administered 2020-05-23: 20 mg via INTRAVENOUS

## 2020-05-23 MED ORDER — MIDAZOLAM HCL 2 MG/2ML IJ SOLN: 1.0000 mg | INTRAMUSCULAR | Status: DC | PRN

## 2020-05-23 MED ORDER — DOCUSATE SODIUM 50 MG/5ML PO LIQD: 100.0000 mg | Freq: Two times a day (BID) | ORAL | Status: DC

## 2020-05-23 MED ORDER — LACTATED RINGERS IV BOLUS
2000.0000 mL | Freq: Once | INTRAVENOUS | Status: AC
  Administered 2020-05-23: 2000 mL via INTRAVENOUS

## 2020-05-23 MED ORDER — SODIUM CHLORIDE 0.9 % IV SOLN
500.0000 mg | INTRAVENOUS | Status: AC
  Administered 2020-05-24 – 2020-05-27 (×4): 500 mg via INTRAVENOUS
  Filled 2020-05-23 (×4): qty 500

## 2020-05-23 MED ORDER — ORAL CARE MOUTH RINSE
15.0000 mL | OROMUCOSAL | Status: DC
  Administered 2020-05-24 – 2020-05-25 (×14): 15 mL via OROMUCOSAL

## 2020-05-23 MED ORDER — SODIUM CHLORIDE 0.9 % IV SOLN
2.0000 g | INTRAVENOUS | Status: AC
  Administered 2020-05-24 – 2020-05-27 (×4): 2 g via INTRAVENOUS
  Filled 2020-05-23 (×2): qty 20
  Filled 2020-05-23: qty 2
  Filled 2020-05-23: qty 20

## 2020-05-23 MED ORDER — POLYETHYLENE GLYCOL 3350 17 G PO PACK: 17.0000 g | PACK | Freq: Every day | ORAL | Status: DC | PRN

## 2020-05-23 MED ORDER — HEPARIN SODIUM (PORCINE) 5000 UNIT/ML IJ SOLN
5000.0000 [IU] | Freq: Three times a day (TID) | INTRAMUSCULAR | Status: DC
  Administered 2020-05-23 – 2020-06-02 (×28): 5000 [IU] via SUBCUTANEOUS
  Filled 2020-05-23 (×24): qty 1

## 2020-05-23 MED ORDER — DEXMEDETOMIDINE HCL IN NACL 200 MCG/50ML IV SOLN
0.0000 ug/kg/h | INTRAVENOUS | Status: DC
  Administered 2020-05-23: 0.4 ug/kg/h via INTRAVENOUS
  Administered 2020-05-24: 0.2 ug/kg/h via INTRAVENOUS
  Administered 2020-05-24: 0.5 ug/kg/h via INTRAVENOUS
  Administered 2020-05-25: 0.298 ug/kg/h via INTRAVENOUS
  Filled 2020-05-23 (×4): qty 50

## 2020-05-23 MED ORDER — SODIUM CHLORIDE 0.9 % IV SOLN: Freq: Once | INTRAVENOUS | Status: AC

## 2020-05-23 MED ORDER — SODIUM CHLORIDE 0.9 % IV SOLN
2.0000 g | INTRAVENOUS | Status: DC
  Administered 2020-05-23: 2 g via INTRAVENOUS
  Filled 2020-05-23: qty 20

## 2020-05-23 MED ORDER — SODIUM CHLORIDE 0.9 % IV SOLN: 500.0000 mg | INTRAVENOUS | Status: DC

## 2020-05-23 MED ORDER — FREE WATER
200.0000 mL | Status: DC
  Administered 2020-05-23 – 2020-05-26 (×17): 200 mL

## 2020-05-23 NOTE — ED Notes (Signed)
I assumed care of this patient at this time. At the time of assuming care, this RN notes that patient is moving all four extremities, shaking head no to all questions asked and turning away from staff when attempting to provide care. Bed is locked in the lowest position, side rails x2 and patient allowed to position self for comfort. Pt on cardiac monitor, vital signs cycling q15 minutes. Will continue to monitor.  Staff having difficulty obtaining SpO2 waveform at this time due to patient restlessness and cold extremities. New orders obtained.

## 2020-05-23 NOTE — ED Notes (Signed)
CRITICAL VALUE ALERT  Critical Value:  Na 180, Chloride >130   Date & Time Notied:  05/23/2020 1627  Provider Notified: Dr. Judd Lien   Orders Received/Actions taken: None yet

## 2020-05-23 NOTE — ED Notes (Signed)
Pt O2 sats 54% EDP notified and at bedside.

## 2020-05-23 NOTE — ED Notes (Signed)
This RN spoke with patient's daughter, [name] and provided and update on current plan of care. [name] gave this RN and ED staff verbal consent for transportation to [facility]. This RN verbalized understanding at this time.

## 2020-05-23 NOTE — ED Notes (Signed)
Dr Criss Alvine at bedside looking for central line.

## 2020-05-23 NOTE — H&P (Addendum)
NAME:  Jose Schaefer, MRN:  [id number], DOB:  05-23-1951, LOS: 0 ADMISSION DATE:  05/23/2020, CONSULTATION DATE:  10/30 REFERRING MD: Dr. Judd Lien, CHIEF COMPLAINT:  AMS    Brief History   69 y/o M admitted 10/30 with AMS in setting of acute hypoxemic respiratory failure with concern for L PNA, AKI (sr cr 4.27),  profound hypernatremia (Na 180)  History of present illness   69 y/o M who presented to [facility] from [facility] with reports of altered mental status.  The patient was last seen walking around 0300 on 10/30.  He slept until noon on 10/30 which was concerning for his caregiver and they activated EMS.  He was found to have saturations of 75% on initial EMS evaluation. The caretaker reported concerns for possible UTI. On arrival to the ER, he was noted to have a pronounced left facial droop and no movement of his left arm - concern for hypoxia mimicking CVA vs TIA/CVA. CT head was evaluated and negative.  The patient was intubated in the ER for AMS and respiratory insufficiency with hypoxemia.  Emergent central line was placed in the ER. Family was unable to be reached by EDP. CXR showed ETT in good position and left sided infiltrate.   On my exam he was intubated and unable to provide further history.   Past Medical History  Dementia  CVA COPD  Tobacco Abuse  Significant Hospital Events   10/30 Admit from SNF with AMS, hypernatremia, suspected PNA  Consults:    Procedures:  ETT 10/30 >> R Femoral TLC 10/30 >>   Significant Diagnostic Tests:  CT Head 10/30 >> no acute abnormality, atrophy and marked chronic microvascular disease  Micro Data:  COVID 10/30 >> negative  Influenza A/B 10/30 >> negative  Tracheal aspirate 10/30 >>  Urine Strep Antigen 10/30 >>   Antimicrobials:  10/30 ceftriaxone >  10/30 azithro >    Interim history/subjective:  As above  Objective   Blood pressure 102/86, pulse (!) 110, temperature 99.1 F (37.3 C), resp. rate (!) 23,  height 5\' 8"  (1.727 m), weight 55 kg, SpO2 100 %.    Vent Mode: PRVC FiO2 (%):  [60 %-100 %] 60 % Set Rate:  [16 bmp] 16 bmp Vt Set:  [450 mL] 450 mL PEEP:  [5 cmH20] 5 cmH20 Plateau Pressure:  [14 cmH20] 14 cmH20   Intake/Output Summary (Last 24 hours) at 05/23/2020 2003 Last data filed at 05/23/2020 1645 Gross per 24 hour  Intake 100 ml  Output --  Net 100 ml   Filed Weights   05/23/20 1716  Weight: 55 kg    Examination: General:  Awake, in bed on vent HENT: NCAT ETT in place PULM: CTA B, vent supported breathing CV: RRR, no mgr GI: BS+, soft, nontender MSK: diminished bulk/tone Derm: pressure ulcer over tibial tuberosity  Neuro: sedated on vent  PCXR 10/30 >> personally reviewed, ett in place, emphysema, ? Interstitial infiltrate vs ggo and ? Air bronchogram? Left lower lobe  Resolved Hospital Problem list      Assessment & Plan:   Acute Hypoxemic Respiratory Failure secondary to Left PNA  COPD, not in exacerbation -PRVC 8cc/kg as rest mode -follow up CXR, ABG post arrival to ICU  -daily SBT / WUA  -ceftriaxone and azithromycin -assess tracheal aspirate -brovana/pulmicort -prn duoneb  Sedation Needs on Mechanical Ventilation  -PAD Protocol with precedex + PRN fentanyl and prn versed -RASS Goal 0 to -1   AKI > suspect  rhabdo Lactic Acidosis  -Trend BMP / urinary output -Replace electrolytes as indicated -Avoid nephrotoxic agents, ensure adequate renal perfusion -check CK -Bolus two more liters now LR, then give LR at 125cc/hr  Hypernatremia  Na >180 on admit  -IVF as above -free water via tube  Hyperglycemia  -SSI, sensitive scale   At Risk Malnutrition  -TF per Nutrition, start tube feeding now  Dementia  -supportive care  -delirium prevention measures   History of stroke -restart ASA, Plavix  Tobacco use -counsel to quit  Aspiration risk, on nectar thick liquids for the last 2 years History of esophageal stricture dilated in  09/2018 -SLP evaluation after extubation   Best practice:  Diet: tube feeding  Pain/Anxiety/Delirium protocol (if indicated): as above VAP protocol (if indicated): yes DVT prophylaxis: sub q heparin GI prophylaxis: famotidine Glucose control: monitor Mobility: bed rest Code Status: full Family Communication: I called his daughter [name] on 10/30 to review his care Disposition: ICU  Labs   CBC: Recent Labs  Lab 05/23/20 1530  WBC 17.8*  NEUTROABS 16.6*  HGB 12.6*  HCT 42.6  MCV 95.1  PLT 177    Basic Metabolic Panel: Recent Labs  Lab 05/23/20 1530  NA 180*  K 3.6  CL >130*  CO2 23  GLUCOSE 183*  BUN 98*  CREATININE 4.27*  CALCIUM 9.9   GFR: Estimated Creatinine Clearance: 12.5 mL/min (A) (by C-G formula based on SCr of 4.27 mg/dL (H)). Recent Labs  Lab 05/23/20 1530 05/23/20 1811  WBC 17.8*  --   LATICACIDVEN 3.8* 2.3*    Liver Function Tests: Recent Labs  Lab 05/23/20 1530  AST 42*  ALT 21  ALKPHOS 62  BILITOT 1.2  PROT 8.1  ALBUMIN 3.0*   No results for input(s): LIPASE, AMYLASE in the last 168 hours. No results for input(s): AMMONIA in the last 168 hours.  ABG    Component Value Date/Time   PHART 7.320 (L) 05/23/2020 1603   PCO2ART 46.3 05/23/2020 1603   PO2ART 385 (H) 05/23/2020 1603   HCO3 22.3 05/23/2020 1603   TCO2 26 08/11/2015 0950   ACIDBASEDEF 1.8 05/23/2020 1603   O2SAT 99.1 05/23/2020 1603     Coagulation Profile: Recent Labs  Lab 05/23/20 1530  INR 1.5*    Cardiac Enzymes: No results for input(s): CKTOTAL, CKMB, CKMBINDEX, TROPONINI in the last 168 hours.  HbA1C: Hgb A1c MFr Bld  Date/Time Value Ref Range Status  08/27/2019 06:00 AM 6.0 (H) 4.8 - 5.6 % Final    Comment:    (NOTE) Pre diabetes:          5.7%-6.4% Diabetes:              >6.4% Glycemic control for   <7.0% adults with diabetes   08/11/2015 09:38 AM 5.7 (H) 4.8 - 5.6 % Final    Comment:    (NOTE)         Pre-diabetes: 5.7 - 6.4          Diabetes: >6.4         Glycemic control for adults with diabetes: <7.0     CBG: Recent Labs  Lab 05/23/20 1451  GLUCAP 96    Review of Systems:   Unable to complete as patient is hypernatremic and intubated.  Past Medical History  He,  has a past medical history of Asthma, COPD (chronic obstructive pulmonary disease) (HCC), CVA (cerebral vascular accident) (HCC) (2001), Dementia (HCC), and Tobacco abuse.   Surgical History  Past Surgical History:  Procedure Laterality Date  . ESOPHAGEAL DILATION N/A 10/13/2018   Procedure: ESOPHAGEAL DILATION;  Surgeon: West Bali, MD;  Location: [facility];  Service: Endoscopy;  Laterality: N/A;  . ESOPHAGOGASTRODUODENOSCOPY N/A 10/13/2018   Procedure: ESOPHAGOGASTRODUODENOSCOPY (EGD);  Surgeon: West Bali, MD;  Location: [facility] SUITE;  Service: Endoscopy;  Laterality: N/A;     Social History   reports that he has been smoking cigarettes. He has a 20.00 pack-year smoking history. He has never used smokeless tobacco. He reports previous alcohol use of about 14.0 standard drinks of alcohol per week. He reports previous drug use. Drugs: Cocaine and Marijuana.   Family History   His family history is not on file.   Allergies No Known Allergies   Home Medications  Prior to Admission medications   Medication Sig Start Date End Date Taking? Authorizing Provider  acetaminophen (TYLENOL) 325 MG tablet Take 2 tablets (650 mg total) by mouth every 4 (four) hours as needed for mild pain (or temp > 37.5 C (99.5 F)). 08/27/19   Shon Hale, MD  albuterol (VENTOLIN HFA) 108 (90 Base) MCG/ACT inhaler Inhale 2 puffs into the lungs every 2 (two) hours as needed for wheezing or shortness of breath (cough). 08/27/19   Shon Hale, MD  aspirin 81 MG EC tablet Take 1 tablet (81 mg total) by mouth daily with breakfast. Swallow whole. 08/27/19   Shon Hale, MD  atorvastatin (LIPITOR) 80 MG tablet Take 0.5 tablets (40 mg total) by mouth  every evening. 08/27/19   Shon Hale, MD  budesonide-formoterol (SYMBICORT) 160-4.5 MCG/ACT inhaler Inhale 2 puffs into the lungs 2 (two) times daily. 08/27/19   Shon Hale, MD  clopidogrel (PLAVIX) 75 MG tablet Take 1 tablet (75 mg total) by mouth daily. 08/27/19 08/26/20  Shon Hale, MD  guaiFENesin (ROBITUSSIN) 100 MG/5ML SOLN Take 5 mLs (100 mg total) by mouth every 4 (four) hours as needed for cough or to loosen phlegm. 08/27/19   Shon Hale, MD  ipratropium-albuterol (DUONEB) 0.5-2.5 (3) MG/3ML SOLN Take 3 mLs by nebulization every 6 (six) hours as needed. 08/27/19   Shon Hale, MD  Maltodextrin-Xanthan Gum (RESOURCE THICKENUP CLEAR) POWD Use as directed to thicken liquids to nectar consistency Patient taking differently: Take by mouth 3 (three) times daily with meals. Use as directed to thicken liquids to nectar consistency 10/13/18   Johnson, Clanford L, MD  mirtazapine (REMERON) 15 MG tablet Take 0.5 tablets (7.5 mg total) by mouth at bedtime. 08/27/19   Shon Hale, MD  senna-docusate (SENOKOT-S) 8.6-50 MG tablet Take 2 tablets by mouth at bedtime. 08/27/19   Shon Hale, MD     Critical care time: 35 minutes    Heber [place], MD [facility] Pager: [phone] Cell: [phone] If no response, call [phone]

## 2020-05-23 NOTE — ED Provider Notes (Signed)
[facility] EMERGENCY DEPARTMENT Provider Note   CSN: [id number] Arrival date & time: 05/23/20  1313  LEVEL 5 CAVEAT - ALTERED MENTAL STATUS  History Chief Complaint  Patient presents with  . Cerebrovascular Accident    Jose Schaefer is a 69 y.o. male.  HPI 69 year old male presents with altered mental status.  History is primarily from EMS as the patient is altered and has dementia no family or caretakers are present.  Patient was last seen normal at 3 AM walking around.  Slept and and at noon he had facial droop and altered mental state and was not talking.  UTI believed to be the cause by the caretaker.   Past Medical History:  Diagnosis Date  . Asthma   . COPD (chronic obstructive pulmonary disease) (HCC)   . CVA (cerebral vascular accident) [facility](HCC) 2001   [place]  . Dementia (HCC)   . Tobacco abuse     Patient Active Problem List   Diagnosis Date Noted  . Dementia (HCC) 08/27/2019  . AMS (altered mental status) 08/27/2019  . Altered mental status/Metabolic Encephalopathy due to Acute CVA 08/27/2019  . Aspiration pneumonia of right lung (HCC)   . Esophagitis   . Abnormal CT scan, esophagus   . Malnutrition of moderate degree 10/10/2018  . COPD exacerbation (HCC) 10/09/2018  . Dysphagia 10/09/2018  . Aspiration into airway 10/09/2018  . Lactic acidosis 10/09/2018  . Cerebral infarction due to unspecified mechanism   . CVA/-Small acute lacunar infarct in the left posterior lentiform/Internal Capsule 08/11/2015  . Cocaine abuse (HCC) 08/11/2015  . ARF (acute renal failure) (HCC) 08/11/2015  . Acute respiratory failure (HCC) 06/26/2011  . COPD (chronic obstructive pulmonary disease) with acute bronchitis (HCC) 06/26/2011  . Tobacco abuse   . Asthma     Past Surgical History:  Procedure Laterality Date  . ESOPHAGEAL DILATION N/A 10/13/2018   Procedure: ESOPHAGEAL DILATION;  Surgeon: West BaliFields, Sandi L, MD;  Location: [facility] ENDO SUITE;  Service: Endoscopy;  Laterality:  N/A;  . ESOPHAGOGASTRODUODENOSCOPY N/A 10/13/2018   Procedure: ESOPHAGOGASTRODUODENOSCOPY (EGD);  Surgeon: West BaliFields, Sandi L, MD;  Location: [facility] ENDO SUITE;  Service: Endoscopy;  Laterality: N/A;       No family history on file.  Social History   Tobacco Use  . Smoking status: Current Every Day Smoker    Packs/day: 0.50    Years: 40.00    Pack years: 20.00    Types: Cigarettes  . Smokeless tobacco: Never Used  Vaping Use  . Vaping Use: Never used  Substance Use Topics  . Alcohol use: Not Currently    Alcohol/week: 14.0 standard drinks    Types: 4 Cans of beer, 10 Shots of liquor per week    Comment: as much as i can get, not current per daughter  . Drug use: Not Currently    Types: Cocaine, Marijuana    Home Medications Prior to Admission medications   Medication Sig Start Date End Date Taking? Authorizing Provider  acetaminophen (TYLENOL) 325 MG tablet Take 2 tablets (650 mg total) by mouth every 4 (four) hours as needed for mild pain (or temp > 37.5 C (99.5 F)). 08/27/19   Shon HaleEmokpae, Courage, MD  albuterol (VENTOLIN HFA) 108 (90 Base) MCG/ACT inhaler Inhale 2 puffs into the lungs every 2 (two) hours as needed for wheezing or shortness of breath (cough). 08/27/19   Shon HaleEmokpae, Courage, MD  aspirin 81 MG EC tablet Take 1 tablet (81 mg total) by mouth daily with breakfast. Swallow whole. 08/27/19  Shon Hale, MD  atorvastatin (LIPITOR) 80 MG tablet Take 0.5 tablets (40 mg total) by mouth every evening. 08/27/19   Shon Hale, MD  budesonide-formoterol (SYMBICORT) 160-4.5 MCG/ACT inhaler Inhale 2 puffs into the lungs 2 (two) times daily. 08/27/19   Shon Hale, MD  clopidogrel (PLAVIX) 75 MG tablet Take 1 tablet (75 mg total) by mouth daily. 08/27/19 08/26/20  Shon Hale, MD  guaiFENesin (ROBITUSSIN) 100 MG/5ML SOLN Take 5 mLs (100 mg total) by mouth every 4 (four) hours as needed for cough or to loosen phlegm. 08/27/19   Shon Hale, MD  ipratropium-albuterol (DUONEB) 0.5-2.5  (3) MG/3ML SOLN Take 3 mLs by nebulization every 6 (six) hours as needed. 08/27/19   Shon Hale, MD  Maltodextrin-Xanthan Gum (RESOURCE THICKENUP CLEAR) POWD Use as directed to thicken liquids to nectar consistency Patient taking differently: Take by mouth 3 (three) times daily with meals. Use as directed to thicken liquids to nectar consistency 10/13/18   Johnson, Clanford L, MD  mirtazapine (REMERON) 15 MG tablet Take 0.5 tablets (7.5 mg total) by mouth at bedtime. 08/27/19   Shon Hale, MD  senna-docusate (SENOKOT-S) 8.6-50 MG tablet Take 2 tablets by mouth at bedtime. 08/27/19   Shon Hale, MD    Allergies    Patient has no known allergies.  Review of Systems   Review of Systems  Unable to perform ROS: Mental status change    Physical Exam Updated Vital Signs BP 97/75   Pulse (!) 106   Resp (!) 52   Ht 5\' 8"  (1.727 m)   SpO2 (!) 84%   BMI 19.77 kg/m   Physical Exam Vitals and nursing note reviewed.  Constitutional:      Appearance: He is well-developed.     Comments: Thin, chronically ill appearing  HENT:     Head: Normocephalic and atraumatic.     Right Ear: External ear normal.     Left Ear: External ear normal.     Nose: Nose normal.  Eyes:     General:        Right eye: No discharge.        Left eye: No discharge.  Cardiovascular:     Rate and Rhythm: Regular rhythm. Tachycardia present.     Heart sounds: Normal heart sounds.     Comments: HR low 100s Pulmonary:     Effort: Pulmonary effort is normal.     Breath sounds: Normal breath sounds.  Abdominal:     Palpations: Abdomen is soft.     Tenderness: There is no abdominal tenderness.  Musculoskeletal:     Cervical back: Neck supple.  Skin:    General: Skin is warm and dry.  Neurological:     Mental Status: He is alert.     Comments: Awake, alert, but does not follow commands well. When I saw him at the bridge on EMS arrival, had pronounced left facial droop, wouldn't move left arm. In the  room he is moving both upper extremities, and seems to grip in left hand. Left leg seems to be weak but is hard to tell because he is not cooperative and doesn't follow commands well  Psychiatric:        Mood and Affect: Mood is not anxious.     ED Results / Procedures / Treatments   Labs (all labs ordered are listed, but only abnormal results are displayed) Labs Reviewed  RESPIRATORY PANEL BY RT PCR (FLU A&B, COVID)  CULTURE, BLOOD (SINGLE)  ETHANOL  PROTIME-INR  APTT  CBC  DIFFERENTIAL  COMPREHENSIVE METABOLIC PANEL  RAPID URINE DRUG SCREEN, HOSP PERFORMED  URINALYSIS, ROUTINE W REFLEX MICROSCOPIC  LACTIC ACID, PLASMA  LACTIC ACID, PLASMA  BLOOD GAS, ARTERIAL  CBG MONITORING, ED  I-STAT CHEM 8, ED    EKG EKG Interpretation  Date/Time:  Saturday May 23 2020 13:22:37 EDT Ventricular Rate:  114 PR Interval:    QRS Duration: 55 QT Interval:  344 QTC Calculation: 476 R Axis:   62 Text Interpretation: Sinus tachycardia Paired ventricular premature complexes Right atrial enlargement Probable anterior infarct, age indeterminate Abnormal T, consider ischemia, inferior leads Baseline wander in lead(s) V3 Poor data quality in current ECG precludes serial comparison Confirmed by Pricilla Loveless [phone]) on 05/23/2020 1:37:20 PM   Radiology CT HEAD WO CONTRAST  Result Date: 05/23/2020 CLINICAL DATA:  Neural deficit. EXAM: CT HEAD WITHOUT CONTRAST TECHNIQUE: Contiguous axial images were obtained from the base of the skull through the vertex without intravenous contrast. COMPARISON:  November 12, 2019 FINDINGS: Brain: No evidence of acute infarction, hemorrhage, hydrocephalus, extra-axial collection or mass lesion/mass effect. Marked deep white matter microangiopathy, stable. Moderate brain parenchymal volume loss with dilation of the ventricular system. Vascular: Calcific atherosclerotic disease of the intra cavernous carotid arteries. Skull: Normal. Negative for fracture or focal  lesion. Sinuses/Orbits: No acute finding. Other: None. IMPRESSION: 1. No acute intracranial abnormality. 2. Atrophy, marked chronic microvascular disease. Electronically Signed   By: Ted Mcalpine M.D.   On: 05/23/2020 14:31   DG Chest Portable 1 View  Result Date: 05/23/2020 CLINICAL DATA:  Weakness EXAM: PORTABLE CHEST 1 VIEW COMPARISON:  Chest radiograph dated 11/21/2019. FINDINGS: The heart size is normal. Vascular calcifications are seen in the aortic arch. Mild patchy airspace opacities are seen in the left lower lung. The right lung is clear. There is no pleural effusion or pneumothorax. The visualized skeletal structures are unremarkable. IMPRESSION: Mild patchy airspace opacities in the left lower lung, suspicious for pneumonia. Electronically Signed   By: Romona Curls M.D.   On: 05/23/2020 14:30    Procedures Procedure Name: Intubation Date/Time: 05/23/2020 3:03 PM Performed by: Pricilla Loveless, MD Pre-anesthesia Checklist: Patient identified, Patient being monitored, Emergency Drugs available, Timeout performed and Suction available Oxygen Delivery Method: Non-rebreather mask Preoxygenation: Pre-oxygenation with 100% oxygen Induction Type: Rapid sequence Ventilation: Mask ventilation without difficulty Laryngoscope Size: Glidescope and 3 Grade View: Grade II Tube size: 7.0 mm Number of attempts: 1 Airway Equipment and Method: Video-laryngoscopy Placement Confirmation: ETT inserted through vocal cords under direct vision,  CO2 detector and Breath sounds checked- equal and bilateral     .Critical Care Performed by: Pricilla Loveless, MD Authorized by: Pricilla Loveless, MD   Critical care provider statement:    Critical care time (minutes):  45   Critical care time was exclusive of:  Separately billable procedures and treating other patients   Critical care was necessary to treat or prevent imminent or life-threatening deterioration of the following conditions:   Respiratory failure and CNS failure or compromise   Critical care was time spent personally by me on the following activities:  Discussions with consultants, evaluation of patient's response to treatment, examination of patient, ordering and performing treatments and interventions, ordering and review of laboratory studies, ordering and review of radiographic studies, pulse oximetry, re-evaluation of patient's condition, obtaining history from patient or surrogate and review of old charts .Central Line  Date/Time: 05/23/2020 3:47 PM Performed by: Pricilla Loveless, MD Authorized by: Pricilla Loveless, MD   Consent:  Consent obtained:  Emergent situation Pre-procedure details:    Skin preparation:  ChloraPrep   Skin preparation agent: Skin preparation agent completely dried prior to procedure   Sedation:    Sedation type:  Deep (on vent) Anesthesia (see MAR for exact dosages):    Anesthesia method:  None Procedure details:    Location:  R femoral   Patient position:  Flat   Procedural supplies:  Triple lumen   Catheter size:  7 Fr   Landmarks identified: yes     Ultrasound guidance: yes     Sterile ultrasound techniques: Sterile gel and sterile probe covers were used     Number of attempts:  1   Successful placement: yes   Post-procedure details:    Post-procedure:  Dressing applied and line sutured   Assessment:  Blood return through all ports and free fluid flow   Patient tolerance of procedure:  Tolerated well, no immediate complications   (including critical care time)  Medications Ordered in ED Medications  cefTRIAXone (ROCEPHIN) 2 g in sodium chloride 0.9 % 100 mL IVPB (has no administration in time range)  azithromycin (ZITHROMAX) 500 mg in sodium chloride 0.9 % 250 mL IVPB (has no administration in time range)  midazolam (VERSED) injection 1 mg (has no administration in time range)  midazolam (VERSED) injection 1 mg (has no administration in time range)  fentaNYL  (SUBLIMAZE) injection 50 mcg (50 mcg Intravenous Given 05/23/20 1504)  fentaNYL (SUBLIMAZE) injection 50 mcg (has no administration in time range)  sodium chloride 0.9 % bolus 1,000 mL (1,000 mLs Intravenous New Bag/Given 05/23/20 1447)  etomidate (AMIDATE) injection 20 mg (20 mg Intravenous Given 05/23/20 1452)  rocuronium (ZEMURON) injection 60 mg (60 mg Intravenous Given 05/23/20 1453)    ED Course  I have reviewed the triage vital signs and the nursing notes.  Pertinent labs & imaging results that were available during my care of the patient were reviewed by me and considered in my medical decision making (see chart for details).    MDM Rules/Calculators/A&P                          Patient strokelike symptoms seem to be improving, and I wonder if this was TIA versus stroke versus severe hypoxia mimicking strokelike symptoms.  After acute head CT imaging and chest x-ray, patient became more more hypoxic and continues to be altered and is interfering with medical treatment.  He is not quite combative but difficult to get access on and hard to treat in his current state.  With severe hypoxia (hard to tell exact SPO2 with poor waveform but his extremities are cool and somewhat cyanotic) decision was made to intubate.  I tried to call daughter who is listed as emergency contact but no answer.  Central line was also placed as above because of poor access but also no blood work was able to be obtained.  Labs are currently pending. Care transferred to Dr. Judd Lien.  Jose Schaefer was evaluated in Emergency Department on 05/23/2020 for the symptoms described in the history of present illness. He was evaluated in the context of the global COVID-19 pandemic, which necessitated consideration that the patient might be at risk for infection with the SARS-CoV-2 virus that causes COVID-19. Institutional protocols and algorithms that pertain to the evaluation of patients at risk for COVID-19 are in a state of rapid  change based on information released by regulatory bodies including the CDC and federal  and state organizations. These policies and algorithms were followed during the patient's care in the ED.  Final Clinical Impression(s) / ED Diagnoses Final diagnoses:  Acute respiratory failure with hypoxia (HCC)  Community acquired pneumonia, unspecified laterality    Rx / DC Orders ED Discharge Orders    None       Pricilla Loveless, MD 05/23/20 [phone]

## 2020-05-23 NOTE — Progress Notes (Signed)
Notified bedside nurse of need to draw lactic acid.  

## 2020-05-23 NOTE — Sepsis Progress Note (Signed)
Code Sepsis met bundle. Lactic acid cleared from 3.8 to 2.3. 1 blood culture drawn (d/t access issues) and antibiotics given.  Arrived from [place] to [facility] ICU. Will continue to follow with critical care management.

## 2020-05-23 NOTE — ED Notes (Signed)
[place] daughter given update. [name] [phone] please call with update.

## 2020-05-23 NOTE — ED Notes (Signed)
Pt returned from CT and X-Ray by radiology staff. This RN to bedside for further assessment and care. Found patient to be laying on left side, in fetal position. This RN was able to obtain an SpO2 reading with a wave that resulted at 54% on 3L [place].  RT notified and made aware of ABG order and are on way to bedside for assessment. PT was repositioned and obtained another saturation of 56%. Pt placed on 15L Non-rebreathe and staff notified.  This RN remains at bedside, vital signs cycling q5 minutes at this time.

## 2020-05-23 NOTE — ED Notes (Signed)
X Ray at bedside at this time.  

## 2020-05-23 NOTE — Progress Notes (Signed)
eLink Physician-Brief Progress Note Patient Name: Jose Schaefer DOB: Dec 16, 1950 MRN: [id number]   Date of Service  05/23/2020  HPI/Events of Note  Request received to review OG and ET tube placement  eICU Interventions  Tip of OGT seen at the level of the stomac. Okd use ETT 23 at the lip with tip seen 2-3 cm below the clavicle. No adjustments recommended at this time     Intervention Category Intermediate Interventions: Diagnostic test evaluation  Darl Pikes 05/23/2020, 11:42 PM

## 2020-05-23 NOTE — ED Notes (Signed)
This RN and Phlebotomy attempted x2 for blood draw without success.

## 2020-05-23 NOTE — ED Notes (Signed)
Date and time results received: 05/23/20 4:06 PM   Test: Lactic  Critical Value: 3.8  Name of Provider Notified: Dr. Criss Alvine  Orders Received? Or Actions Taken?: None yet

## 2020-05-23 NOTE — ED Notes (Signed)
Bair hugger removed at this time for core temperature of 98.1.

## 2020-05-23 NOTE — ED Notes (Signed)
Pt transported to CT and X-Ray at this time by Radiology staff.

## 2020-05-23 NOTE — ED Notes (Signed)
Pt intubated by Dr Criss Alvine with 7 ET tube, +CO2 change, 22 at lip

## 2020-05-23 NOTE — Progress Notes (Signed)
eRN  Following sepsis protocol

## 2020-05-23 NOTE — ED Triage Notes (Signed)
Pt brought in by EMS from group home [facility]. Pt was last seen normal walking at 3 am. Pt slept until 12 pm and caretaker watched him for an hour thinking he may have a UTI then called EMs.. Initial oxygen sat was 75 % placed on NRB, but pt kept removing.

## 2020-05-24 DIAGNOSIS — J189 Pneumonia, unspecified organism: Secondary | ICD-10-CM | POA: Diagnosis not present

## 2020-05-24 LAB — CBC
HCT: 32.6 % — ABNORMAL LOW (ref 39.0–52.0)
Hemoglobin: 9.8 g/dL — ABNORMAL LOW (ref 13.0–17.0)
MCH: 28.2 pg (ref 26.0–34.0)
MCHC: 30.1 g/dL (ref 30.0–36.0)
MCV: 93.9 fL (ref 80.0–100.0)
Platelets: 145 10*3/uL — ABNORMAL LOW (ref 150–400)
RBC: 3.47 MIL/uL — ABNORMAL LOW (ref 4.22–5.81)
RDW: 18.9 % — ABNORMAL HIGH (ref 11.5–15.5)
WBC: 16.1 10*3/uL — ABNORMAL HIGH (ref 4.0–10.5)
nRBC: 0.3 % — ABNORMAL HIGH (ref 0.0–0.2)

## 2020-05-24 LAB — COMPREHENSIVE METABOLIC PANEL
ALT: 24 U/L (ref 0–44)
AST: 47 U/L — ABNORMAL HIGH (ref 15–41)
Albumin: 3 g/dL — ABNORMAL LOW (ref 3.5–5.0)
Alkaline Phosphatase: 64 U/L (ref 38–126)
Anion gap: 17 — ABNORMAL HIGH (ref 5–15)
BUN: 110 mg/dL — ABNORMAL HIGH (ref 8–23)
CO2: 24 mmol/L (ref 22–32)
Calcium: 9.3 mg/dL (ref 8.9–10.3)
Chloride: >130 mmol/L (ref 98–111)
Creatinine, Ser: 5.02 mg/dL — ABNORMAL HIGH (ref 0.61–1.24)
GFR, Estimated: 12 mL/min — ABNORMAL LOW (ref >60)
Glucose, Bld: 171 mg/dL — ABNORMAL HIGH (ref 70–99)
Potassium: 4.6 mmol/L (ref 3.5–5.1)
Sodium: >180 mmol/L (ref 135–145)
Total Bilirubin: 0.4 mg/dL (ref 0.3–1.2)
Total Protein: 8.2 g/dL — ABNORMAL HIGH (ref 6.5–8.1)

## 2020-05-24 LAB — CBC WITH DIFFERENTIAL/PLATELET
Abs Immature Granulocytes: 0.07 10*3/uL (ref 0.00–0.07)
Basophils Absolute: 0 10*3/uL (ref 0.0–0.1)
Basophils Relative: 0 %
Eosinophils Absolute: 0 10*3/uL (ref 0.0–0.5)
Eosinophils Relative: 0 %
HCT: 41.5 % (ref 39.0–52.0)
Hemoglobin: 12.5 g/dL — ABNORMAL LOW (ref 13.0–17.0)
Immature Granulocytes: 0 %
Lymphocytes Relative: 6 %
Lymphs Abs: 1.2 10*3/uL (ref 0.7–4.0)
MCH: 28.2 pg (ref 26.0–34.0)
MCHC: 30.1 g/dL (ref 30.0–36.0)
MCV: 93.5 fL (ref 80.0–100.0)
Monocytes Absolute: 0.7 10*3/uL (ref 0.1–1.0)
Monocytes Relative: 3 %
Neutro Abs: 19.1 10*3/uL — ABNORMAL HIGH (ref 1.7–7.7)
Neutrophils Relative %: 91 %
Platelets: 176 10*3/uL (ref 150–400)
RBC: 4.44 MIL/uL (ref 4.22–5.81)
RDW: 18.8 % — ABNORMAL HIGH (ref 11.5–15.5)
WBC Morphology: INCREASED
WBC: 21.1 10*3/uL — ABNORMAL HIGH (ref 4.0–10.5)
nRBC: 0.3 % — ABNORMAL HIGH (ref 0.0–0.2)

## 2020-05-24 LAB — HIV ANTIBODY (ROUTINE TESTING W REFLEX): HIV Screen 4th Generation wRfx: NONREACTIVE

## 2020-05-24 LAB — MRSA PCR SCREENING: MRSA by PCR: NEGATIVE

## 2020-05-24 LAB — BASIC METABOLIC PANEL
BUN: 98 mg/dL — ABNORMAL HIGH (ref 8–23)
CO2: 24 mmol/L (ref 22–32)
Calcium: 8.4 mg/dL — ABNORMAL LOW (ref 8.9–10.3)
Chloride: >130 mmol/L (ref 98–111)
Creatinine, Ser: 4.51 mg/dL — ABNORMAL HIGH (ref 0.61–1.24)
GFR, Estimated: 13 mL/min — ABNORMAL LOW (ref >60)
Glucose, Bld: 234 mg/dL — ABNORMAL HIGH (ref 70–99)
Potassium: 4.3 mmol/L (ref 3.5–5.1)
Sodium: 172 mmol/L (ref 135–145)

## 2020-05-24 LAB — MAGNESIUM
Magnesium: 3.4 mg/dL — ABNORMAL HIGH (ref 1.7–2.4)
Magnesium: 2.8 mg/dL — ABNORMAL HIGH (ref 1.7–2.4)
Magnesium: 2.6 mg/dL — ABNORMAL HIGH (ref 1.7–2.4)

## 2020-05-24 LAB — GLUCOSE, CAPILLARY
Glucose-Capillary: 128 mg/dL — ABNORMAL HIGH (ref 70–99)
Glucose-Capillary: 135 mg/dL — ABNORMAL HIGH (ref 70–99)
Glucose-Capillary: 141 mg/dL — ABNORMAL HIGH (ref 70–99)
Glucose-Capillary: 167 mg/dL — ABNORMAL HIGH (ref 70–99)
Glucose-Capillary: 184 mg/dL — ABNORMAL HIGH (ref 70–99)
Glucose-Capillary: 206 mg/dL — ABNORMAL HIGH (ref 70–99)
Glucose-Capillary: 89 mg/dL (ref 70–99)

## 2020-05-24 LAB — PHOSPHORUS
Phosphorus: 7.7 mg/dL — ABNORMAL HIGH (ref 2.5–4.6)
Phosphorus: 6.2 mg/dL — ABNORMAL HIGH (ref 2.5–4.6)
Phosphorus: 5.5 mg/dL — ABNORMAL HIGH (ref 2.5–4.6)

## 2020-05-24 LAB — LACTIC ACID, PLASMA: Lactic Acid, Venous: 1.9 mmol/L (ref 0.5–1.9)

## 2020-05-24 LAB — CK: Total CK: 2469 U/L — ABNORMAL HIGH (ref 49–397)

## 2020-05-24 MED ORDER — NOREPINEPHRINE 4 MG/250ML-% IV SOLN
INTRAVENOUS | Status: AC
  Filled 2020-05-24: qty 250

## 2020-05-24 MED ORDER — NOREPINEPHRINE 4 MG/250ML-% IV SOLN
0.0000 ug/min | INTRAVENOUS | Status: DC
  Administered 2020-05-24: 10 ug/min via INTRAVENOUS
  Administered 2020-05-24: 2 ug/min via INTRAVENOUS
  Administered 2020-05-24: 10 ug/min via INTRAVENOUS
  Administered 2020-05-25: 7 ug/min via INTRAVENOUS
  Administered 2020-05-25: 10 ug/min via INTRAVENOUS
  Filled 2020-05-24 (×4): qty 250

## 2020-05-24 MED ORDER — FAMOTIDINE 40 MG/5ML PO SUSR
20.0000 mg | Freq: Every day | ORAL | Status: DC
  Administered 2020-05-24 – 2020-05-25 (×2): 20 mg
  Filled 2020-05-24 (×3): qty 2.5

## 2020-05-24 MED ORDER — SODIUM CHLORIDE 0.9 % IV BOLUS
500.0000 mL | Freq: Once | INTRAVENOUS | Status: AC
  Administered 2020-05-24: 500 mL via INTRAVENOUS

## 2020-05-24 NOTE — Progress Notes (Signed)
eLink Physician-Brief Progress Note Patient Name: Jose Schaefer DOB: Dec 18, 1950 MRN: [id number]   Date of Service  05/24/2020  HPI/Events of Note  Notified of hypotension SBP 70s. Also with minimal urine output. Patient came in dehydrated with Na 181 given 3 liters fluid and NS ongoing.  eICU Interventions  Ordered a 500 cc NS bolus. Will likely need more fluids        Darl Pikes 05/24/2020, 3:53 AM

## 2020-05-24 NOTE — Care Plan (Signed)
Notified ELink RN Gretchin that BP in 70's after 2L LR and decreased sedation gtt. Notified of oliguria- 35 mL since arrival to [facility] ICU

## 2020-05-24 NOTE — Progress Notes (Signed)
eLink Physician-Brief Progress Note Patient Name: Dierks Wach DOB: 22-Aug-1950 MRN: [id number]   Date of Service  05/24/2020  HPI/Events of Note  Persistent hypotension despite fluid bolus  eICU Interventions  Ordered to start norepinephrine Continued on IVF     Intervention Category Major Interventions: Hypotension - evaluation and management  Darl Pikes 05/24/2020, 5:13 AM

## 2020-05-24 NOTE — Progress Notes (Signed)
Verified NS bolus with MD d/t NA 181.  MD: "NS is hypotonic for him. Esp since he is hypotensive he needs resuscitation"

## 2020-05-24 NOTE — Care Plan (Signed)
CRITICAL VALUE ALERT  Critical Value:  Na 172; Cl >130  Date & Time Notied:  05/24/20 0545  Provider Notified: Pola Corn RN  Orders Received/Actions taken: No new orders rec'd

## 2020-05-24 NOTE — Care Plan (Signed)
Assumed care of patient at this time

## 2020-05-24 NOTE — Progress Notes (Signed)
NAME:  Jose Schaefer, MRN:  [id number], DOB:  [birth year], LOS: 1 ADMISSION DATE:  05/23/2020, CONSULTATION DATE:  10/30 REFERRING MD: Dr. Judd Lien, CHIEF COMPLAINT:  AMS    Brief History   69 y/o M admitted 10/30 with AMS in setting of acute hypoxemic respiratory failure with concern for L PNA, AKI (sr cr 4.27),  profound hypernatremia (Na 180)  History of present illness   69 y/o M who presented to [facility] from [facility] with reports of altered mental status.  The patient was last seen walking around 0300 on 10/30.  He slept until noon on 10/30 which was concerning for his caregiver and they activated EMS.  He was found to have saturations of 75% on initial EMS evaluation. The caretaker reported concerns for possible UTI. On arrival to the ER, he was noted to have a pronounced left facial droop and no movement of his left arm - concern for hypoxia mimicking CVA vs TIA/CVA. CT head was evaluated and negative.  The patient was intubated in the ER for AMS and respiratory insufficiency with hypoxemia.  Emergent central line was placed in the ER. Family was unable to be reached by EDP. CXR showed ETT in good position and left sided infiltrate.   On my exam he was intubated and unable to provide further history.   Past Medical History  Dementia  CVA COPD  Tobacco Abuse  Significant Hospital Events   10/30 Admit from SNF with AMS, hypernatremia, suspected PNA 10/31 Started Levophed overnight  Consults:    Procedures:  ETT 10/30 >> R Femoral TLC 10/30 >>   Significant Diagnostic Tests:  CT Head 10/30 >> no acute abnormality, atrophy and marked chronic microvascular disease  Micro Data:  COVID 10/30 >> negative  Influenza A/B 10/30 >> negative  Tracheal aspirate 10/30 >>  Urine Strep Antigen 10/30 >>   Antimicrobials:  10/30 ceftriaxone >  10/30 azithro >    Interim history/subjective:   Started on low-dose Levophed overnight.  Remains on the vent  Objective     Blood pressure 111/69, pulse 69, temperature 99.1 F (37.3 C), resp. rate (!) 25, height 5\' 8"  (1.727 m), weight 42.2 kg, SpO2 96 %.    Vent Mode: PRVC FiO2 (%):  [60 %-100 %] 60 % Set Rate:  [16 bmp] 16 bmp Vt Set:  [450 mL] 450 mL PEEP:  [5 cmH20] 5 cmH20 Plateau Pressure:  [12 cmH20-14 cmH20] 14 cmH20   Intake/Output Summary (Last 24 hours) at 05/24/2020 1036 Last data filed at 05/24/2020 0900 Gross per 24 hour  Intake 3521.06 ml  Output 165 ml  Net 3356.06 ml   Filed Weights   05/23/20 1716 05/24/20 0237  Weight: 55 kg 42.2 kg    Examination: Gen:      Frail, chronically ill-appearing HEENT:  EOMI, sclera anicteric Neck:     No masses; no thyromegaly Lungs:    Clear to auscultation bilaterally; normal respiratory effort CV:         Regular rate and rhythm; no murmurs Abd:      + bowel sounds; soft, non-tender; no palpable masses, no distension Ext:    No edema; adequate peripheral perfusion Skin:      Warm and dry; no rash Neuro: Sedated, arousable  Labs/imaging reviewed Significant for sodium 173, chloride > 130 BUN/creatinine 19/4.51, lactic acid 1.9 WBC 16.1 No new imaging  Resolved Hospital Problem list      Assessment & Plan:   Acute Hypoxemic Respiratory  Failure secondary to Left PNA  COPD, not in exacerbation Continue vent support Start SBT's Continue antibiotics as above Intermittent chest x-ray  Sedation Needs on Mechanical Ventilation  Continue Precedex with as needed fentanyl, Versed  AKI > suspect rhabdo Lactic Acidosis > improved Continue LR at 125 cc an hour Follow labs  Hypernatremia  Na >180 on admit  IV fluids, free water  Hyperglycemia  -SSI, sensitive scale   At Risk Malnutrition  Tube feeds  Dementia Acute metabolic encephalopathy Supportive care  History of stroke Continue aspirin, Plavix  Tobacco use Counsel to quit when extubated  Aspiration risk, on nectar thick liquids for the last 2 years History of  esophageal stricture dilated in 09/2018 -SLP evaluation after extubation   Best practice:  Diet: tube feeding  Pain/Anxiety/Delirium protocol (if indicated): as above VAP protocol (if indicated): yes DVT prophylaxis: sub q heparin GI prophylaxis: famotidine Glucose control: monitor Mobility: bed rest Code Status: full Family Communication: Pending 10/31 Disposition: ICU  Critical care time:    The patient is critically ill with multiple organ system failure and requires high complexity decision making for assessment and support, frequent evaluation and titration of therapies, advanced monitoring, review of radiographic studies and interpretation of complex data.   Critical Care Time devoted to patient care services, exclusive of separately billable procedures, described in this note is 35 minutes.   Chilton Greathouse MD [facility] Please see [email or web address] for pager details.  05/24/2020, 10:37 AM

## 2020-05-24 NOTE — Care Plan (Signed)
CRITICAL VALUE ALERT  Critical Value:  Na 181, Cl >130  Date & Time Notied:  05/24/20 0022  Provider Notified: Ermelinda Das RN  Orders Received/Actions taken: No orders received.

## 2020-05-24 NOTE — Care Plan (Signed)
Spoke with Delma Officer RN to clarify order for NS bolus d/t pt's NA 181. Awaiting clarification.

## 2020-05-24 NOTE — Progress Notes (Signed)
Pt temp increasing, he is more restless/agitated. Tylenol given and precedex increased. Prn fentanyl given and pt repositioned. Will continue to monitor.

## 2020-05-24 NOTE — Care Plan (Signed)
Notified ELink RN of hypotension despite fluid bolus.

## 2020-05-25 ENCOUNTER — Inpatient Hospital Stay (HOSPITAL_COMMUNITY): Payer: Medicare Other

## 2020-05-25 DIAGNOSIS — J189 Pneumonia, unspecified organism: Secondary | ICD-10-CM | POA: Diagnosis not present

## 2020-05-25 LAB — PHOSPHORUS: Phosphorus: 6 mg/dL — ABNORMAL HIGH (ref 2.5–4.6)

## 2020-05-25 LAB — BASIC METABOLIC PANEL
BUN: 91 mg/dL — ABNORMAL HIGH (ref 8–23)
CO2: 21 mmol/L — ABNORMAL LOW (ref 22–32)
Calcium: 7.8 mg/dL — ABNORMAL LOW (ref 8.9–10.3)
Chloride: >130 mmol/L (ref 98–111)
Creatinine, Ser: 4.36 mg/dL — ABNORMAL HIGH (ref 0.61–1.24)
GFR, Estimated: 14 mL/min — ABNORMAL LOW (ref >60)
Glucose, Bld: 267 mg/dL — ABNORMAL HIGH (ref 70–99)
Potassium: 4.2 mmol/L (ref 3.5–5.1)
Sodium: 168 mmol/L (ref 135–145)

## 2020-05-25 LAB — CBC
HCT: 35.6 % — ABNORMAL LOW (ref 39.0–52.0)
Hemoglobin: 10.8 g/dL — ABNORMAL LOW (ref 13.0–17.0)
MCH: 27.9 pg (ref 26.0–34.0)
MCHC: 30.3 g/dL (ref 30.0–36.0)
MCV: 92 fL (ref 80.0–100.0)
Platelets: 142 10*3/uL — ABNORMAL LOW (ref 150–400)
RBC: 3.87 MIL/uL — ABNORMAL LOW (ref 4.22–5.81)
RDW: 18.8 % — ABNORMAL HIGH (ref 11.5–15.5)
WBC: 15.2 10*3/uL — ABNORMAL HIGH (ref 4.0–10.5)
nRBC: 0.5 % — ABNORMAL HIGH (ref 0.0–0.2)

## 2020-05-25 LAB — GLUCOSE, CAPILLARY
Glucose-Capillary: 148 mg/dL — ABNORMAL HIGH (ref 70–99)
Glucose-Capillary: 162 mg/dL — ABNORMAL HIGH (ref 70–99)
Glucose-Capillary: 155 mg/dL — ABNORMAL HIGH (ref 70–99)
Glucose-Capillary: 59 mg/dL — ABNORMAL LOW (ref 70–99)
Glucose-Capillary: 78 mg/dL (ref 70–99)
Glucose-Capillary: 89 mg/dL (ref 70–99)

## 2020-05-25 LAB — MAGNESIUM: Magnesium: 2.6 mg/dL — ABNORMAL HIGH (ref 1.7–2.4)

## 2020-05-25 MED ORDER — ORAL CARE MOUTH RINSE
15.0000 mL | Freq: Two times a day (BID) | OROMUCOSAL | Status: DC
  Administered 2020-05-25 – 2020-06-04 (×16): 15 mL via OROMUCOSAL

## 2020-05-25 MED ORDER — CHLORHEXIDINE GLUCONATE 0.12 % MT SOLN
15.0000 mL | Freq: Two times a day (BID) | OROMUCOSAL | Status: DC
  Administered 2020-05-25 – 2020-06-04 (×15): 15 mL via OROMUCOSAL
  Filled 2020-05-25 (×12): qty 15

## 2020-05-25 MED ORDER — DEXTROSE 50 % IV SOLN
INTRAVENOUS | Status: AC
  Administered 2020-05-25: 25 mL
  Filled 2020-05-25: qty 50

## 2020-05-25 MED ORDER — HALOPERIDOL LACTATE 5 MG/ML IJ SOLN
2.0000 mg | INTRAMUSCULAR | Status: AC | PRN
  Administered 2020-05-25 – 2020-05-26 (×3): 2 mg via INTRAVENOUS
  Filled 2020-05-25 (×2): qty 1

## 2020-05-25 MED ORDER — OSMOLITE 1.2 CAL PO LIQD
1000.0000 mL | ORAL | Status: DC
  Administered 2020-05-25: 1000 mL

## 2020-05-25 NOTE — Procedures (Signed)
Extubation Procedure Note  Patient Details:   Name: Jose Schaefer DOB: Jan 11, 1951 MRN: [id number]   Airway Documentation:    Vent end date: 05/25/20 Vent end time: 1225   Evaluation  O2 sats:100% Complications: none  Pt tolerated procedure well. Bilateral Breath Sounds: Diminished, Clear   Per CCM order, pt extubated and placed on 2L nasal cannula  Revonda Humphrey 05/25/2020, 12:29 PM

## 2020-05-25 NOTE — Progress Notes (Signed)
eLink Physician-Brief Progress Note Patient Name: Ahamed Hofland DOB: 21-Jul-1951 MRN: [id number]   Date of Service  05/25/2020  HPI/Events of Note  Patient removed Dobhoff which he needs for tube feeds and free water flushes. RN requests medication to help calm him down for reinsertion of NG tube.   eICU Interventions  Haldol 2mg  IV Q15 min PRN x3 doses (max) ordered to facilitate safe insertion of NG tube.     Intervention Category Minor Interventions: Agitation / anxiety - evaluation and management  05/25/2020, 10:58 PM

## 2020-05-25 NOTE — Care Plan (Signed)
CRITICAL VALUE ALERT  Critical Value: NA, CL  Date & Time Notied:  05/25/20 0442  Provider Notified: Delma Officer RN  Orders Received/Actions taken: No new orders, trending down

## 2020-05-25 NOTE — TOC Initial Note (Signed)
Transition of Care [facility]) - Initial/Assessment Note    Patient Details  Name: Jose Schaefer MRN: [id number] Date of Birth: Aug 13, 1950  Transition of Care [facility]) CM/SW Contact:    Golda Acre, RN Phone Number: 05/25/2020, 8:27 AM  Clinical Narrative:                 69 y/o M who presented to [facility] from [facility] with reports of altered mental status.  The patient was last seen walking around 0300 on 10/30.  He slept until noon on 10/30 which was concerning for his caregiver and they activated EMS.  He was found to have saturations of 75% on initial EMS evaluation. The caretaker reported concerns for possible UTI. On arrival to the ER, he was noted to have a pronounced left facial droop and no movement of his left arm - concern for hypoxia mimicking CVA vs TIA/CVA. CT head was evaluated and negative.  The patient was intubated in the ER for AMS and respiratory insufficiency with hypoxemia.  Emergent central line was placed in the ER. Family was unable to be reached by EDP. CXR showed ETT in good position and left sided infiltrate.   On my exam he was intubated and unable to provide further history.   Past Medical History  Dementia  CVA COPD  Tobacco Abuse  Significant Hospital Events   10/30 Admit from SNF with AMS, hypernatremia, suspected PNA  Consults:    Procedures:  ETT 10/30 >> R Femoral TLC 10/30 >>   Significant Diagnostic Tests:  CT Head 10/30 >> no acute abnormality, atrophy and marked chronic microvascular disease  Micro Data:  COVID 10/30 >> negative  Influenza A/B 10/30 >> negative  Tracheal aspirate 10/30 >>  Urine Strep Antigen 10/30 >>   Antimicrobials:  10/30 ceftriaxone >  10/30 azithro >   Following for progression  Plan is to return to [employer].  Expected Discharge Plan: Skilled Nursing Facility ([facility]) Barriers to Discharge: Barriers Unresolved (comment) (ventilator and sepsis)   Patient  Goals and CMS Choice Patient states their goals for this hospitalization and ongoing recovery are:: unable to state CMS Medicare.gov Compare Post Acute Care list provided to:: Patient    Expected Discharge Plan and Services Expected Discharge Plan: Skilled Nursing Facility ([facility])   Discharge Planning Services: CM Consult   Living arrangements for the past 2 months: Skilled Nursing Facility                                      Prior Living Arrangements/Services Living arrangements for the past 2 months: Skilled Nursing Facility Lives with:: Facility Resident Patient language and need for interpreter reviewed:: Yes              Criminal Activity/Legal Involvement Pertinent to Current Situation/Hospitalization: No - Comment as needed  Activities of Daily Living      Permission Sought/Granted                  Emotional Assessment Appearance:: Appears stated age Attitude/Demeanor/Rapport: Unable to Assess Affect (typically observed): Unable to Assess     Psych Involvement: No (comment)  Admission diagnosis:  Acute respiratory failure with hypoxia (HCC) [J96.01] Sepsis (HCC) [A41.9] Community acquired pneumonia, unspecified laterality [J18.9] CAP (community acquired pneumonia) [J18.9] Patient Active Problem List   Diagnosis Date Noted  . Sepsis (HCC) 05/23/2020  . CAP (community acquired  pneumonia) 05/23/2020  . Dementia (HCC) 08/27/2019  . AMS (altered mental status) 08/27/2019  . Altered mental status/Metabolic Encephalopathy due to Acute CVA 08/27/2019  . Aspiration pneumonia of right lung (HCC)   . Esophagitis   . Abnormal CT scan, esophagus   . Malnutrition of moderate degree 10/10/2018  . COPD exacerbation (HCC) 10/09/2018  . Dysphagia 10/09/2018  . Aspiration into airway 10/09/2018  . Lactic acidosis 10/09/2018  . Cerebral infarction due to unspecified mechanism   . CVA/-Small acute lacunar infarct in the left posterior  lentiform/Internal Capsule 08/11/2015  . Cocaine abuse (HCC) 08/11/2015  . ARF (acute renal failure) (HCC) 08/11/2015  . Acute respiratory failure (HCC) 06/26/2011  . COPD (chronic obstructive pulmonary disease) with acute bronchitis (HCC) 06/26/2011  . Tobacco abuse   . Asthma    PCP:  [facility], [facility] Pharmacy:   [facility] [place] [place] - [address] [place] [address] Phone: [phone] Fax: [phone]     Social Determinants of Health (SDOH) Interventions    Readmission Risk Interventions No flowsheet data found.

## 2020-05-25 NOTE — Progress Notes (Signed)
  Hypoglycemic Event  CBG: 59 @ 1553  Treatment: 12.5g of D50 @ 1600  Symptoms: Unable to assess  Follow-up CBG: Time: 1622 CBG Result: 78  Possible Reasons for Event: Tube held for change from OG to NG       [employer]

## 2020-05-25 NOTE — Evaluation (Signed)
Clinical/Bedside Swallow Evaluation Patient Details  Name: Jose Schaefer MRN: [id number] Date of Birth: 12/04/1950  Today's Date: 05/25/2020 Time: SLP Start Time (ACUTE ONLY): 1455 SLP Stop Time (ACUTE ONLY): 1515 SLP Time Calculation (min) (ACUTE ONLY): 20 min  Past Medical History:  Past Medical History:  Diagnosis Date  . Asthma   . COPD (chronic obstructive pulmonary disease) (HCC)   . CVA (cerebral vascular accident) [facility]) 2001   [place]  . Dementia (HCC)   . Tobacco abuse    Past Surgical History:  Past Surgical History:  Procedure Laterality Date  . ESOPHAGEAL DILATION N/A 10/13/2018   Procedure: ESOPHAGEAL DILATION;  Surgeon: West Bali, MD;  Location: [facility]O [facility];  Service: Endoscopy;  Laterality: N/A;  . ESOPHAGOGASTRODUODENOSCOPY N/A 10/13/2018   Procedure: ESOPHAGOGASTRODUODENOSCOPY (EGD);  Surgeon: West Bali, MD;  Location: [facility] ENDO SUITE;  Service: Endoscopy;  Laterality: N/A;   HPI:  Patient is a 69 y.o. male with PMH: dementia, CVA, COPD, tobacco abuse, who presented from SNF to [facility] with AMS. When EMS first evaluating patient, oxygen saturations were 75%. In ED, he was emergently intubated and a central line placed. CT Head was negative for acute intracranial abnormality. Patient had MBS in March of 2020, which reported moderate oropharyngeal dysphagia and recommended Dys 1, nectar thick liquids.   Assessment / Plan / Recommendation Clinical Impression  Patient presents with a mod-severe oropharyngeal dysphagia and is at a high risk of aspiration but also high risk of malnutrition/dehydration. Patient's voice was low in intensity and he had persistent audible pharyngeal secretions. He had a weak, fairly non-productive coughing but was able to hock up some secretions to oropharynx and SLP then assisted with suctioning. He accepted water via toothette sponge which he sucked on, exhibiting an inconsistent initiation of swallow. Patient has  nutritional support via Cortrak. He has history of moderate dysphagia and will require an objective swallow study when clinically ready. SLP Visit Diagnosis: Dysphagia, unspecified (R13.10)    Aspiration Risk  Severe aspiration risk;Risk for inadequate nutrition/hydration    Diet Recommendation NPO   Medication Administration: Via alternative means    Other  Recommendations Oral Care Recommendations: Oral care QID   Follow up Recommendations Other (comment) (TBD)      Frequency and Duration min 2x/week  2 weeks       Prognosis Prognosis for Safe Diet Advancement: Guarded Barriers to Reach Goals: Severity of deficits      Swallow Study   General Date of Onset: 05/23/20 HPI: Patient is a 69 y.o. male with PMH: dementia, CVA, COPD, tobacco abuse, who presented from SNF to [facility] with AMS. When EMS first evaluating patient, oxygen saturations were 75%. In ED, he was emergently intubated and a central line placed. CT Head was negative for acute intracranial abnormality. Patient had MBS in March of 2020, which reported moderate oropharyngeal dysphagia and recommended Dys 1, nectar thick liquids. Type of Study: Bedside Swallow Evaluation Previous Swallow Assessment: during previous admission in March of 2021 Diet Prior to this Study: NPO Temperature Spikes Noted: Yes (100.9) Respiratory Status: Nasal cannula History of Recent Intubation: Yes Length of Intubations (days): 3 days Date extubated: 05/25/20 Behavior/Cognition: Alert;Requires cueing;Cooperative Oral Cavity Assessment: Dried secretions Oral Care Completed by SLP: Yes Oral Cavity - Dentition: Edentulous Self-Feeding Abilities: Total assist Patient Positioning: Upright in bed Baseline Vocal Quality: Low vocal intensity Volitional Cough: Weak Volitional Swallow: Unable to elicit    Oral/Motor/Sensory Function Overall Oral Motor/Sensory Function: Within functional  limits   Ice Chips Ice chips: Not tested    Thin Liquid Thin Liquid: Impaired Presentation:  (toothette sponge) Pharyngeal  Phase Impairments: Other (comments) (inconsistently initiating swallow)         Angela Nevin, MA, CCC-SLP Speech Therapy

## 2020-05-25 NOTE — Progress Notes (Signed)
NAME:  Jose Schaefer, MRN:  [id number], DOB:  11-29-1950, LOS: 2 ADMISSION DATE:  05/23/2020, CONSULTATION DATE:  10/30 REFERRING MD: Dr. Judd Lien, CHIEF COMPLAINT:  AMS    Brief History   69 y/o M admitted 10/30 with AMS in setting of acute hypoxemic respiratory failure with concern for L PNA, AKI (sr cr 4.27),  profound hypernatremia (Na 180)  Past Medical History  Dementia  CVA COPD  Tobacco Abuse  Significant Hospital Events   10/30 Admit from SNF with AMS, hypernatremia, suspected PNA  Consults:    Procedures:  ETT 10/30 >> R Femoral TLC 10/30 >>   Significant Diagnostic Tests:  CT Head 10/30 >> no acute abnormality, atrophy and marked chronic microvascular disease  Micro Data:  COVID 10/30 >> negative  Influenza A/B 10/30 >> negative  Tracheal aspirate 10/30 >>  Urine Strep Antigen 10/30 >>   Antimicrobials:  10/30 ceftriaxone >  10/30 azithro >    Interim history/subjective:    Moving around Holding head up    Objective   Blood pressure 100/65, pulse 72, temperature 99 F (37.2 C), resp. rate (!) 21, height 5\' 8"  (1.727 m), weight 50.2 kg, SpO2 99 %.    Vent Mode: PSV;CPAP FiO2 (%):  [30 %-60 %] 30 % Set Rate:  [16 bmp] 16 bmp Vt Set:  [450 mL] 450 mL PEEP:  [5 cmH20] 5 cmH20 Pressure Support:  [5 cmH20-8 cmH20] 5 cmH20 Plateau Pressure:  [11 cmH20-20 cmH20] 13 cmH20   Intake/Output Summary (Last 24 hours) at 05/25/2020 0943 Last data filed at 05/25/2020 0507 Gross per 24 hour  Intake 4500.99 ml  Output 2375 ml  Net 2125.99 ml   Filed Weights   05/23/20 1716 05/24/20 0237 05/25/20 0342  Weight: 55 kg 42.2 kg 50.2 kg    Examination:  General: Chronically ill appearing, in bed on vent HENT: NCAT ETT in place PULM: CTA B, vent supported breathing CV: RRR, no mgr GI: BS+, soft, nontender MSK: normal bulk and tone Neuro: awake on vent, will raise head from pillow   PCXR 10/30 >> personally reviewed, ett in place, emphysema, ? Interstitial  infiltrate vs ggo and ? Air bronchogram? Left lower lobe  Resolved Hospital Problem list      Assessment & Plan:   Acute Hypoxemic Respiratory Failure secondary to Left PNA  COPD, not in exacerbation Extubate this morning Aspiration precautions Brovana/pulmicort to continue duoneb prn   Sedation Needs on Mechanical Ventilation > resolved DC sedation protocol  AKI due to rhabdomyolysis Monitor BMET and UOP Replace electrolytes as needed Continue LR infusion  Hypernatremia  Continue free water via tube after extubation  Hyperglycemia  SSI, sensitive scale  At Risk Malnutrition  Tube feeding to continue  Dementia  Supportive care Delirium prevention  History of stroke Asa, plavix  Tobacco use Counsel to quit  Aspiration risk, on nectar thick liquids for the last 2 years History of esophageal stricture dilated in 09/2018 Place NG tube for feeding/enteric access after extubation SLP evaluation   Best practice:  Diet: tube feeding  Pain/Anxiety/Delirium protocol (if indicated): d/c VAP protocol (if indicated): d/c DVT prophylaxis: sub q heparin GI prophylaxis: famotidine Glucose control: monitor Mobility: bed rest Code Status: full Family Communication: Arica updated bedside Disposition: ICU  Labs   CBC: Recent Labs  Lab 05/23/20 1530 05/23/20 2245 05/24/20 0410 05/25/20 0345  WBC 17.8* 21.1* 16.1* 15.2*  NEUTROABS 16.6* 19.1*  --   --   HGB 12.6* 12.5* 9.8* 10.8*  HCT  42.6 41.5 32.6* 35.6*  MCV 95.1 93.5 93.9 92.0  PLT 177 176 145* 142*    Basic Metabolic Panel: Recent Labs  Lab 05/23/20 1530 05/23/20 2245 05/24/20 0410 05/24/20 1700 05/25/20 0345  NA 180* >180* 172*  --  168*  K 3.6 4.6 4.3  --  4.2  CL >130* >130* >130*  --  >130*  CO2 23 24 24   --  21*  GLUCOSE 183* 171* 234*  --  267*  BUN 98* 110* 98*  --  91*  CREATININE 4.27* 5.02* 4.51*  --  4.36*  CALCIUM 9.9 9.3 8.4*  --  7.8*  MG  --  3.4* 2.8* 2.6* 2.6*  PHOS  --  7.7*  6.2* 5.5* 6.0*   GFR: Estimated Creatinine Clearance: 11.2 mL/min (A) (by C-G formula based on SCr of 4.36 mg/dL (H)). Recent Labs  Lab 05/23/20 1530 05/23/20 1811 05/23/20 2245 05/24/20 0057 05/24/20 0410 05/25/20 0345  WBC 17.8*  --  21.1*  --  16.1* 15.2*  LATICACIDVEN 3.8* 2.3* 2.4* 1.9  --   --     Liver Function Tests: Recent Labs  Lab 05/23/20 1530 05/23/20 2245  AST 42* 47*  ALT 21 24  ALKPHOS 62 64  BILITOT 1.2 0.4  PROT 8.1 8.2*  ALBUMIN 3.0* 3.0*   No results for input(s): LIPASE, AMYLASE in the last 168 hours. No results for input(s): AMMONIA in the last 168 hours.  ABG    Component Value Date/Time   PHART 7.320 (L) 05/23/2020 1603   PCO2ART 46.3 05/23/2020 1603   PO2ART 385 (H) 05/23/2020 1603   HCO3 22.3 05/23/2020 1603   TCO2 26 08/11/2015 0950   ACIDBASEDEF 1.8 05/23/2020 1603   O2SAT 99.1 05/23/2020 1603     Coagulation Profile: Recent Labs  Lab 05/23/20 1530  INR 1.5*    Cardiac Enzymes: Recent Labs  Lab 05/23/20 2245  CKTOTAL 2,469*    HbA1C: Hgb A1c MFr Bld  Date/Time Value Ref Range Status  08/27/2019 06:00 AM 6.0 (H) 4.8 - 5.6 % Final    Comment:    (NOTE) Pre diabetes:          5.7%-6.4% Diabetes:              >6.4% Glycemic control for   <7.0% adults with diabetes   08/11/2015 09:38 AM 5.7 (H) 4.8 - 5.6 % Final    Comment:    (NOTE)         Pre-diabetes: 5.7 - 6.4         Diabetes: >6.4         Glycemic control for adults with diabetes: <7.0     CBG: Recent Labs  Lab 05/24/20 1604 05/24/20 1943 05/24/20 2332 05/25/20 0432 05/25/20 0749  GLUCAP 167* 128* 141* 148* 162*     Critical care time: 31 minutes    13/01/21, MD [place] P[facility]M Pager: [phone] Cell: [phone] If no response, call [phone]

## 2020-05-25 NOTE — Progress Notes (Signed)
Initial Nutrition Assessment  DOCUMENTATION CODES:   Underweight, Severe malnutrition in context of chronic illness  INTERVENTION:  - will adjust TF regimen: Osmolite 1.2 @ 20 ml/hr to advance by 10 ml every 8 hours to reach goal rate of 60 ml/hr. - at goal rate, this regimen will provide 1728 kcal, 80 grams protein, and 1181 ml free water. - free water flush to continue to be per CCM.   Monitor magnesium, potassium, and phosphorus daily for at least 3 days, MD to replete as needed, as pt is at risk for refeeding syndrome despite current hypermagnesemia and hyperphosphatemia given given severe malnutrition and unknown PO nutrition status PTA.     NUTRITION DIAGNOSIS:   Severe Malnutrition related to chronic illness (CVA and dementia) as evidenced by moderate fat depletion, moderate muscle depletion, severe fat depletion, severe muscle depletion.  GOAL:   Patient will meet greater than or equal to 90% of their needs  MONITOR:   TF tolerance, Diet advancement, Labs, Weight trends  REASON FOR ASSESSMENT:   Consult Enteral/tube feeding initiation and management  ASSESSMENT:   69 year-old male with medical history of asthma, tobacco abuse, COPD, dementia, and CVA. [facility] SNF due to AMS. He was last noted to be walking around at 0300 on 10/30 and then slept until noon on that date which was of concern to caregiver. EMS was called and found O2 sats to be in the mid-70s. In the ED he was noted to have pronounced L facial droop and no movement of his L arm. CT head was negative. He was intubated in the ED and transferred to [facility].  Mg and Phos are both trending down from yesterday. Patient was extubated a short time ago, OGT removed, and small bore NGT placed. Abdominal xray report states that the tip of the tube is in the distal stomach.   Weight today is 111 lb, weight yesterday documented as 93 lb, and weight on 10/30 documented as 121  lb. Used today's weight to estimate needs. Weight has been fluctuating significantly in both directions over the past 2.5 years.   No family/visitors present. Able to talk with RN who reports that daughter informed her that at baseline patient is able to ambulate without needing a walker, cane, etc, but that he does drag his L leg a bit while ambulating.  RN reports plan for SLP consult but that it is thought that patient will be unable to meet nutrition needs orally so tube placed for TF as well as free water flushes d/t hypernatremia.   Patient has been receiving Vital High Protein @ 40 ml/hr with 45 ml Prosource TF BID since 10/30 at 2300. Order placed at midnight on 10/31 for 200 ml free water every 3 hours (1600 ml/day).     Labs reviewed; CBGs: 148, 162 mg/dl, Na: 616 mmol/l, Cl: >073 mmol/l, BUN: 91 mg/dl, creatinine: 7.10 mg/dl, Ca: 7.8 mg/dl, Phos: 6 mg/dl, Mg: 2.6 mg/dl, GFR: 14 ml/min.  Medications reviewed; 100 mg colace BID, 20 mg pepcid/day.  IVF; LR @ 125 ml/hr.      NUTRITION - FOCUSED PHYSICAL EXAM:    Most Recent Value  Orbital Region Moderate depletion  Upper Arm Region Severe depletion  Thoracic and Lumbar Region Unable to assess  Buccal Region Moderate depletion  Temple Region Moderate depletion  Clavicle Bone Region Severe depletion  Clavicle and Acromion Bone Region Severe depletion  Dorsal Hand Unable to assess  Patellar Region Moderate  depletion  Anterior Thigh Region Severe depletion  Posterior Calf Region Severe depletion  Edema (RD Assessment) Mild  [BLE]  Hair Reviewed  Eyes Reviewed  Mouth Unable to assess  Skin Reviewed  Nails Reviewed       Diet Order:   Diet Order            Diet NPO time specified  Diet effective now                 EDUCATION NEEDS:   No education needs have been identified at this time  Skin:  Skin Assessment: Skin Integrity Issues: Skin Integrity Issues:: Other (Comment) Other: non-pressure injury to L upper  back  Last BM:  11/2 (type 2)  Height:   Ht Readings from Last 1 Encounters:  05/23/20 5\' 8"  (1.727 m)    Weight:   Wt Readings from Last 1 Encounters:  05/25/20 50.2 kg    Estimated Nutritional Needs:  Kcal:  1610-1760 kcal Protein:  75-85 grams Fluid:  >/= 1.7 L/day     13/01/21, MS, RD, LDN, CNSC Inpatient Clinical Dietitian RD pager # available in [facility]  After hours/weekend pager # available in [facility]

## 2020-05-26 ENCOUNTER — Inpatient Hospital Stay (HOSPITAL_COMMUNITY): Payer: Medicare Other

## 2020-05-26 DIAGNOSIS — J189 Pneumonia, unspecified organism: Secondary | ICD-10-CM | POA: Diagnosis not present

## 2020-05-26 LAB — GLUCOSE, CAPILLARY
Glucose-Capillary: 113 mg/dL — ABNORMAL HIGH (ref 70–99)
Glucose-Capillary: 157 mg/dL — ABNORMAL HIGH (ref 70–99)
Glucose-Capillary: 22 mg/dL — CL (ref 70–99)
Glucose-Capillary: 22 mg/dL — CL (ref 70–99)
Glucose-Capillary: 39 mg/dL — CL (ref 70–99)
Glucose-Capillary: 44 mg/dL — CL (ref 70–99)
Glucose-Capillary: 55 mg/dL — ABNORMAL LOW (ref 70–99)
Glucose-Capillary: 61 mg/dL — ABNORMAL LOW (ref 70–99)
Glucose-Capillary: 66 mg/dL — ABNORMAL LOW (ref 70–99)
Glucose-Capillary: 70 mg/dL (ref 70–99)
Glucose-Capillary: 71 mg/dL (ref 70–99)
Glucose-Capillary: 72 mg/dL (ref 70–99)
Glucose-Capillary: 74 mg/dL (ref 70–99)
Glucose-Capillary: 74 mg/dL (ref 70–99)

## 2020-05-26 LAB — BASIC METABOLIC PANEL
Anion gap: 17 — ABNORMAL HIGH (ref 5–15)
BUN: 92 mg/dL — ABNORMAL HIGH (ref 8–23)
BUN: 74 mg/dL — ABNORMAL HIGH (ref 8–23)
CO2: 21 mmol/L — ABNORMAL LOW (ref 22–32)
CO2: 20 mmol/L — ABNORMAL LOW (ref 22–32)
Calcium: 8.1 mg/dL — ABNORMAL LOW (ref 8.9–10.3)
Calcium: 8 mg/dL — ABNORMAL LOW (ref 8.9–10.3)
Chloride: >130 mmol/L (ref 98–111)
Chloride: 130 mmol/L — ABNORMAL HIGH (ref 98–111)
Creatinine, Ser: 3.68 mg/dL — ABNORMAL HIGH (ref 0.61–1.24)
Creatinine, Ser: 3.51 mg/dL — ABNORMAL HIGH (ref 0.61–1.24)
GFR, Estimated: 17 mL/min — ABNORMAL LOW (ref >60)
GFR, Estimated: 18 mL/min — ABNORMAL LOW (ref >60)
Glucose, Bld: 182 mg/dL — ABNORMAL HIGH (ref 70–99)
Glucose, Bld: 184 mg/dL — ABNORMAL HIGH (ref 70–99)
Potassium: 3.5 mmol/L (ref 3.5–5.1)
Potassium: 3.4 mmol/L — ABNORMAL LOW (ref 3.5–5.1)
Sodium: 169 mmol/L (ref 135–145)
Sodium: 167 mmol/L (ref 135–145)

## 2020-05-26 LAB — GLUCOSE, RANDOM: Glucose, Bld: 118 mg/dL — ABNORMAL HIGH (ref 70–99)

## 2020-05-26 MED ORDER — DEXTROSE 50 % IV SOLN: 25.0000 g | INTRAVENOUS | Status: AC

## 2020-05-26 MED ORDER — DEXTROSE 10 % IV SOLN: INTRAVENOUS | Status: DC

## 2020-05-26 MED ORDER — DEXTROSE 50 % IV SOLN
12.5000 g | INTRAVENOUS | Status: AC
  Administered 2020-05-26: 12.5 g via INTRAVENOUS

## 2020-05-26 MED ORDER — DEXTROSE 50 % IV SOLN
INTRAVENOUS | Status: AC
  Administered 2020-05-26: 50 mL
  Filled 2020-05-26: qty 50

## 2020-05-26 MED ORDER — DEXTROSE 5 % IV SOLN: INTRAVENOUS | Status: DC

## 2020-05-26 MED ORDER — DEXTROSE 50 % IV SOLN
INTRAVENOUS | Status: AC
  Filled 2020-05-26: qty 50

## 2020-05-26 MED ORDER — DEXTROSE 50 % IV SOLN
25.0000 g | INTRAVENOUS | Status: AC
  Administered 2020-05-26: 25 g via INTRAVENOUS

## 2020-05-26 NOTE — Progress Notes (Signed)
Hypoglycemic Event  CBG: 61  Treatment: 12.5 D50   Symptoms: None  Follow-up CBG: Time: 0931 CBG Result: 157  Possible Reasons for Event: NPO  Comments/MD notified: McQuaid MD     Marvia Pickles

## 2020-05-26 NOTE — Progress Notes (Signed)
eLink Physician-Brief Progress Note Patient Name: Jose Schaefer DOB: 1950-12-29 MRN: [id number]   Date of Service  05/26/2020  HPI/Events of Note  Lab blood glucose and POC blood glucose not correlating.   eICU Interventions  Plan: 1. Blood glucose to lab now.      Intervention Category Major Interventions: Hyperglycemia - active titration of insulin therapy  [name], [name] 05/26/2020, 8:46 PM

## 2020-05-26 NOTE — Progress Notes (Signed)
Pt removed dobhoff tube. E-link notified. 3 RNs attempted reinsertion of NG tube unsuccessfully. E-link made aware.

## 2020-05-26 NOTE — Progress Notes (Signed)
CRITICAL VALUE ALERT  Critical Value:  CBG 66 Date & Time Notied:  05/26/20 0017 Provider Notified: e-link Orders Received/Actions taken: half amp of dextrose 50% per protocol

## 2020-05-26 NOTE — Progress Notes (Signed)
eLink Physician-Brief Progress Note Patient Name: Jose Schaefer DOB: 22-Sep-1950 MRN: [id number]   Date of Service  05/26/2020  HPI/Events of Note  RN unable to insert NG tube despite multiple attempts. Consequently, neither tube feeds nor free water flushes (200cc Q3H) are able to be given. Last CBG was in 60s for which he was given a 1/2 amp of D50.   eICU Interventions  Start D5W at 70cc/hr to replace free water flushes and prevent hypoglycemia for now. In AM, additional attempt to obtain enteral access will likely be required.     Intervention Category Intermediate Interventions: Medication change / dose adjustment  [name] 05/26/2020, 12:36 AM

## 2020-05-26 NOTE — Progress Notes (Signed)
OT Cancellation Note  Patient Details Name: Jose Schaefer MRN: [id number] DOB: [birth year]   Cancelled Treatment:    Reason Eval/Treat Not Completed: Medical issues which prohibited therapy. Patient with high sodium 169, due for new feeding tube today. Patient confused, in B mits. Will check back tomorrow as schedule permits.  Marlyce Huge OT OT pager: [phone]   Carmelia Roller 05/26/2020, 8:53 AM

## 2020-05-26 NOTE — Progress Notes (Signed)
eLink Physician-Brief Progress Note Patient Name: Hicks Feick DOB: June 29, 1951 MRN: [id number]   Date of Service  05/26/2020  HPI/Events of Note  Multiple issues: 1. Hypoglycemia - Blood glucose = 39. D50 already given and 2. Agitation - Request for restraints.  eICU Interventions  Plan: 1. D/C D5W IV infusion at 75 mL/hour. 2. D10W IV infusion at 75 mL/hour. 3. Bilateral soft wrist restraints X 14 hours.      Intervention Category Major Interventions: Other:;Delirium, psychosis, severe agitation - evaluation and management  Jacarri, Gesner 05/26/2020, 8:12 PM

## 2020-05-26 NOTE — Progress Notes (Signed)
eLink Physician-Brief Progress Note Patient Name: Jose Schaefer DOB: June 17, 1951 MRN: [id number]   Date of Service  05/26/2020  HPI/Events of Note  Na 169 this AM compared to 168 prior.  There were several hours of inability to administer free water flushes due to patient self-d/c'ing NG tube.  They have since been started on D5W at 70cc/hr.   eICU Interventions  Continue D5W. Obtain new NG tube today and restart free water flushes.     Intervention Category Intermediate Interventions: Electrolyte abnormality - evaluation and management  Marveen Reeks Earl Zellmer 05/26/2020, 4:41 AM

## 2020-05-26 NOTE — TOC Progression Note (Addendum)
Transition of Care [facility]) - Progression Note    Patient Details  Name: Jose Schaefer MRN: [id number] Date of Birth: October 24, 1950  Transition of [facility]) CM/SW Contact  Golda Acre, RN Phone Number: 05/26/2020, 7:22 AM  Clinical Narrative:    Pt extubated on [id number]/ to 4l [place]   Expected Discharge Plan: Skilled Nursing Facility ([facility]) Barriers to Discharge: Barriers Unresolved (comment) (ventilator and sepsis)  Expected Discharge Plan and Services Expected Discharge Plan: Skilled Nursing Facility ([facility])   Discharge Planning Services: CM Consult   Living arrangements for the past 2 months: Skilled Nursing Facility                                       Social Determinants of Health (SDOH) Interventions    Readmission Risk Interventions No flowsheet data found.

## 2020-05-26 NOTE — Progress Notes (Signed)
Hypoglycemic Event  CBG: 22  Treatment: 25g D50  Symptoms: none  Follow-up CBG: Time: 0808 CBG Result: 61  Possible Reasons for Event: NPO   Comments/MD notified: McQuaid MD     Marvia Pickles

## 2020-05-26 NOTE — Progress Notes (Signed)
CRITICAL VALUE ALERT  Critical Value:  Na 169 & Chloride >130  Date & Time Notied:  05/26/20 0430  Provider Notified: e-link  Orders Received/Actions taken: none at this time

## 2020-05-26 NOTE — Progress Notes (Signed)
CRITICAL VALUE ALERT  Critical Value:  Na 167  Date & Time Notied:  11/2 1056 Provider Notified: McQuaid MD   Orders Received/Actions taken: free water flushes

## 2020-05-26 NOTE — Progress Notes (Addendum)
Speech Language Pathology Treatment: Dysphagia  Patient Details Name: Jose Schaefer MRN: [id number] DOB: 1951-04-20 Today's Date: 05/26/2020 Time: 0910-0930 SLP Time Calculation (min) (ACUTE ONLY): 20 min  Assessment / Plan / Recommendation Clinical Impression  Pt seen at bedside for follow up after BSE completed 05/25/20. Pt is currently NPO, and has pulled NGT placed yesterday. Pt is currently awake, resting in bed. Wet voice quality noted. Pt is unable to demonstrate ability to follow commands to cough/clear throat to clear secretions, and his own attempts to clear secretions are weak and ineffective. Pt's left faucial pillar and posterior pharyngeal wall are slightly bloody - RN informed. The rest of pt's oral cavity appears pink, moist, and healthy. Oral care was completed with suction. Minimal blood-tinged secretions removed from oral cavity. Pt was noted to desat into the 70s during oral care, and was encouraged to take slow deep breaths. Sats returned to 80s-90s given a break from oral care. Voice quality remained wet, indicating secretions at the level of the vocal folds and high aspiration risk.    At this time, this SLP does not feel pt is appropriate for PO intake or instrumental study, due to poor secretion management, difficulty following commands, generalized weakness, and comorbidities. Consideration of a Palliative care consult is recommended to establish appropriate goals of care for this gentleman. SLP will continue to follow to assess readiness to advance to PO intake.    HPI HPI: Patient is a 69 y.o. male with PMH: dementia, CVA, COPD, tobacco abuse, who presented from SNF to [facility] with AMS. When EMS first evaluating patient, oxygen saturations were 75%. In ED, he was emergently intubated and a central line placed. CT Head was negative for acute intracranial abnormality. Patient had MBS in March of 2020, which reported moderate oropharyngeal dysphagia and recommended Dys  1, nectar thick liquids.      SLP Plan  Continue with current plan of care       Recommendations  Diet recommendations: NPO Medication Administration: Via alternative means                Oral Care Recommendations: Oral care QID Follow up Recommendations: 24 hour supervision/assistance;Skilled Nursing facility SLP Visit Diagnosis: Dysphagia, unspecified (R13.10) Plan: Continue with current plan of care       GO               Winefred Hillesheim B. Murvin Natal, [facility], CCC-SLP Speech Language Pathologist Office: [phone] Pager: [phone]  Leigh Aurora 05/26/2020, 9:33 AM

## 2020-05-26 NOTE — Progress Notes (Signed)
NAME:  Jose Schaefer, MRN:  [id number], DOB:  05/24/1951, LOS: 3 ADMISSION DATE:  05/23/2020, CONSULTATION DATE:  10/30 REFERRING MD: Dr. Judd Lien, CHIEF COMPLAINT:  AMS    Brief History   69 y/o M admitted 10/30 with AMS in setting of acute hypoxemic respiratory failure with concern for L PNA, AKI (sr cr 4.27),  profound hypernatremia (Na 180)  Past Medical History  Dementia  CVA COPD  Tobacco Abuse  Significant Hospital Events   10/30 Admit from SNF with AMS, hypernatremia, suspected PNA  Consults:  SLP 11/2 >  Procedures:  ETT 10/30 >> 11/1 R Femoral TLC 10/30 >> 11/1  Significant Diagnostic Tests:  CT Head 10/30 >> no acute abnormality, atrophy and marked chronic microvascular disease  Micro Data:  COVID 10/30 >> negative  Influenza A/B 10/30 >> negative  Tracheal aspirate 10/30 >>  Urine Strep Antigen 10/30 >>   Antimicrobials:  10/30 ceftriaxone >  10/30 azithro >    Interim history/subjective:    Extubated Pulled out NG Hypoglycemia overnight 11/2  Objective   Blood pressure 107/68, pulse 98, temperature 98.5 F (36.9 C), temperature source Oral, resp. rate 18, height 5\' 8"  (1.727 m), weight 51.7 kg, SpO2 94 %.    Vent Mode: PSV;CPAP FiO2 (%):  [30 %] 30 % Set Rate:  [16 bmp] 16 bmp Vt Set:  [450 mL] 450 mL PEEP:  [5 cmH20] 5 cmH20 Pressure Support:  [5 cmH20-8 cmH20] 5 cmH20   Intake/Output Summary (Last 24 hours) at 05/26/2020 0724 Last data filed at 05/26/2020 0600 Gross per 24 hour  Intake 3978.32 ml  Output 3030 ml  Net 948.32 ml   Filed Weights   05/24/20 0237 05/25/20 0342 05/26/20 0500  Weight: 42.2 kg 50.2 kg 51.7 kg    Examination:  General:  Resting comfortably in bed HENT: NCAT OP clear PULM: Rhonchi bilaterally B, normal effort CV: RRR, no mgr GI: BS+, soft, nontender MSK: normal bulk and tone Neuro: awake, will nod head appropriately, MAEW   Resolved Hospital Problem list    Sedation Needs on Mechanical Ventilation >  resolved  Assessment & Plan:   Acute Hypoxemic Respiratory Failure secondary to Left PNA  COPD, not in exacerbation Aspiration at baseline> on nectar thick liquids for 3 years Hold MBS today as he will not cooperate (see below) Would like to assess swallowing formally this admission O2 as needed to maintain O2 saturation > 88% Brovana/pulmicort Prn duoneb  AKI due to rhabdomyolysis > improving Monitor BMET and UOP Replace electrolytes as needed Continue LR  Hypernatremia > multifactorial, won't take water at SNF due to nectar thick, unclear if he was getting enough attention at SNF to keep his  Replace NG today Restart free water and tube feeding today Stop D5 after replacing NG  Hyperglycemia  Hypoglycemia overnight 11/1 to 11/2 Continue D5 until tube feedings restart Send glucose from phlebotomy blood draw  At Risk Malnutrition  Continue tube feeding  Dementia > unclear baseline, but daughter says that he is normally conversant but not oriented to year, situation; concern that his dementia may be more advanced considering admission diagnosis of profound dehydration while under SNF care Acute metabolic encephalopathy due to hypernatremia/renal failure Supportive care Delirium prevention measures Call SNF to better understand his recent baseline  History of stroke ASA, plavix  Tobacco use Counsel to quit  Aspiration risk, on nectar thick liquids for the last 2 years History of esophageal stricture dilated in 09/2018 Place NG tube for feeding/enteric access  SLP evaluation > see above  Goals of care: discussed at length with daughter today, she feels that his baseline mental status is far different than what we are seeing. SO hopefully with replacing free water we will see his mental status improve.  She is supportive of placing NG tube and restraints temporarily as needed. Will also try to discuss his baseline with SNF to better understand his current condition and  needs.    Best practice:  Diet: tube feeding  Pain/Anxiety/Delirium protocol (if indicated): d/c VAP protocol (if indicated): d/c DVT prophylaxis: sub q heparin GI prophylaxis: famotidine Glucose control: monitor Mobility: bed rest Code Status: full Family Communication: Arica updated by phone on 11/2 Disposition: ICU  Labs   CBC: Recent Labs  Lab 05/23/20 1530 05/23/20 2245 05/24/20 0410 05/25/20 0345  WBC 17.8* 21.1* 16.1* 15.2*  NEUTROABS 16.6* 19.1*  --   --   HGB 12.6* 12.5* 9.8* 10.8*  HCT 42.6 41.5 32.6* 35.6*  MCV 95.1 93.5 93.9 92.0  PLT 177 176 145* 142*    Basic Metabolic Panel: Recent Labs  Lab 05/23/20 1530 05/23/20 2245 05/24/20 0410 05/24/20 1700 05/25/20 0345 05/26/20 0342  NA 180* >180* 172*  --  168* 169*  K 3.6 4.6 4.3  --  4.2 3.5  CL >130* >130* >130*  --  >130* >130*  CO2 23 24 24   --  21* 21*  GLUCOSE 183* 171* 234*  --  267* 182*  BUN 98* 110* 98*  --  91* 92*  CREATININE 4.27* 5.02* 4.51*  --  4.36* 3.68*  CALCIUM 9.9 9.3 8.4*  --  7.8* 8.1*  MG  --  3.4* 2.8* 2.6* 2.6*  --   PHOS  --  7.7* 6.2* 5.5* 6.0*  --    GFR: Estimated Creatinine Clearance: 13.7 mL/min (A) (by C-G formula based on SCr of 3.68 mg/dL (H)). Recent Labs  Lab 05/23/20 1530 05/23/20 1811 05/23/20 2245 05/24/20 0057 05/24/20 0410 05/25/20 0345  WBC 17.8*  --  21.1*  --  16.1* 15.2*  LATICACIDVEN 3.8* 2.3* 2.4* 1.9  --   --     Liver Function Tests: Recent Labs  Lab 05/23/20 1530 05/23/20 2245  AST 42* 47*  ALT 21 24  ALKPHOS 62 64  BILITOT 1.2 0.4  PROT 8.1 8.2*  ALBUMIN 3.0* 3.0*   No results for input(s): LIPASE, AMYLASE in the last 168 hours. No results for input(s): AMMONIA in the last 168 hours.  ABG    Component Value Date/Time   PHART 7.320 (L) 05/23/2020 1603   PCO2ART 46.3 05/23/2020 1603   PO2ART 385 (H) 05/23/2020 1603   HCO3 22.3 05/23/2020 1603   TCO2 26 08/11/2015 0950   ACIDBASEDEF 1.8 05/23/2020 1603   O2SAT 99.1  05/23/2020 1603     Coagulation Profile: Recent Labs  Lab 05/23/20 1530  INR 1.5*    Cardiac Enzymes: Recent Labs  Lab 05/23/20 2245  CKTOTAL 2,469*    HbA1C: Hgb A1c MFr Bld  Date/Time Value Ref Range Status  08/27/2019 06:00 AM 6.0 (H) 4.8 - 5.6 % Final    Comment:    (NOTE) Pre diabetes:          5.7%-6.4% Diabetes:              >6.4% Glycemic control for   <7.0% adults with diabetes   08/11/2015 09:38 AM 5.7 (H) 4.8 - 5.6 % Final    Comment:    (NOTE)  Pre-diabetes: 5.7 - 6.4         Diabetes: >6.4         Glycemic control for adults with diabetes: <7.0     CBG: Recent Labs  Lab 05/25/20 1622 05/25/20 2016 05/26/20 0017 05/26/20 0049 05/26/20 0401  GLUCAP 78 89 66* 74 70     Critical care time: 45 minutes    Heber Millry, MD [place] [facility] Pager: [phone] Cell: [phone] If no response, call [phone]

## 2020-05-26 NOTE — Progress Notes (Signed)
PT Cancellation Note  Patient Details Name: Jose Schaefer MRN: [id number] DOB: [birth year]   Cancelled Treatment:    Reason Eval/Treat Not Completed: Medical issues which prohibited therapy, Sodium 169, to have tube feed placed.    [name] 05/26/2020, 9:04 AM Blanchard Kelch PT Acute Rehabilitation Services Pager [phone] Office [phone]

## 2020-05-27 ENCOUNTER — Inpatient Hospital Stay (HOSPITAL_COMMUNITY): Payer: Medicare Other

## 2020-05-27 ENCOUNTER — Inpatient Hospital Stay: Payer: Self-pay

## 2020-05-27 DIAGNOSIS — J189 Pneumonia, unspecified organism: Secondary | ICD-10-CM | POA: Diagnosis not present

## 2020-05-27 LAB — CBC WITH DIFFERENTIAL/PLATELET
Abs Immature Granulocytes: 0.04 10*3/uL (ref 0.00–0.07)
Basophils Absolute: 0 10*3/uL (ref 0.0–0.1)
Basophils Relative: 0 %
Eosinophils Absolute: 0.2 10*3/uL (ref 0.0–0.5)
Eosinophils Relative: 1 %
HCT: 32.2 % — ABNORMAL LOW (ref 39.0–52.0)
Hemoglobin: 10.4 g/dL — ABNORMAL LOW (ref 13.0–17.0)
Immature Granulocytes: 0 %
Lymphocytes Relative: 6 %
Lymphs Abs: 0.6 10*3/uL — ABNORMAL LOW (ref 0.7–4.0)
MCH: 28.4 pg (ref 26.0–34.0)
MCHC: 32.3 g/dL (ref 30.0–36.0)
MCV: 88 fL (ref 80.0–100.0)
Monocytes Absolute: 0.5 10*3/uL (ref 0.1–1.0)
Monocytes Relative: 5 %
Neutro Abs: 9.1 10*3/uL — ABNORMAL HIGH (ref 1.7–7.7)
Neutrophils Relative %: 88 %
Platelets: UNDETERMINED 10*3/uL (ref 150–400)
RBC: 3.66 MIL/uL — ABNORMAL LOW (ref 4.22–5.81)
RDW: 18.4 % — ABNORMAL HIGH (ref 11.5–15.5)
WBC: 10.4 10*3/uL (ref 4.0–10.5)
nRBC: 0.2 % (ref 0.0–0.2)

## 2020-05-27 LAB — COMPREHENSIVE METABOLIC PANEL
ALT: 69 U/L — ABNORMAL HIGH (ref 0–44)
AST: 112 U/L — ABNORMAL HIGH (ref 15–41)
Albumin: 2 g/dL — ABNORMAL LOW (ref 3.5–5.0)
Alkaline Phosphatase: 63 U/L (ref 38–126)
Anion gap: 9 (ref 5–15)
BUN: 58 mg/dL — ABNORMAL HIGH (ref 8–23)
CO2: 26 mmol/L (ref 22–32)
Calcium: 7.9 mg/dL — ABNORMAL LOW (ref 8.9–10.3)
Chloride: 127 mmol/L — ABNORMAL HIGH (ref 98–111)
Creatinine, Ser: 2.88 mg/dL — ABNORMAL HIGH (ref 0.61–1.24)
GFR, Estimated: 23 mL/min — ABNORMAL LOW (ref >60)
Glucose, Bld: 153 mg/dL — ABNORMAL HIGH (ref 70–99)
Potassium: 3.8 mmol/L (ref 3.5–5.1)
Sodium: 162 mmol/L (ref 135–145)
Total Bilirubin: 0.7 mg/dL (ref 0.3–1.2)
Total Protein: 6.1 g/dL — ABNORMAL LOW (ref 6.5–8.1)

## 2020-05-27 LAB — BASIC METABOLIC PANEL
Anion gap: 14 (ref 5–15)
Anion gap: 10 (ref 5–15)
BUN: 55 mg/dL — ABNORMAL HIGH (ref 8–23)
BUN: 44 mg/dL — ABNORMAL HIGH (ref 8–23)
CO2: 23 mmol/L (ref 22–32)
CO2: 23 mmol/L (ref 22–32)
Calcium: 7.7 mg/dL — ABNORMAL LOW (ref 8.9–10.3)
Calcium: 7.2 mg/dL — ABNORMAL LOW (ref 8.9–10.3)
Chloride: 126 mmol/L — ABNORMAL HIGH (ref 98–111)
Chloride: 119 mmol/L — ABNORMAL HIGH (ref 98–111)
Creatinine, Ser: 2.75 mg/dL — ABNORMAL HIGH (ref 0.61–1.24)
Creatinine, Ser: 2.4 mg/dL — ABNORMAL HIGH (ref 0.61–1.24)
GFR, Estimated: 24 mL/min — ABNORMAL LOW (ref >60)
GFR, Estimated: 28 mL/min — ABNORMAL LOW (ref >60)
Glucose, Bld: 103 mg/dL — ABNORMAL HIGH (ref 70–99)
Glucose, Bld: 385 mg/dL — ABNORMAL HIGH (ref 70–99)
Potassium: 3.6 mmol/L (ref 3.5–5.1)
Potassium: 3 mmol/L — ABNORMAL LOW (ref 3.5–5.1)
Sodium: 163 mmol/L (ref 135–145)
Sodium: 152 mmol/L — ABNORMAL HIGH (ref 135–145)

## 2020-05-27 LAB — GLUCOSE, CAPILLARY
Glucose-Capillary: 43 mg/dL — CL (ref 70–99)
Glucose-Capillary: 90 mg/dL (ref 70–99)
Glucose-Capillary: 139 mg/dL — ABNORMAL HIGH (ref 70–99)
Glucose-Capillary: 360 mg/dL — ABNORMAL HIGH (ref 70–99)
Glucose-Capillary: 114 mg/dL — ABNORMAL HIGH (ref 70–99)
Glucose-Capillary: 124 mg/dL — ABNORMAL HIGH (ref 70–99)

## 2020-05-27 MED ORDER — DEXTROSE 5 % IV SOLN: INTRAVENOUS | Status: DC

## 2020-05-27 MED ORDER — SODIUM CHLORIDE 0.9% FLUSH
10.0000 mL | Freq: Two times a day (BID) | INTRAVENOUS | Status: DC
  Administered 2020-05-27 – 2020-05-29 (×6): 10 mL
  Administered 2020-05-30: 20 mL
  Administered 2020-05-30 – 2020-05-31 (×2): 10 mL
  Administered 2020-05-31: 20 mL
  Administered 2020-06-01 – 2020-06-03 (×3): 10 mL

## 2020-05-27 MED ORDER — SODIUM CHLORIDE 0.9% FLUSH: 10.0000 mL | INTRAVENOUS | Status: DC | PRN

## 2020-05-27 MED ORDER — POTASSIUM CHLORIDE 10 MEQ/100ML IV SOLN
10.0000 meq | INTRAVENOUS | Status: AC
  Administered 2020-05-27 (×4): 10 meq via INTRAVENOUS
  Filled 2020-05-27 (×4): qty 100

## 2020-05-27 NOTE — Progress Notes (Signed)
Peripherally Inserted Central Catheter Placement  The IV Nurse has discussed with the patient and/or persons authorized to consent for the patient, the purpose of this procedure and the potential benefits and risks involved with this procedure.  The benefits include less needle sticks, lab draws from the catheter, and the patient may be discharged home with the catheter. Risks include, but not limited to, infection, bleeding, blood clot (thrombus formation), and puncture of an artery; nerve damage and irregular heartbeat and possibility to perform a PICC exchange if needed/ordered by physician.  Alternatives to this procedure were also discussed.  Bard Power PICC patient education guide, fact sheet on infection prevention and patient information card has been provided to patient /or left at bedside.  Consent signed by daughter Jarek Longton  [facility] Placement Documentation  PICC Double Lumen 05/27/20 PICC Right Brachial 39 cm 0 cm (Active)  Indication for Insertion or Continuance of Line Vasoactive infusions 05/27/20 1211  Exposed Catheter (cm) 0 cm 05/27/20 1211  Site Assessment Clean;Dry;Intact 05/27/20 1211  Lumen #1 Status Flushed;Saline locked;Blood return noted 05/27/20 1211  Lumen #2 Status Flushed;Saline locked;Blood return noted 05/27/20 1211  Dressing Type Transparent 05/27/20 1211  Dressing Status Clean;Dry;Intact 05/27/20 1211  Antimicrobial disc in place? Yes 05/27/20 1211  Dressing Intervention New dressing 05/27/20 1211  Dressing Change Due 06/03/20 05/27/20 1211       Ethelda Chick 05/27/2020, 12:13 PM

## 2020-05-27 NOTE — Evaluation (Addendum)
Physical Therapy Evaluation Patient Details Name: Jose Schaefer MRN: [id number] DOB: [birth year] Today's Date: 05/27/2020   History of Present Illness  69 y/o M admitted 10/30 with AMS in setting of acute hypoxemic respiratory failure with concern for L PNA, AKI,  profound hypernatremia , intubated 10/30-11/1. CT Head 10/30 >> no acute abnormality, atrophy and marked chronic microvascular disease  Clinical Impression  The patient  Presents with significant weakness of all extremities. Patient positioned in bed , head leaning to the left.. patient required total assist of 2 to sit at bed edge. Patient pushing with RUE. Leaning strongly to the left. Patient demonstrates poor balance reactions. Pt admitted with above diagnosis.  Pt currently with functional limitations due to the deficits listed below (see PT Problem List). Pt will benefit from skilled PT to increase their independence and safety with mobility to allow discharge to the venue listed below.       Follow Up Recommendations SNF;Supervision/Assistance - 24 hour    Equipment Recommendations  None recommended by PT    Recommendations for Other Services       Precautions / Restrictions Precautions Precautions: Fall Precaution Comments: incontinent, L hemiparesis, appears may be worse      Mobility  Bed Mobility Overal bed mobility: Needs Assistance Bed Mobility: Rolling;Supine to Sit;Sit to Supine Rolling: Total assist;+2 for physical assistance;+2 for safety/equipment   Supine to sit: +2 for safety/equipment;+2 for physical assistance;Total assist;HOB elevated Sit to supine: Total assist;+2 for physical assistance;+2 for safety/equipment   General bed mobility comments: patient is unable to assist. Tends to push with right UE    Transfers                 General transfer comment: will need a lift for OOB  Ambulation/Gait                Stairs            Wheelchair Mobility    Modified Rankin  (Stroke Patients Only)       Balance Overall balance assessment: Needs assistance Sitting-balance support: Single extremity supported;Feet supported Sitting balance-Leahy Scale: Zero Sitting balance - Comments: leans to left, pushing to the left with RUE.                                     Pertinent Vitals/Pain Pain Assessment: Faces Faces Pain Scale: No hurt    Home Living Family/patient expects to be discharged to:: Group home                 Additional Comments: family care home    Prior Function           Comments: un sure, -by report, was ambulating at  the home     Hand Dominance        Extremity/Trunk Assessment        Lower Extremity Assessment Lower Extremity Assessment: RLE deficits/detail;LLE deficits/detail RLE Deficits / Details: minimal ankle dorsiflexion, dressing on foot intact, 1+ hip flexion, poor efforts LLE Deficits / Details: RN reports large blisters on dorsum foot, barely moves ankle and 1+ to flex knee    Cervical / Trunk Assessment Cervical / Trunk Assessment:  (Left listing in bed, head with left gaze preference)  Communication   Communication: Expressive difficulties  Cognition Arousal/Alertness: Awake/alert Behavior During Therapy: WFL for tasks assessed/performed Overall Cognitive Status: Impaired/Different from baseline Area of Impairment: Orientation;Attention;Memory;Following commands;Awareness  Orientation Level: Time;Situation Current Attention Level: Focused   Following Commands: Follows one step commands inconsistently   Awareness: Intellectual   General Comments: pt did state that he needed to use BR(having BM during treatment)      General Comments      Exercises     Assessment/Plan    PT Assessment Patient needs continued PT services  PT Problem List Decreased strength;Decreased activity tolerance;Decreased balance;Decreased mobility;Decreased safety  awareness;Decreased knowledge of use of DME;Decreased cognition;Decreased skin integrity       PT Treatment Interventions Functional mobility training;Therapeutic activities;Therapeutic exercise;Patient/family education;Cognitive remediation;Balance training    PT Goals (Current goals can be found in the Care Plan section)  Acute Rehab PT Goals Patient Stated Goal: I am ready PT Goal Formulation: Patient unable to participate in goal setting Time For Goal Achievement: 06/10/20 Potential to Achieve Goals: Fair (very guarded)    Frequency Min 2X/week   Barriers to discharge        Co-evaluation PT/OT/SLP Co-Evaluation/Treatment: Yes Reason for Co-Treatment: For patient/therapist safety;To address functional/ADL transfers   OT goals addressed during session: ADL's and self-care       AM-PAC PT "6 Clicks" Mobility  Outcome Measure Help needed turning from your back to your side while in a flat bed without using bedrails?: Total Help needed moving from lying on your back to sitting on the side of a flat bed without using bedrails?: Total Help needed moving to and from a bed to a chair (including a wheelchair)?: Total Help needed standing up from a chair using your arms (e.g., wheelchair or bedside chair)?: Total Help needed to walk in hospital room?: Total Help needed climbing 3-5 steps with a railing? : Total 6 Click Score: 6    End of Session Equipment Utilized During Treatment: Oxygen Activity Tolerance: Patient limited by fatigue Patient left: in bed;with call bell/phone within reach;with bed alarm set Nurse Communication: Mobility status;Need for lift equipment PT Visit Diagnosis: Unsteadiness on feet (R26.81)    Time: 0932-6712 PT Time Calculation (min) (ACUTE ONLY): 24 min   Charges:   PT Evaluation $PT Eval moderate Complexity: 1 mod          Blanchard Kelch PT Acute Rehabilitation Services Pager [phone] Office [phone]   [name] 05/27/2020, 12:35 PM

## 2020-05-27 NOTE — Progress Notes (Signed)
Occupational Therapy Evaluation  Patient with functional deficits listed below impacting safety and independence with self care. Patient unable to provide history, per chart review states living with caregiver assistance and independent with ambulation. Currently patient requires total A x1-2 for bed mobility and peri care. Upon sitting upright patient with L bias pushing to L. Also in semi-supine patient demonstrates L bias with head, patient will cross body midline to look R at OT and attempted to position pillow to bring head to more neutral alignment. Recommend continued acute OT services in order to facilitate D/C to venue listed below.    05/27/20 1234  OT Visit Information  Last OT Received On 05/27/20  Assistance Needed +2  PT/OT/SLP Co-Evaluation/Treatment Yes  Reason for Co-Treatment For patient/therapist safety;To address functional/ADL transfers  OT goals addressed during session ADL's and self-care  History of Present Illness 69 y/o M admitted 10/30 with AMS in setting of acute hypoxemic respiratory failure with concern for L PNA, AKI,  profound hypernatremia , intubated 10/30-11/1. CT Head 10/30 >> no acute abnormality, atrophy and marked chronic microvascular disease  Precautions  Precautions Fall  Precaution Comments incontinent, L hemiparesis, appears may be worse  Restrictions  Weight Bearing Restrictions No  Home Living  Family/patient expects to be discharged to: Group home  Additional Comments family care home  Prior Function  Level of Independence Independent  Comments unsure, -per chart review was ambulating at the home  Communication  Communication Expressive difficulties  Pain Assessment  Pain Assessment Faces  Faces Pain Scale 0  Cognition  Arousal/Alertness Awake/alert  Behavior During Therapy [facility] for tasks assessed/performed  Overall Cognitive Status Impaired/Different from baseline  Area of Impairment Orientation;Attention;Memory;Following  commands;Awareness  Orientation Level Time;Situation  Current Attention Level Focused  Memory Decreased short-term memory  Following Commands Follows one step commands inconsistently  Awareness Intellectual  General Comments patient with L bias and significant pushing to the L seated EOB  Upper Extremity Assessment  Upper Extremity Assessment Generalized weakness;RUE deficits/detail;LUE deficits/detail  RUE Deficits / Details 2/5 shoulder 3-/5 elbow 3/5 grip  RUE Coordination decreased fine motor;decreased gross motor  LUE Deficits / Details 2-/5 shoulder, 2+/5 elbow, 3/5 grip  LUE Coordination decreased fine motor;decreased gross motor  Lower Extremity Assessment  Lower Extremity Assessment Defer to PT evaluation  ADL  Overall ADL's  Needs assistance/impaired  Grooming Maximal assistance;Bed level  Upper Body Bathing Maximal assistance;Bed level  Lower Body Bathing Total assistance;Bed level  Upper Body Dressing  Maximal assistance;Bed level  Lower Body Dressing Total assistance;Bed level  Toilet Transfer Details (indicate cue type and reason) deferred   Toileting- Clothing Manipulation and Hygiene Total assistance;Bed level  Toileting - Clothing Manipulation Details (indicate cue type and reason) rolling in bed d/t bowel movement  Functional mobility during ADLs Total assistance  General ADL Comments patient requiring significant assistance with self care due to decreased coordination, strength, activity tolerance, safety, cognition   Bed Mobility  Overal bed mobility Needs Assistance  Bed Mobility Rolling;Supine to Sit;Sit to Supine  Rolling Total assist;+2 for physical assistance;+2 for safety/equipment  Supine to sit +2 for safety/equipment;+2 for physical assistance;Total assist;HOB elevated  Sit to supine Total assist;+2 for physical assistance;+2 for safety/equipment  General bed mobility comments patient is unable to assist. Tends to push with L UE  Transfers  General  transfer comment will need a lift for OOB  Balance  Overall balance assessment Needs assistance  Sitting-balance support Single extremity supported;Feet supported  Sitting balance-Leahy Scale Zero  Sitting  balance - Comments leans to left, pushing to the left   OT - End of Session  Activity Tolerance Patient tolerated treatment well  Patient left in bed;with call bell/phone within reach;with bed alarm set;with restraints reapplied  Nurse Communication Mobility status  OT Assessment  OT Recommendation/Assessment Patient needs continued OT Services  OT Visit Diagnosis Other abnormalities of gait and mobility (R26.89);Muscle weakness (generalized) (M62.81);Other symptoms and signs involving cognitive function  OT Problem List Decreased strength;Decreased range of motion;Decreased activity tolerance;Impaired balance (sitting and/or standing);Decreased coordination;Decreased cognition;Decreased safety awareness;Decreased knowledge of use of DME or AE;Impaired UE functional use  OT Plan  OT Frequency (ACUTE ONLY) Min 2X/week  OT Treatment/Interventions (ACUTE ONLY) Self-care/ADL training;Therapeutic exercise;Neuromuscular education;DME and/or AE instruction;Therapeutic activities;Cognitive remediation/compensation;Patient/family education;Balance training  AM-PAC OT "6 Clicks" Daily Activity Outcome Measure (Version 2)  Help from another person eating meals? 2  Help from another person taking care of personal grooming? 2  Help from another person toileting, which includes using toliet, bedpan, or urinal? 1  Help from another person bathing (including washing, rinsing, drying)? 2  Help from another person to put on and taking off regular upper body clothing? 2  Help from another person to put on and taking off regular lower body clothing? 1  6 Click Score 10  OT Recommendation  Follow Up Recommendations SNF;Supervision/Assistance - 24 hour  OT Equipment Other (comment) (defer to next venue)   Individuals Consulted  Consulted and Agree with Results and Recommendations Patient  Acute Rehab OT Goals  Patient Stated Goal I am ready  OT Goal Formulation With patient  Time For Goal Achievement 06/10/20  Potential to Achieve Goals Good  OT Time Calculation  OT Start Time (ACUTE ONLY) 0929  OT Stop Time (ACUTE ONLY) 0953  OT Time Calculation (min) 24 min  OT General Charges  $OT Visit 1 Visit  OT Evaluation  $OT Eval Low Complexity 1 Low  Written Expression  Dominant Hand Right   Marlyce Huge OT OT pager: [phone]

## 2020-05-27 NOTE — Procedures (Signed)
Objective Swallowing Evaluation: Type of Study: MBS-Modified Barium Swallow Study   Patient Details  Name: Jose Schaefer MRN: [id number] Date of Birth: April 28, 1951  Today's Date: 05/27/2020 Time: SLP Start Time (ACUTE ONLY): 1405 -SLP Stop Time (ACUTE ONLY): 1437  SLP Time Calculation (min) (ACUTE ONLY): 32 min   Past Medical History:  Past Medical History:  Diagnosis Date  . Asthma   . COPD (chronic obstructive pulmonary disease) (HCC)   . CVA (cerebral vascular accident) [facility]) 2001   [place]  . Dementia (HCC)   . Tobacco abuse    Past Surgical History:  Past Surgical History:  Procedure Laterality Date  . ESOPHAGEAL DILATION N/A 10/13/2018   Procedure: ESOPHAGEAL DILATION;  Surgeon: West Bali, MD;  Location: [facility];  Service: Endoscopy;  Laterality: N/A;  . ESOPHAGOGASTRODUODENOSCOPY N/A 10/13/2018   Procedure: ESOPHAGOGASTRODUODENOSCOPY (EGD);  Surgeon: West Bali, MD;  Location: [facility];  Service: Endoscopy;  Laterality: N/A;   HPI: Patient is a 69 y.o. male with PMH: dementia, CVA, COPD, tobacco abuse, who presented from SNF to [facility] with AMS. When EMS first evaluating patient, oxygen saturations were 75%. In ED, he was emergently intubated and a central line placed. CT Head was negative for acute intracranial abnormality. Patient had MBS in March of 2020, which reported moderate oropharyngeal dysphagia and recommended Dys 1, nectar thick liquids.  Pt required intubation from 10/30 - 11/1.  Pt has undergone clinical swallow evaluation with recommendations for NPO due to mentation, wet voice and lack of clearance ability.  Daughter [name] - present and reports she is his HCPOA.  She also admits he has premorbid dysphagia and coughs with solids therefore only consumes Ensures, puddings and applesauce.  Pt will not drink the liquid with thickener in it per daughter but he likes plain water.   Subjective: alert, verbalizing some but voice very  weak    Assessment / Plan / Recommendation  CHL IP CLINICAL IMPRESSIONS 05/27/2020  Clinical Impression Pt presents with oral more than pharyngeal sensorimotor dysphagia mostly c/b delayed and discoordinated oral transiting due to weakness.  At times, pt required verbal cues to swallow and delays in oral transiting varied from 40 seconds to 6 seconds.    Lingual pumping present with premature spillage of boluses into pharynx.  Verbal cues "1, 2, 3, swallow" appeared helpful in improving timing of swallow. This oral control issue allows spillage of thin barium into trachea prior to swallow trigger.  Chin tuck posture was not tested due to extent of oral deficits.    Pharyngeal swallow was minimally weak with decreased laryngeal closure and tongue base retraction.  Aspiration of thin was noted and was trace but did produce overt coughing.  No aspiration of nectar, pudding, nor cracker bolus apparent.    Pt will remain elevated risk of aspiration and malnutrition due to his delay in swallowing and weak cough.    Given he premorbidly consumed mostly Ensures, puddings, applesauce, etc only - recommend to initiate either full liquid (nectar) vs puree/nectar and allow free water protocol between meals to maximize hydration, QOL and comfort.  Recommend pt be allowed tsps thin and ice chips any time.    Will follow for family and pt education.  Would recommend a palliative consult to establish GOC with this pt who has experienced progressive dysphagia at least since last year.    SLP Visit Diagnosis Dysphagia, oropharyngeal phase (R13.12)  Attention and concentration deficit following --  Frontal lobe and executive  function deficit following --  Impact on safety and function Moderate aspiration risk;Risk for inadequate nutrition/hydration      CHL IP TREATMENT RECOMMENDATION 05/27/2020  Treatment Recommendations Therapy as outlined in treatment plan below     Prognosis 05/27/2020  Prognosis for Safe  Diet Advancement Fair  Barriers to Reach Goals Time post onset;Cognitive deficits  Barriers/Prognosis Comment --    CHL IP DIET RECOMMENDATION 05/27/2020  SLP Diet Recommendations Nectar thick liquid;Dysphagia 1 (Puree) solids; Tsps thin and ice chips ok any time, [place] Levels water protocol - thin water ok between meals  Liquid Administration via Cup  Medication Administration Crushed with puree  Compensations Slow rate;Small sips/bites;Other; oral suction prn, Drink liquids t/o intake  Postural Changes --      CHL IP OTHER RECOMMENDATIONS 05/27/2020  Recommended Consults --  Oral Care Recommendations Oral care QID  Other Recommendations Order thickener from pharmacy;Have oral suction available      CHL IP FOLLOW UP RECOMMENDATIONS 05/27/2020  Follow up Recommendations 24 hour supervision/assistance;Skilled Nursing facility      CHL IP FREQUENCY AND DURATION 05/27/2020  Speech Therapy Frequency (ACUTE ONLY) min 2x/week  Treatment Duration 2 weeks           CHL IP ORAL PHASE 05/27/2020  Oral Phase Impaired  Oral - Pudding Teaspoon --  Oral - Pudding Cup --  Oral - Honey Teaspoon --  Oral - Honey Cup --  Oral - Nectar Teaspoon Lingual pumping;Delayed oral transit;Weak lingual manipulation;Premature spillage  Oral - Nectar Cup Lingual pumping;Premature spillage;Delayed oral transit;Weak lingual manipulation  Oral - Nectar Straw Lingual pumping;Premature spillage;Delayed oral transit;Weak lingual manipulation;Decreased bolus cohesion  Oral - Thin Teaspoon Premature spillage;Weak lingual manipulation;Delayed oral transit  Oral - Thin Cup Delayed oral transit;Decreased bolus cohesion;Premature spillage;Weak lingual manipulation;Lingual pumping  Oral - Thin Straw --  Oral - Puree Premature spillage;Decreased bolus cohesion;Weak lingual manipulation  Oral - Mech Soft Impaired mastication;Weak lingual manipulation;Reduced posterior propulsion;Lingual pumping;Delayed oral transit;Premature  spillage;Decreased bolus cohesion  Oral - Regular --  Oral - Multi-Consistency --  Oral - Pill --  Oral Phase - Comment pt appeared to benefit from SLP verbalizing "1,2,3" to aid in oral transiting timing coordination (although inconsistent); initial delay with transiting was 40 seconds with nectar tsp, 13 seconds with puree , six seconds with thin, impaired oral control allows premature spillage of barium into pharynx and into larynx with thin, lingual pumping and delay noted across all consistencies which will increase his malnutrition and aspiration risk    CHL IP PHARYNGEAL PHASE 05/27/2020  Pharyngeal Phase Impaired  Pharyngeal- Pudding Teaspoon --  Pharyngeal --  Pharyngeal- Pudding Cup --  Pharyngeal --  Pharyngeal- Honey Teaspoon --  Pharyngeal --  Pharyngeal- Honey Cup --  Pharyngeal --  Pharyngeal- Nectar Teaspoon Delayed swallow initiation-vallecula;Pharyngeal residue - valleculae  Pharyngeal Material does not enter airway  Pharyngeal- Nectar Cup Pharyngeal residue - valleculae  Pharyngeal --  Pharyngeal- Nectar Straw --  Pharyngeal Material does not enter airway  Pharyngeal- Thin Teaspoon Penetration/Aspiration during swallow;Delayed swallow initiation-vallecula;Reduced laryngeal elevation;Reduced airway/laryngeal closure  Pharyngeal Material enters airway, remains ABOVE vocal cords and not ejected out  Pharyngeal- Thin Cup Penetration/Aspiration during swallow;Reduced airway/laryngeal closure;Reduced laryngeal elevation;Reduced tongue base retraction;Trace aspiration  Pharyngeal Material enters airway, passes BELOW cords and not ejected out despite cough attempt by patient  Pharyngeal- Thin Straw Penetration/Aspiration before swallow;Penetration/Aspiration during swallow;Trace aspiration  Pharyngeal Material enters airway, CONTACTS cords and then ejected out;Material enters airway, passes BELOW cords and not ejected out despite  cough attempt by patient  Pharyngeal- Puree  Delayed swallow initiation-vallecula  Pharyngeal --  Pharyngeal- Mechanical Soft Delayed swallow initiation-vallecula;Pharyngeal residue - valleculae  Pharyngeal Material does not enter airway  Pharyngeal- Regular --  Pharyngeal --  Pharyngeal- Multi-consistency --  Pharyngeal --  Pharyngeal- Pill --  Pharyngeal --  Pharyngeal Comment Reflexive cough response x2 during MBS with mild aspiration; aspirates were not cleared with reflexive nor cued cough; Pt did not conduct dry swallow on cue and attempts to "hock" - forcibly expectectorate did not clear mild vallecular retention; SLP advised pt to continue to work on strenghtening "hock" to clear pharynx     CHL IP CERVICAL ESOPHAGEAL PHASE 05/27/2020  Cervical Esophageal Phase WFL  Pudding Teaspoon --  Pudding Cup --  Honey Teaspoon --  Honey Cup --  Nectar Teaspoon --  Nectar Cup --  Nectar Straw --  Thin Teaspoon --  Thin Cup --  Thin Straw --  Puree --  Mechanical Soft --  Regular --  Multi-consistency --  Pill --  Cervical Esophageal Comment --   [name], MS [facility] SLP Acute Rehab Services Office [phone] Pager [phone]    [name] 05/27/2020, 3:37 PM

## 2020-05-27 NOTE — Progress Notes (Signed)
NAME:  Jose Schaefer, MRN:  [id number], DOB:  1951-03-31, LOS: 4 ADMISSION DATE:  05/23/2020, CONSULTATION DATE:  10/30 REFERRING MD: Dr. Judd Lien, CHIEF COMPLAINT:  AMS    Brief History   69 y/o M admitted 10/30 with AMS in setting of acute hypoxemic respiratory failure with concern for L PNA, AKI (sr cr 4.27),  profound hypernatremia (Na 180)  Past Medical History  Dementia  CVA COPD  Tobacco Abuse  Significant Hospital Events   10/30 Admit from SNF with AMS, hypernatremia, suspected PNA 11/1 extubated 11/2 work on correcting water def. Keeps pulling out NGT Consults:  SLP 11/2 >  Procedures:  ETT 10/30 >> 11/1 R Femoral TLC 10/30 >> 11/1  Significant Diagnostic Tests:  CT Head 10/30 >> no acute abnormality, atrophy and marked chronic microvascular disease  Micro Data:  COVID 10/30 >> negative  Influenza A/B 10/30 >> negative  Tracheal aspirate 10/30 >> not drawn  Urine Strep Antigen 10/30 >> neg  Antimicrobials:  10/30 ceftriaxone >  10/30 azithro >    Interim history/subjective:  Lying in bed.  Currently awake, was placed on D10 last night for episodes of hypoglycemia, and however Serum glucose checks suggest CBGs may have been erroneous Objective   Blood pressure 110/72, pulse 92, temperature 98.1 F (36.7 C), temperature source Axillary, resp. rate 14, height 5\' 8"  (1.727 m), weight 50.2 kg, SpO2 94 %.        Intake/Output Summary (Last 24 hours) at 05/27/2020 0743 Last data filed at 05/27/2020 0733 Gross per 24 hour  Intake 4737.6 ml  Output 3100 ml  Net 1637.6 ml   Filed Weights   05/25/20 0342 05/26/20 0500 05/27/20 0500  Weight: 50.2 kg 51.7 kg 50.2 kg    Examination:  General: Chronically ill 69 year old male lying in bed awake, somewhat interactive.  Not following commands. HEENT endentulous, temporal wasting, mucous membranes moist, no JVD Pulmonary: Diminished bases no accessory use Cardiac: Regular rate and rhythm Abdomen: Soft not tender no  organomegaly Neuro: Awake, tracks in room, nonverbal currently.  Not following commands currently.  Generalized weakness, however does continue to pull out nasogastric tubes GU: Clear yellow. Extremities lower extremity dependent edema   Resolved Hospital Problem list    Sedation Needs on Mechanical Ventilation > resolved  Assessment & Plan:   Acute Hypoxemic Respiratory Failure secondary to Left PNA  COPD, not in exacerbation Aspiration at baseline> on nectar thick liquids for 3 years Requiring 4 L via nasal cannula Plan Mobilize Supplemental oxygen Continue Brovana/Pulmicort with as needed DuoNeb Aspiration/reflux precautions When mental status will support would resume nectar thick liquids Physical therapy consulted we will continue aggressive mobilization Today is day 5 azith and ceftriaxone. Will stop after 5 total doses of each (stop date placed)   AKI due to rhabdomyolysis > improving Making urine, serum creatinine continuing to improve Plan Continue to renal dose medications Strict intake output Can decrease LR Continue D5 water A.m. chemistry  Hypernatremia > multifactorial, won't take water at SNF due to nectar thick, unclear if he was getting enough attention at SNF to keep adequately hydrated- -he will not keep nasogastric tube in Plan Continue D5 water, his current free water deficit is 3.9 liters. Will aim to replace 1/2 of this w/ D5w @ 75 cc/hr q 12 H chemistry  Hyperglycemia  Hypoglycemia overnight 11/1 to 11/2-->started on D10 but lab draws suggest this was lab error Plan Dc d 10 Get glucose from vena puncture Glucose goal 140-180   At  Risk Malnutrition  Plan Need to resume diet when Mental status will allow   Difficult IV access Plan PICC today   Acute metabolic Encephalopathy superimposed on Dementia > unclear baseline, but daughter says that he is normally conversant but not oriented to year, situation; concern that his dementia may be more  advanced considering admission diagnosis of profound dehydration while under SNF care Plan Cont supportive care  History of stroke Plan Cont asa and plavix  ASA, plavix  Tobacco use Plan Counsel to quit    Goals of care: Dr Kendrick Fries discussed at length with daughter 11/2, she reported that his baseline mental status is far different than what we are seeing. SO hopefully with replacing free water we will see his mental status improve.  She is supportive of placing NG tube and restraints temporarily as needed. We were unable to get info from the SNF   Best practice:  Diet: tube feeding off as he keeps pulling out NGT Pain/Anxiety/Delirium protocol (if indicated): d/c VAP protocol (if indicated): d/c DVT prophylaxis: sub q heparin GI prophylaxis: famotidine Glucose control: monitor Mobility: bed rest Code Status: full Family Communication: Arica updated by phone on 11/2 Disposition: step down and triad. Biggest issue at this point remains correcting water deficit and the question to if this will correct his cognition to the point he can safely take POs again. Suspect that the presence of hypernatremia in an demented patient represents a fairly ominous sign.   Simonne Martinet ACNP-BC [facility] Pulmonary/Critical Care Pager # [phone] OR # [phone] if no answer

## 2020-05-27 NOTE — TOC Progression Note (Signed)
Transition of Care [facility]) - Progression Note    Patient Details  Name: Jose Schaefer MRN: [id number] Date of Birth: 23-Jun-1951  [facility] CM/SW Contact  Golda Acre, RN Phone Number: 05/27/2020, 7:34 AM  Clinical Narrative:    o2 at 4l/min [place],agitation-restraints applied, iv zithromax and iv rocephin, iv d10 at 75cc/hr, iv levophed for hypotension bp range=95/58-110/72. Na is 162 rec'ing free water =via ng tube. Pt is from a family care facility in [place] [facility].  May need snf placement.  Daughter is the owner of [facility] and is involved in the day to day care of the father. Following for progression.   Expected Discharge Plan: Skilled Nursing Facility ([facility]) Barriers to Discharge: Barriers Unresolved (comment) (ventilator and sepsis)  Expected Discharge Plan and Services Expected Discharge Plan: Skilled Nursing Facility ([facility])   Discharge Planning Services: CM Consult   Living arrangements for the past 2 months: Skilled Nursing Facility                                       Social Determinants of Health (SDOH) Interventions    Readmission Risk Interventions No flowsheet data found.

## 2020-05-27 NOTE — Progress Notes (Signed)
  Speech Language Pathology Treatment: Dysphagia  Patient Details Name: Jose Schaefer MRN: [id number] DOB: November 14, 1950 Today's Date: 05/27/2020 Time: 1105-1140 SLP Time Calculation (min) (ACUTE ONLY): 35 min  Assessment / Plan / Recommendation Clinical Impression  Pt with improved ability to manage secretions today and is able to follow some basic commands.  His voice remains severely dysphonic with weak nonproductive cough, ? increased facial asymmetry on the left - which daughter viewed.  Pt provided with single ice chips and tsps and single cup bolus of Ensure.  Pt demonstrates clinical indications of oral transiting deficits - ? lingual pumping and questionable delay in pharyngeal swallow.  No overt indication of aspiration with ice chips and tsps Ensure however with cup bolus, pt presented with cough post-swallow- non productive.   In discussion with pt's daughter Jose Schaefer, advised her that aspiration will not be prevented in this pt and she accepts this finding.  MBS planned to help evaluate oropharyngeal swallow ability and airway protection per daugher's wishes.  Would recommend to consider palliative consult to establish GOC given suspect progressive dysphagia.   RN and MD informed.  SLP introduced concept of frazier water protocl for this pt to maximize his hydration given pt refusal to consume thickened liquids.  Daughter agreeable to water protocol.    HPI HPI: Patient is a 69 y.o. male with PMH: dementia, CVA, COPD, tobacco abuse, who presented from SNF to [facility] with AMS. When EMS first evaluating patient, oxygen saturations were 75%. In ED, he was emergently intubated and a central line placed. CT Head was negative for acute intracranial abnormality. Patient had MBS in March of 2020, which reported moderate oropharyngeal dysphagia and recommended Dys 1, nectar thick liquids.  Pt required intubation from 10/30 - 11/1.  Pt has undergone clinical swallow evaluation with recommendations  for NPO due to mentation, wet voice and lack of clearance ability.  Daughter Jose Schaefer - present and reports she is his HCPOA.  She also admits he has premorbid dysphagia and coughs with solids therefore only consumes Ensures, puddings and applesauce.  Pt will not drink the liquid with thickener in it per daughter but he likes plain water.      SLP Plan  MBS       Recommendations  Diet recommendations: NPO (ice chips) Medication Administration: Via alternative means Supervision: Full supervision/cueing for compensatory strategies Compensations: Slow rate;Small sips/bites Postural Changes and/or Swallow Maneuvers: Seated upright 90 degrees;Upright 30-60 min after meal                Oral Care Recommendations: Oral care QID Follow up Recommendations: 24 hour supervision/assistance;Skilled Nursing facility SLP Visit Diagnosis: Dysphagia, oropharyngeal phase (R13.12) Plan: MBS       GO                Jose Schaefer 05/27/2020, 12:17 PM  Jose Infante, MS [facility] SLP Acute Rehab Services Office [phone] Pager [phone]

## 2020-05-28 DIAGNOSIS — J9601 Acute respiratory failure with hypoxia: Secondary | ICD-10-CM

## 2020-05-28 LAB — GLUCOSE, CAPILLARY
Glucose-Capillary: 102 mg/dL — ABNORMAL HIGH (ref 70–99)
Glucose-Capillary: 111 mg/dL — ABNORMAL HIGH (ref 70–99)
Glucose-Capillary: 116 mg/dL — ABNORMAL HIGH (ref 70–99)
Glucose-Capillary: 146 mg/dL — ABNORMAL HIGH (ref 70–99)
Glucose-Capillary: 228 mg/dL — ABNORMAL HIGH (ref 70–99)
Glucose-Capillary: 30 mg/dL — CL (ref 70–99)
Glucose-Capillary: <10 mg/dL — CL (ref 70–99)

## 2020-05-28 LAB — BASIC METABOLIC PANEL
Anion gap: 10 (ref 5–15)
Anion gap: 11 (ref 5–15)
BUN: 43 mg/dL — ABNORMAL HIGH (ref 8–23)
BUN: 38 mg/dL — ABNORMAL HIGH (ref 8–23)
CO2: 24 mmol/L (ref 22–32)
CO2: 21 mmol/L — ABNORMAL LOW (ref 22–32)
Calcium: 7.6 mg/dL — ABNORMAL LOW (ref 8.9–10.3)
Calcium: 7.4 mg/dL — ABNORMAL LOW (ref 8.9–10.3)
Chloride: 126 mmol/L — ABNORMAL HIGH (ref 98–111)
Chloride: 124 mmol/L — ABNORMAL HIGH (ref 98–111)
Creatinine, Ser: 2.56 mg/dL — ABNORMAL HIGH (ref 0.61–1.24)
Creatinine, Ser: 2.25 mg/dL — ABNORMAL HIGH (ref 0.61–1.24)
GFR, Estimated: 26 mL/min — ABNORMAL LOW (ref >60)
GFR, Estimated: 31 mL/min — ABNORMAL LOW (ref >60)
Glucose, Bld: 132 mg/dL — ABNORMAL HIGH (ref 70–99)
Glucose, Bld: 133 mg/dL — ABNORMAL HIGH (ref 70–99)
Potassium: 3.2 mmol/L — ABNORMAL LOW (ref 3.5–5.1)
Potassium: 3.6 mmol/L (ref 3.5–5.1)
Sodium: 160 mmol/L — ABNORMAL HIGH (ref 135–145)
Sodium: 156 mmol/L — ABNORMAL HIGH (ref 135–145)

## 2020-05-28 LAB — CULTURE, BLOOD (SINGLE): Culture: NO GROWTH

## 2020-05-28 MED ORDER — POTASSIUM CHLORIDE 10 MEQ/50ML IV SOLN
10.0000 meq | INTRAVENOUS | Status: AC
  Administered 2020-05-28 (×4): 10 meq via INTRAVENOUS
  Filled 2020-05-28 (×4): qty 50

## 2020-05-28 MED ORDER — SODIUM CHLORIDE 0.45 % IV SOLN: INTRAVENOUS | Status: DC

## 2020-05-28 NOTE — TOC Progression Note (Addendum)
Transition of Care [facility]) - Progression Note    Patient Details  Name: Jose Schaefer MRN: [id number] Date of Birth: 11-25-1950  [facility] CM/SW Contact  Golda Acre, RN Phone Number: 05/28/2020, 7:39 AM  Clinical Narrative:    tct-Arica -daughter pt has been vaccinated against Covid x2/ tct-Debbie at [facility] made aware that patient will want to come to [facility] when dcd which may be tomorrow or over the weekend depending on his labs. [facility] has made a bed offer daughter made aware of.  [place] with daughter would like for him to go to [place] or somewhere close.  [facility] in [place] [facility] in [place] or [place] are the top choices. Pt is suggesting snf for rehab for patient/ fl2 sent out to area snf including [place] and [place]. [facility] in [place] has declines along with [facility] Accepted is [facility], [facility], [facility] and [facility].  This l;ist given to daughter. passar [phone] [phone]  Expected Discharge Plan: Skilled Nursing Facility ([facility]) Barriers to Discharge: Barriers Unresolved (comment) (ventilator and sepsis)  Expected Discharge Plan and Services Expected Discharge Plan: Skilled Nursing Facility ([facility])   Discharge Planning Services: CM Consult   Living arrangements for the past 2 months: Skilled Nursing Facility                                       Social Determinants of Health (SDOH) Interventions    Readmission Risk Interventions No flowsheet data found.

## 2020-05-28 NOTE — NC FL2 (Signed)
[place] MEDICAID FL2 LEVEL OF CARE SCREENING TOOL     IDENTIFICATION  Patient Name: Jose Schaefer Birthdate: 05-10-1951 Sex: male Admission Date (Current Location): 05/23/2020  [facility] and [place] Number:  Geophysical data processor and Address:  [facility],  [address], [place] [address]      Provider Number: [id number]  Attending Physician Name and Address:  Alwyn Ren, MD  Relative Name and Phone Number:       Current Level of Care: Hospital Recommended Level of Care: Skilled Nursing Facility Prior Approval Number:    Date Approved/Denied:   PASRR Number: [id number]  Discharge Plan: SNF    Current Diagnoses: Patient Active Problem List   Diagnosis Date Noted  . Sepsis (HCC) 05/23/2020  . CAP (community acquired pneumonia) 05/23/2020  . Dementia (HCC) 08/27/2019  . AMS (altered mental status) 08/27/2019  . Altered mental status/Metabolic Encephalopathy due to Acute CVA 08/27/2019  . Aspiration pneumonia of right lung (HCC)   . Esophagitis   . Abnormal CT scan, esophagus   . Malnutrition of moderate degree 10/10/2018  . COPD exacerbation (HCC) 10/09/2018  . Dysphagia 10/09/2018  . Aspiration into airway 10/09/2018  . Lactic acidosis 10/09/2018  . Cerebral infarction due to unspecified mechanism   . CVA/-Small acute lacunar infarct in the left posterior lentiform/Internal Capsule 08/11/2015  . Cocaine abuse (HCC) 08/11/2015  . ARF (acute renal failure) (HCC) 08/11/2015  . Acute respiratory failure (HCC) 06/26/2011  . COPD (chronic obstructive pulmonary disease) with acute bronchitis (HCC) 06/26/2011  . Tobacco abuse   . Asthma     Orientation RESPIRATION BLADDER Height & Weight     Self  Normal Incontinent Weight: 48.6 kg Height:  5\' 8"  (172.7 cm)  BEHAVIORAL SYMPTOMS/MOOD NEUROLOGICAL BOWEL NUTRITION STATUS      Incontinent Diet (regular)  AMBULATORY STATUS COMMUNICATION OF NEEDS Skin   Extensive Assist Verbally Normal                        Personal Care Assistance Level of Assistance  Bathing, Feeding, Dressing Bathing Assistance: Limited assistance Feeding assistance: Limited assistance Dressing Assistance: Limited assistance     Functional Limitations Info  Sight, Hearing, Speech Sight Info: Adequate Hearing Info: Adequate Speech Info: Adequate    SPECIAL CARE FACTORS FREQUENCY  PT (By licensed PT), OT (By licensed OT)     PT Frequency: 5 x weekly OT Frequency: 5 x weekly            Contractures Contractures Info: Not present    Additional Factors Info  Code Status Code Status Info: full             Current Medications (05/28/2020):  This is the current hospital active medication list Current Facility-Administered Medications  Medication Dose Route Frequency Provider Last Rate Last Admin  . 0.45 % sodium chloride infusion   Intravenous Continuous 13/10/2019, MD 100 mL/hr at 05/28/20 0503 New Bag at 05/28/20 0503  . acetaminophen (TYLENOL) 160 MG/5ML solution 650 mg  650 mg Per Tube Q4H PRN 13/04/21, MD   650 mg at 05/24/20 1637  . arformoterol (BROVANA) nebulizer solution 15 mcg  15 mcg Nebulization BID 05/26/20, MD   15 mcg at 05/27/20 1930  . aspirin chewable tablet 81 mg  81 mg Per Tube Daily 13/03/21, MD   81 mg at 05/26/20 1202  . budesonide (PULMICORT) nebulizer solution 0.5 mg  0.5 mg Nebulization BID  Lupita Leash, MD   0.5 mg at 05/27/20 1932  . chlorhexidine (PERIDEX) 0.12 % solution 15 mL  15 mL Mouth Rinse BID Max Fickle B, MD   15 mL at 05/27/20 2107  . Chlorhexidine Gluconate Cloth 2 % PADS 6 each  6 each Topical Daily Lupita Leash, MD   6 each at 05/27/20 2107  . clopidogrel (PLAVIX) tablet 75 mg  75 mg Per Tube Daily Lupita Leash, MD   75 mg at 05/26/20 1202  . dextrose 5 % solution   Intravenous Continuous Lupita Leash, MD 40 mL/hr at 05/28/20 0503 Rate Verify at 05/28/20 0503  . docusate (COLACE) 50  MG/5ML liquid 100 mg  100 mg Per Tube BID Max Fickle B, MD   100 mg at 05/25/20 0954  . feeding supplement (OSMOLITE 1.2 CAL) liquid 1,000 mL  1,000 mL Per Tube Continuous Lupita Leash, MD   Stopped at 05/25/20 2230  . free water 200 mL  200 mL Per Tube Q3H Max Fickle B, MD   200 mL at 05/26/20 1203  . heparin injection 5,000 Units  5,000 Units Subcutaneous Q8H Lupita Leash, MD   5,000 Units at 05/28/20 0505  . ipratropium-albuterol (DUONEB) 0.5-2.5 (3) MG/3ML nebulizer solution 3 mL  3 mL Nebulization Q6H PRN Max Fickle B, MD   3 mL at 05/25/20 1958  . MEDLINE mouth rinse  15 mL Mouth Rinse q12n4p Max Fickle B, MD   15 mL at 05/27/20 1502  . potassium chloride 10 mEq in 50 mL *CENTRAL LINE* IVPB  10 mEq Intravenous Q1 Hr x 4 Karl Ito, MD   Stopped at 05/28/20 [phone]  . sodium chloride flush (NS) 0.9 % injection 10-40 mL  10-40 mL Intracatheter Q12H Max Fickle B, MD   10 mL at 05/27/20 2200  . sodium chloride flush (NS) 0.9 % injection 10-40 mL  10-40 mL Intracatheter PRN Lupita Leash, MD         Discharge Medications: Please see discharge summary for a list of discharge medications.  Relevant Imaging Results:  Relevant Lab Results:   Additional Information [id number]::[id number]  Golda Acre, RN

## 2020-05-28 NOTE — Progress Notes (Addendum)
eLink Physician-Brief Progress Note Patient Name: Jose Schaefer DOB: 07-Apr-1951 MRN: [id number]   Date of Service  05/28/2020  HPI/Events of Note  Multiple issues: 1. Hypokalemia - K+ = 3.2 and Creatinine = 2.56 and 2. Hypernatremia - Na+ = 152 --> 160. Patient does not have NGT to get free water that is ordered.   eICU Interventions  Plan: 1. Replace K+. 2. 0.45 NaCl to run IV at 100 mL/hour. 3. D/C LR IV infusion at 50 mL/hour.     Intervention Category Major Interventions: Electrolyte abnormality - evaluation and management  Virlan Kempker [place] 05/28/2020, 4:46 AM

## 2020-05-28 NOTE — Progress Notes (Signed)
°  Speech Language Pathology Treatment: Dysphagia  Patient Details Name: Tasman Zapata MRN: [id number] DOB: 1950/11/04 Today's Date: 05/28/2020 Time: 6962-9528 SLP Time Calculation (min) (ACUTE ONLY): 22 min  Assessment / Plan / Recommendation Clinical Impression  Today pt awake in bed, he is aphasic and has dysarthria from his prior CVA however is alert.  He allowed SLP to provide oral care to remove viscous secretions mostly from left side of tongue.  Small abrasion noted near tip of tongue - ? due to oral suction.  Pt continues with significant delay in swallow response despite verbal cues.  Overt coughing when swallowing Ensure via straw likely due to overt aspiration.  He tolerated ice cream, tsps Ensure and tsps water/ice chips without overt indication of aspiration.  Voice is intermittently wet - without clearance indication of secretion aspiration. Continue to recommend full liquids -via tsps, tsps thin, ice chips only with strict precautions.  His swallow is very delayed, thus you must assure he swallows before giving more.  His level of dysphagia will prevent him from obtaining adequate nutrition. Again, advise a palliative consult for this pt given his ongoing dysphagia that is currently exacerbated from current illness.    HPI HPI: Patient is a 69 y.o. male with PMH: dementia, CVA, COPD, tobacco abuse, who presented from SNF to [facility] with AMS. When EMS first evaluating patient, oxygen saturations were 75%. In ED, he was emergently intubated and a central line placed. CT Head was negative for acute intracranial abnormality. Patient had MBS in March of 2020, which reported moderate oropharyngeal dysphagia and recommended Dys 1, nectar thick liquids.  Pt required intubation from 10/30 - 11/1.  Pt has undergone clinical swallow evaluation with recommendations for NPO due to mentation, wet voice and lack of clearance ability.  Daughter [name] - present and reports she is his HCPOA.  She  also admits he has premorbid dysphagia and coughs with solids therefore only consumes Ensures, puddings and applesauce.  Pt will not drink the liquid with thickener in it per daughter but he likes plain water.  Pt underwent MBS yesterday and it was recommended he start a diet but his mentation has not allowed po intake per MD notes.      SLP Plan  Continue with current plan of care       Recommendations  Diet recommendations: Nectar-thick liquid;Thin liquid (ice chips, liquids via tsp only) Liquids provided via: Teaspoon (no cup, no straw) Medication Administration: Via alternative means Supervision: Full supervision/cueing for compensatory strategies Compensations: Slow rate;Small sips/bites Postural Changes and/or Swallow Maneuvers: Seated upright 90 degrees;Upright 30-60 min after meal                Oral Care Recommendations: Oral care QID Follow up Recommendations: 24 hour supervision/assistance;Skilled Nursing facility SLP Visit Diagnosis: Dysphagia, oropharyngeal phase (R13.12) Plan: Continue with current plan of care       GO                [name] 05/28/2020, 1:44 PM   [name], MS [facility] SLP Acute Rehab Services Office [phone] Pager [phone]

## 2020-05-28 NOTE — Progress Notes (Signed)
PROGRESS NOTE    Jose Schaefer  WER:[id number] DOB: November 03, 1950 DOA: 05/23/2020 PCP: [facility]   Brief Narrative: 69 year old male admitted with acute change in mental status likely secondary to acute hypoxic respiratory failure, AKI and profound hypernatremia.  He was found to have a sodium of 180 on admission with a creatinine of 4.27 and pneumonia.  He was admitted on 05/23/2020.  Patient lives at [facility] nursing home.  Patient was intubated in the ER and extubated 05/25/2020.  He has history of dementia, stroke, COPD. 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  He was Covid negative, influenza AMB negative.  He was treated with Rocephin and azithromycin. Seen by speech therapy patient is moderate aspiration risk And risk for inadequate nutrition and hydration.  Assessment & Plan:   Active Problems:   Sepsis (HCC)   CAP (community acquired pneumonia)   #1 severe hypernatremia due to decreased p.o. intake at the nursing home.  Patient also at high risk for aspiration on thickened water which she refuses to drink.  However his legal guardian's daughter wants [place] to keep pushing him to drink thickened water.  Discussed with her in detail.  Continue IV fluids while in hospital.  He is at high risk of getting recurrent hospital admissions.  #2  Status post acute hypoxic respiratory failure due to pneumonia-patient received Rocephin azithromycin for 5 days.  Continue Brovana and Pulmicort and aspiration precautions.   #3 AKI due to dehydration renal functions improving continue IV fluids.  #4 status post rhabdomyolysis continue IV fluids follow-up labs daily.  #5 acute metabolic encephalopathy in the setting of dementia and stroke, secondary to severe dehydration and hypernatremia. Patient's daughter reports he is normally awake alert walking talking which she is not at the moment.  Still not awake enough to take p.o. intake.       Nutrition Problem: Severe Malnutrition Etiology: chronic illness (CVA and dementia)     Signs/Symptoms: moderate fat depletion, moderate muscle depletion, severe fat depletion, severe muscle depletion    Interventions: Tube feeding  Estimated body mass index is 16.29 kg/m as calculated from the following:   Height as of this encounter: 5\' 8"  (1.727 m).   Weight as of this encounter: 48.6 kg.  DVT prophylaxis: Subcu heparin  code Status: Full code Family Communication: Discussed with daughter Disposition Plan:  Status is: Inpatient  Dispo: The patient is from: SNF              Anticipated d/c is to: SNF              Anticipated d/c date is: > 3 days              Patient currently is not medically stable to d/c.    Consultants:   PCCM  Procedures: Intubation and left femoral line Antimicrobials: Rocephin azithromycin finished a course of 5 days  Subjective: He is resting in bed when I called his name he opened his eyes and looked at me appears very weak cannot follow a conversation or follow commands  Objective: Vitals:   05/28/20 0800 05/28/20 0810 05/28/20 0812 05/28/20 0900  BP: 102/60   107/63  Pulse: 99   95  Resp: (!) 24   (!) 22  Temp:   99.8 F (37.7 C)   TempSrc:   Axillary   SpO2: 97% 96% 96% 96%  Weight:      Height:  Intake/Output Summary (Last 24 hours) at 05/28/2020 1004 Last data filed at 05/28/2020 0602 Gross per 24 hour  Intake 2060.2 ml  Output 1400 ml  Net 660.2 ml   Filed Weights   05/26/20 0500 05/27/20 0500 05/28/20 0500  Weight: 51.7 kg 50.2 kg 48.6 kg    Examination:  General exam: Appears chronically ill looking Respiratory system: Clear to auscultation. Respiratory effort normal. Cardiovascular system: S1 & S2 heard, RRR. No JVD, murmurs, rubs, gallops or clicks. No pedal edema. Gastrointestinal system: Abdomen is nondistended, soft and nontender. No organomegaly or masses felt. Normal bowel sounds  heard. Central nervous system: Awake  extremities: 1+ edema Skin: No rashes, lesions or ulcers Psychiatry: Unable to assess.     Data Reviewed: I have personally reviewed following labs and imaging studies  CBC: Recent Labs  Lab 05/23/20 1530 05/23/20 2245 05/24/20 0410 05/25/20 0345 05/27/20 0305  WBC 17.8* 21.1* 16.1* 15.2* 10.4  NEUTROABS 16.6* 19.1*  --   --  9.1*  HGB 12.6* 12.5* 9.8* 10.8* 10.4*  HCT 42.6 41.5 32.6* 35.6* 32.2*  MCV 95.1 93.5 93.9 92.0 88.0  PLT 177 176 145* 142* PLATELET CLUMPS NOTED ON SMEAR, UNABLE TO ESTIMATE   Basic Metabolic Panel: Recent Labs  Lab 05/23/20 2245 05/23/20 2245 05/24/20 0410 05/24/20 0410 05/24/20 1700 05/25/20 0345 05/26/20 0342 05/26/20 1001 05/26/20 1001 05/26/20 2242 05/27/20 0305 05/27/20 1036 05/27/20 1621 05/28/20 0255  NA >180*   < > 172*   < >  --  168*   < > 167*  --   --  162* 163* 152* 160*  K 4.6   < > 4.3   < >  --  4.2   < > 3.4*  --   --  3.8 3.6 3.0* 3.2*  CL >130*   < > >130*   < >  --  >130*   < > 130*  --   --  127* 126* 119* 126*  CO2 24   < > 24   < >  --  21*   < > 20*  --   --  26 23 23 24   GLUCOSE 171*   < > 234*   < >  --  267*   < > 184*   < > 118* 153* 103* 385* 132*  BUN 110*   < > 98*   < >  --  91*   < > 74*  --   --  58* 55* 44* 43*  CREATININE 5.02*   < > 4.51*   < >  --  4.36*   < > 3.51*  --   --  2.88* 2.75* 2.40* 2.56*  CALCIUM 9.3   < > 8.4*   < >  --  7.8*   < > 8.0*  --   --  7.9* 7.7* 7.2* 7.6*  MG 3.4*  --  2.8*  --  2.6* 2.6*  --   --   --   --   --   --   --   --   PHOS 7.7*  --  6.2*  --  5.5* 6.0*  --   --   --   --   --   --   --   --    < > = values in this interval not displayed.   GFR: Estimated Creatinine Clearance: 18.5 mL/min (A) (by C-G formula based on SCr of 2.56 mg/dL (H)). Liver Function Tests: Recent Labs  Lab 05/23/20  1530 05/23/20 2245 05/27/20 0305  AST 42* 47* 112*  ALT 21 24 69*  ALKPHOS 62 64 63  BILITOT 1.2 0.4 0.7  PROT 8.1 8.2* 6.1*   ALBUMIN 3.0* 3.0* 2.0*   No results for input(s): LIPASE, AMYLASE in the last 168 hours. No results for input(s): AMMONIA in the last 168 hours. Coagulation Profile: Recent Labs  Lab 05/23/20 1530  INR 1.5*   Cardiac Enzymes: Recent Labs  Lab 05/23/20 2245  CKTOTAL 2,469*   BNP (last 3 results) No results for input(s): PROBNP in the last 8760 hours. HbA1C: No results for input(s): HGBA1C in the last 72 hours. CBG: Recent Labs  Lab 05/27/20 1630 05/27/20 2008 05/28/20 0019 05/28/20 0818 05/28/20 0821  GLUCAP 114* 124* 116* 102* 146*   Lipid Profile: No results for input(s): CHOL, HDL, LDLCALC, TRIG, CHOLHDL, LDLDIRECT in the last 72 hours. Thyroid Function Tests: No results for input(s): TSH, T4TOTAL, FREET4, T3FREE, THYROIDAB in the last 72 hours. Anemia Panel: No results for input(s): VITAMINB12, FOLATE, FERRITIN, TIBC, IRON, RETICCTPCT in the last 72 hours. Sepsis Labs: Recent Labs  Lab 05/23/20 1530 05/23/20 1811 05/23/20 2245 05/24/20 0057  LATICACIDVEN 3.8* 2.3* 2.4* 1.9    Recent Results (from the past 240 hour(s))  Respiratory Panel by RT PCR (Flu A&B, Covid) - Nasopharyngeal Swab     Status: None   Collection Time: 05/23/20  1:46 PM   Specimen: Nasopharyngeal Swab  Result Value Ref Range Status   SARS Coronavirus 2 by RT PCR NEGATIVE NEGATIVE Final    Comment: (NOTE) SARS-CoV-2 target nucleic acids are NOT DETECTED.  The SARS-CoV-2 RNA is generally detectable in upper respiratoy specimens during the acute phase of infection. The lowest concentration of SARS-CoV-2 viral copies this assay can detect is 131 copies/mL. A negative result does not preclude SARS-Cov-2 infection and should not be used as the sole basis for treatment or other patient management decisions. A negative result may occur with  improper specimen collection/handling, submission of specimen other than nasopharyngeal swab, presence of viral mutation(s) within the areas targeted  by this assay, and inadequate number of viral copies (<131 copies/mL). A negative result must be combined with clinical observations, patient history, and epidemiological information. The expected result is Negative.  Fact Sheet for Patients:  https://www.moore.com/  Fact Sheet for Healthcare Providers:  https://www.young.biz/  This test is no t yet approved or cleared by the [place] FDA and  has been authorized for detection and/or diagnosis of SARS-CoV-2 by FDA under an Emergency Use Authorization (EUA). This EUA will remain  in effect (meaning this test can be used) for the duration of the COVID-19 declaration under Section 564(b)(1) of the Act, 21 U.S.C. section 360bbb-3(b)(1), unless the authorization is terminated or revoked sooner.     Influenza A by PCR NEGATIVE NEGATIVE Final   Influenza B by PCR NEGATIVE NEGATIVE Final    Comment: (NOTE) The Xpert Xpress SARS-CoV-2/FLU/RSV assay is intended as an aid in  the diagnosis of influenza from Nasopharyngeal swab specimens and  should not be used as a sole basis for treatment. Nasal washings and  aspirates are unacceptable for Xpert Xpress SARS-CoV-2/FLU/RSV  testing.  Fact Sheet for Patients: https://www.moore.com/  Fact Sheet for Healthcare Providers: https://www.young.biz/  This test is not yet approved or cleared by the [place] FDA and  has been authorized for detection and/or diagnosis of SARS-CoV-2 by  FDA under an Emergency Use Authorization (EUA). This EUA will remain  in effect (meaning this test  can be used) for the duration of the  Covid-19 declaration under Section 564(b)(1) of the Act, 21  U.S.C. section 360bbb-3(b)(1), unless the authorization is  terminated or revoked. Performed at [facility], [address], [place], [place] [address]   Culture, blood (single)     Status: None (Preliminary result)   Collection Time:  05/23/20  3:30 PM   Specimen: BLOOD  Result Value Ref Range Status   Specimen Description BLOOD  Final   Special Requests NONE  Final   Culture   Final    NO GROWTH 4 DAYS Performed at [facility], [address], [place], [place] [address]    Report Status PENDING  Incomplete  MRSA PCR Screening     Status: None   Collection Time: 05/23/20 11:27 PM   Specimen: Nasopharyngeal  Result Value Ref Range Status   MRSA by PCR NEGATIVE NEGATIVE Final    Comment:        The GeneXpert MRSA Assay (FDA approved for NASAL specimens only), is one component of a comprehensive MRSA colonization surveillance program. It is not intended to diagnose MRSA infection nor to guide or monitor treatment for MRSA infections. Performed at [facility], [address], [place], [place] [address]          Radiology Studies: DG Abd 1 View  Result Date: 05/26/2020 CLINICAL DATA:  Feeding tube placement. EXAM: ABDOMEN - 1 VIEW COMPARISON:  05/25/2020. FINDINGS: Feeding tube noted with tip over the distal stomach/proximal duodenum. No bowel distention. No free air. Left base infiltrate cannot be excluded. Degenerative change lumbar spine and both hips. IMPRESSION: Feeding tube noted with tip over the distal stomach/proximal duodenum. Electronically Signed   By: Maisie Fus  Register   On: 05/26/2020 11:02   DG Swallowing Func-Speech Pathology  Result Date: 05/27/2020 Objective Swallowing Evaluation: Type of Study: MBS-Modified Barium Swallow Study  Patient Details Name: Nahsir Venezia MRN: [id number] Date of Birth: 02/06/1951 Today's Date: 05/27/2020 Time: SLP Start Time (ACUTE ONLY): 1405 -SLP Stop Time (ACUTE ONLY): 1437 SLP Time Calculation (min) (ACUTE ONLY): 32 min Past Medical History: Past Medical History: Diagnosis Date . Asthma  . COPD (chronic obstructive pulmonary disease) (HCC)  . CVA (cerebral vascular accident) [facility]) 2001  [place] . Dementia (HCC)  . Tobacco abuse  Past Surgical  History: Past Surgical History: Procedure Laterality Date . ESOPHAGEAL DILATION N/A 10/13/2018  Procedure: ESOPHAGEAL DILATION;  Surgeon: West Bali, MD;  Location: [facility];  Service: Endoscopy;  Laterality: N/A; . ESOPHAGOGASTRODUODENOSCOPY N/A 10/13/2018  Procedure: ESOPHAGOGASTRODUODENOSCOPY (EGD);  Surgeon: West Bali, MD;  Location: [facility];  Service: Endoscopy;  Laterality: N/A; HPI: Patient is a 69 y.o. male with PMH: dementia, CVA, COPD, tobacco abuse, who presented from SNF to [facility] with AMS. When EMS first evaluating patient, oxygen saturations were 75%. In ED, he was emergently intubated and a central line placed. CT Head was negative for acute intracranial abnormality. Patient had MBS in March of 2020, which reported moderate oropharyngeal dysphagia and recommended Dys 1, nectar thick liquids.  Pt required intubation from 10/30 - 11/1.  Pt has undergone clinical swallow evaluation with recommendations for NPO due to mentation, wet voice and lack of clearance ability.  Daughter [name] - present and reports she is his HCPOA.  She also admits he has premorbid dysphagia and coughs with solids therefore only consumes Ensures, puddings and applesauce.  Pt will not drink the liquid with thickener in it per daughter but he  likes plain water.  Subjective: alert, verbalizing some but voice very weak Assessment / Plan / Recommendation CHL IP CLINICAL IMPRESSIONS 05/27/2020 Clinical Impression Pt presents with oral more than pharyngeal sensorimotor dysphagia mostly c/b delayed and discoordinated oral transiting due to weakness.  Lingual pumping present with premature spillage of boluses into pharynx.  Verbal cues "1, 2, 3, swallow" appeared helpful in improving timing of swallow. This oral control issue allows spillage of thin barium into trachea prior to swallow trigger.  Chin tuck posture was not tested due to extent of oral deficits.  Pharyngeal swallow was minimally weak with  decreased laryngeal closure and tongue base retraction.  Aspiration of thin was noted and was trace but did produce overt coughing.  No aspiration of nectar, pudding, nor cracker bolus apparent.  Pt will remain elevated aspiration and malnutrition risk due to his delay in swallowing and weak cough.  Given he premorbidly consumed mostly Ensures, puddings, applesauce, etc only - recommend to initiate either full liquid (nectar) vs puree/nectar and allow free water protocol between meals to maximize hydration, QOL and comfort.  Recommend pt be allowed tsps thin and ice chips any time.  Will follow for family and pt education.  Pt's level of dysphagia will increase his risk of malnutrition due to his delays/laborious feeding. SLP Visit Diagnosis Dysphagia, oropharyngeal phase (R13.12) Attention and concentration deficit following -- Frontal lobe and executive function deficit following -- Impact on safety and function Moderate aspiration risk;Risk for inadequate nutrition/hydration   CHL IP TREATMENT RECOMMENDATION 05/27/2020 Treatment Recommendations Therapy as outlined in treatment plan below   Prognosis 05/27/2020 Prognosis for Safe Diet Advancement Fair Barriers to Reach Goals Time post onset;Cognitive deficits Barriers/Prognosis Comment -- CHL IP DIET RECOMMENDATION 05/27/2020 SLP Diet Recommendations Nectar thick liquid;Dysphagia 1 (Puree) solids Liquid Administration via Cup Medication Administration Crushed with puree Compensations Slow rate;Small sips/bites;Other (Comment) Postural Changes --   CHL IP OTHER RECOMMENDATIONS 05/27/2020 Recommended Consults -- Oral Care Recommendations Oral care QID Other Recommendations Order thickener from pharmacy;Have oral suction available   CHL IP FOLLOW UP RECOMMENDATIONS 05/27/2020 Follow up Recommendations 24 hour supervision/assistance;Skilled Nursing facility   CHL IP FREQUENCY AND DURATION 05/27/2020 Speech Therapy Frequency (ACUTE ONLY) min 2x/week Treatment Duration 2  weeks      CHL IP ORAL PHASE 05/27/2020 Oral Phase Impaired Oral - Pudding Teaspoon -- Oral - Pudding Cup -- Oral - Honey Teaspoon -- Oral - Honey Cup -- Oral - Nectar Teaspoon Lingual pumping;Delayed oral transit;Weak lingual manipulation;Premature spillage Oral - Nectar Cup Lingual pumping;Premature spillage;Delayed oral transit;Weak lingual manipulation Oral - Nectar Straw Lingual pumping;Premature spillage;Delayed oral transit;Weak lingual manipulation;Decreased bolus cohesion Oral - Thin Teaspoon Premature spillage;Weak lingual manipulation;Delayed oral transit Oral - Thin Cup Delayed oral transit;Decreased bolus cohesion;Premature spillage;Weak lingual manipulation;Lingual pumping Oral - Thin Straw -- Oral - Puree Premature spillage;Decreased bolus cohesion;Weak lingual manipulation Oral - Mech Soft Impaired mastication;Weak lingual manipulation;Reduced posterior propulsion;Lingual pumping;Delayed oral transit;Premature spillage;Decreased bolus cohesion Oral - Regular -- Oral - Multi-Consistency -- Oral - Pill -- Oral Phase - Comment pt appeared to benefit from SLP verbalizing "1,2,3" to aid in oral transiting timing coordination (although inconsistent); initial delay with transiting was 40 seconds with nectar tsp, 13 seconds with puree , six seconds with thin, impaired oral control allows premature spillage of barium into pharynx and into larynx with thin, lingual pumping and delay noted across all consistencies which will increase his malnutrition and aspiration risk  CHL IP PHARYNGEAL PHASE 05/27/2020 Pharyngeal Phase Impaired Pharyngeal- Pudding Teaspoon --  Pharyngeal -- Pharyngeal- Pudding Cup -- Pharyngeal -- Pharyngeal- Honey Teaspoon -- Pharyngeal -- Pharyngeal- Honey Cup -- Pharyngeal -- Pharyngeal- Nectar Teaspoon Delayed swallow initiation-vallecula;Pharyngeal residue - valleculae Pharyngeal Material does not enter airway Pharyngeal- Nectar Cup Pharyngeal residue - valleculae Pharyngeal -- Pharyngeal-  Nectar Straw -- Pharyngeal Material does not enter airway Pharyngeal- Thin Teaspoon Penetration/Aspiration during swallow;Delayed swallow initiation-vallecula;Reduced laryngeal elevation;Reduced airway/laryngeal closure Pharyngeal Material enters airway, remains ABOVE vocal cords and not ejected out Pharyngeal- Thin Cup Penetration/Aspiration during swallow;Reduced airway/laryngeal closure;Reduced laryngeal elevation;Reduced tongue base retraction;Trace aspiration Pharyngeal Material enters airway, passes BELOW cords and not ejected out despite cough attempt by patient Pharyngeal- Thin Straw Penetration/Aspiration before swallow;Penetration/Aspiration during swallow;Trace aspiration Pharyngeal Material enters airway, CONTACTS cords and then ejected out;Material enters airway, passes BELOW cords and not ejected out despite cough attempt by patient Pharyngeal- Puree Delayed swallow initiation-vallecula Pharyngeal -- Pharyngeal- Mechanical Soft Delayed swallow initiation-vallecula;Pharyngeal residue - valleculae Pharyngeal Material does not enter airway Pharyngeal- Regular -- Pharyngeal -- Pharyngeal- Multi-consistency -- Pharyngeal -- Pharyngeal- Pill -- Pharyngeal -- Pharyngeal Comment Reflexive cough response x2 during MBS with mild aspiration; aspirates were not cleared with reflexive nor cued cough; Pt did not conduct dry swallow on cue and attempts to "hock" - forcibly expectectorate did not clear mild vallecular retention; SLP advised pt to continue to work on strenghtening "hock" to clear pharynx  CHL IP CERVICAL ESOPHAGEAL PHASE 05/27/2020 Cervical Esophageal Phase [facility]  Upon esophageal sweep, pt appeared clear! Radiologist not present to confirm.  Pudding Teaspoon -- Pudding Cup -- Honey Teaspoon -- Honey Cup -- Nectar Teaspoon -- Nectar Cup -- Nectar Straw -- Thin Teaspoon -- Thin Cup -- Thin Straw -- Puree -- Mechanical Soft -- Regular -- Multi-consistency -- Pill -- Cervical Esophageal Comment -- [name], MS  [facility] SLP Acute Rehab Services Office [phone] Pager [phone] [name] 05/27/2020, 3:44 PM              [place] EKG SITE RITE  Result Date: 05/27/2020 If Site Rite image not attached, placement could not be confirmed due to current cardiac rhythm.       Scheduled Meds: . arformoterol  15 mcg Nebulization BID  . aspirin  81 mg Per Tube Daily  . budesonide (PULMICORT) nebulizer solution  0.5 mg Nebulization BID  . chlorhexidine  15 mL Mouth Rinse BID  . Chlorhexidine Gluconate Cloth  6 each Topical Daily  . clopidogrel  75 mg Per Tube Daily  . docusate  100 mg Per Tube BID  . heparin  5,000 Units Subcutaneous Q8H  . mouth rinse  15 mL Mouth Rinse q12n4p  . sodium chloride flush  10-40 mL Intracatheter Q12H   Continuous Infusions: . sodium chloride 100 mL/hr at 05/28/20 0503  . dextrose 40 mL/hr at 05/28/20 0916     LOS: 5 days     Alwyn Ren, MD  05/28/2020, 10:04 AM

## 2020-05-29 LAB — COMPREHENSIVE METABOLIC PANEL
ALT: 61 U/L — ABNORMAL HIGH (ref 0–44)
AST: 83 U/L — ABNORMAL HIGH (ref 15–41)
Albumin: 2 g/dL — ABNORMAL LOW (ref 3.5–5.0)
Alkaline Phosphatase: 65 U/L (ref 38–126)
Anion gap: 15 (ref 5–15)
BUN: 31 mg/dL — ABNORMAL HIGH (ref 8–23)
CO2: 22 mmol/L (ref 22–32)
Calcium: 7.3 mg/dL — ABNORMAL LOW (ref 8.9–10.3)
Chloride: 118 mmol/L — ABNORMAL HIGH (ref 98–111)
Creatinine, Ser: 2.02 mg/dL — ABNORMAL HIGH (ref 0.61–1.24)
GFR, Estimated: 35 mL/min — ABNORMAL LOW (ref >60)
Glucose, Bld: 121 mg/dL — ABNORMAL HIGH (ref 70–99)
Potassium: 3.2 mmol/L — ABNORMAL LOW (ref 3.5–5.1)
Sodium: 155 mmol/L — ABNORMAL HIGH (ref 135–145)
Total Bilirubin: 0.8 mg/dL (ref 0.3–1.2)
Total Protein: 6 g/dL — ABNORMAL LOW (ref 6.5–8.1)

## 2020-05-29 LAB — GLUCOSE, CAPILLARY
Glucose-Capillary: 115 mg/dL — ABNORMAL HIGH (ref 70–99)
Glucose-Capillary: 126 mg/dL — ABNORMAL HIGH (ref 70–99)
Glucose-Capillary: 14 mg/dL — CL (ref 70–99)
Glucose-Capillary: 152 mg/dL — ABNORMAL HIGH (ref 70–99)
Glucose-Capillary: 72 mg/dL (ref 70–99)
Glucose-Capillary: 98 mg/dL (ref 70–99)
Glucose-Capillary: 99 mg/dL (ref 70–99)
Glucose-Capillary: <10 mg/dL — CL (ref 70–99)

## 2020-05-29 LAB — MAGNESIUM: Magnesium: 1.8 mg/dL (ref 1.7–2.4)

## 2020-05-29 MED ORDER — POTASSIUM CHLORIDE 10 MEQ/100ML IV SOLN: 10.0000 meq | INTRAVENOUS | Status: DC

## 2020-05-29 MED ORDER — POTASSIUM CHLORIDE CRYS ER 20 MEQ PO TBCR: 40.0000 meq | EXTENDED_RELEASE_TABLET | Freq: Once | ORAL | Status: DC

## 2020-05-29 MED ORDER — POTASSIUM CHLORIDE 2 MEQ/ML IV SOLN
Freq: Once | INTRAVENOUS | Status: AC
  Filled 2020-05-29: qty 500

## 2020-05-29 NOTE — Progress Notes (Signed)
PROGRESS NOTE    Jose Schaefer  WJX:[id number] DOB: 09/02/1950 DOA: 05/23/2020 PCP: [facility]   Brief Narrative: 69 year old male admitted with acute change in mental status likely secondary to acute hypoxic respiratory failure, AKI and profound hypernatremia.  He was found to have a sodium of 180 on admission with a creatinine of 4.27 and pneumonia.  He was admitted on 05/23/2020.  Patient lives at [facility] nursing home.  Patient was intubated in the ER and extubated 05/25/2020.  He has history of dementia, stroke, COPD. 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  He was Covid negative, influenza AMB negative.  He was treated with Rocephin and azithromycin. Seen by speech therapy patient is moderate aspiration risk And risk for inadequate nutrition and hydration.  Assessment & Plan:   Active Problems:   Sepsis (HCC)   CAP (community acquired pneumonia)   #1 severe hypernatremia due to decreased p.o. intake at the nursing home.  Patient also at high risk for aspiration on thickened water which he refuses to drink.  However his legal guardian's daughter wants [place] to keep pushing him to drink thickened water.  Discussed with her in detail.  Continue IV fluids while in hospital.  He is at high risk of getting recurrent hospital admissions. Sodium improved but still high at 155.  Potassium 3.2 replete. Continue D5 DC half-normal saline follow-up labs in a.m.  #2  Status post acute hypoxic respiratory failure due to pneumonia-patient received Rocephin azithromycin for 5 days.  Continue Brovana and Pulmicort and aspiration precautions.   #3 AKI due to dehydration renal functions improving continue IV fluids.  #4 status post rhabdomyolysis continue IV fluids follow-up labs daily.  #5 acute metabolic encephalopathy in the setting of dementia and stroke, secondary to severe dehydration and hypernatremia. Patient's daughter reports  he is normally awake alert walking talking which she is not at the moment.  Still not awake enough to take p.o. intake.   #6 thrombocytopenia check CBC in a.m. on heparin  #7 hypokalemia replete check magnesium     Nutrition Problem: Severe Malnutrition Etiology: chronic illness (CVA and dementia)     Signs/Symptoms: moderate fat depletion, moderate muscle depletion, severe fat depletion, severe muscle depletion    Interventions: Tube feeding  Estimated body mass index is 16.43 kg/m as calculated from the following:   Height as of this encounter:  (1.727 m).   Weight as of this encounter: 49 kg.  DVT prophylaxis: Subcu heparin  code Status: Full code Family Communication: Discussed with daughter Disposition Plan:  Status is: Inpatient  Dispo: The patient is from: SNF              Anticipated d/c is to: SNF              Anticipated d/c date is: > 3 days              Patient currently is not medically stable to d/c.    Consultants:   PCCM  Procedures: Intubation and left femoral line Antimicrobials: Rocephin azithromycin finished a course of 5 days  Subjective: He is resting in bed with eyes open trying to communicate notes his head up and down appears extremely weak  Objective: Vitals:   05/29/20 0500 05/29/20 0600 05/29/20 0700 05/29/20 0747  BP:  (!) 112/54 (!) 135/92   Pulse:   96   Resp:  (!) 21 (!) 23   Temp:    98 F (  36.7 C)  TempSrc:    Axillary  SpO2:   (!) 88% 100%  Weight: 49 kg     Height:        Intake/Output Summary (Last 24 hours) at 05/29/2020 1058 Last data filed at 05/29/2020 0600 Gross per 24 hour  Intake 2285.44 ml  Output 200 ml  Net 2085.44 ml   Filed Weights   05/27/20 0500 05/28/20 0500 05/29/20 0500  Weight: 50.2 kg 48.6 kg 49 kg    Examination:  General exam: Appears chronically ill looking Respiratory system: Clear to auscultation. Respiratory effort normal. Cardiovascular system: S1 & S2 heard, RRR. No JVD,  murmurs, rubs, gallops or clicks. No pedal edema. Gastrointestinal system: Abdomen is nondistended, soft and nontender. No organomegaly or masses felt. Normal bowel sounds heard. Central nervous system: Awake  extremities: 1+ edema Skin: No rashes, lesions or ulcers Psychiatry: Unable to assess.     Data Reviewed: I have personally reviewed following labs and imaging studies  CBC: Recent Labs  Lab 05/23/20 1530 05/23/20 2245 05/24/20 0410 05/25/20 0345 05/27/20 0305  WBC 17.8* 21.1* 16.1* 15.2* 10.4  NEUTROABS 16.6* 19.1*  --   --  9.1*  HGB 12.6* 12.5* 9.8* 10.8* 10.4*  HCT 42.6 41.5 32.6* 35.6* 32.2*  MCV 95.1 93.5 93.9 92.0 88.0  PLT 177 176 145* 142* PLATELET CLUMPS NOTED ON SMEAR, UNABLE TO ESTIMATE   Basic Metabolic Panel: Recent Labs  Lab 05/23/20 2245 05/23/20 2245 05/24/20 0410 05/24/20 0410 05/24/20 1700 05/25/20 0345 05/26/20 0342 05/27/20 1036 05/27/20 1621 05/28/20 0255 05/28/20 1730 05/29/20 0303  NA >180*   < > 172*   < >  --  168*   < > 163* 152* 160* 156* 155*  K 4.6   < > 4.3   < >  --  4.2   < > 3.6 3.0* 3.2* 3.6 3.2*  CL >130*   < > >130*   < >  --  >130*   < > 126* 119* 126* 124* 118*  CO2 24   < > 24   < >  --  21*   < > 23 23 24  21* 22  GLUCOSE 171*   < > 234*   < >  --  267*   < > 103* 385* 132* 133* 121*  BUN 110*   < > 98*   < >  --  91*   < > 55* 44* 43* 38* 31*  CREATININE 5.02*   < > 4.51*   < >  --  4.36*   < > 2.75* 2.40* 2.56* 2.25* 2.02*  CALCIUM 9.3   < > 8.4*   < >  --  7.8*   < > 7.7* 7.2* 7.6* 7.4* 7.3*  MG 3.4*  --  2.8*  --  2.6* 2.6*  --   --   --   --   --   --   PHOS 7.7*  --  6.2*  --  5.5* 6.0*  --   --   --   --   --   --    < > = values in this interval not displayed.   GFR: Estimated Creatinine Clearance: 23.6 mL/min (A) (by C-G formula based on SCr of 2.02 mg/dL (H)). Liver Function Tests: Recent Labs  Lab 05/23/20 1530 05/23/20 2245 05/27/20 0305 05/29/20 0303  AST 42* 47* 112* 83*  ALT 21 24 69* 61*    ALKPHOS 62 64 63 65  BILITOT 1.2 0.4 0.7  0.8  PROT 8.1 8.2* 6.1* 6.0*  ALBUMIN 3.0* 3.0* 2.0* 2.0*   No results for input(s): LIPASE, AMYLASE in the last 168 hours. No results for input(s): AMMONIA in the last 168 hours. Coagulation Profile: Recent Labs  Lab 05/23/20 1530  INR 1.5*   Cardiac Enzymes: Recent Labs  Lab 05/23/20 2245  CKTOTAL 2,469*   BNP (last 3 results) No results for input(s): PROBNP in the last 8760 hours. HbA1C: No results for input(s): HGBA1C in the last 72 hours. CBG: Recent Labs  Lab 05/28/20 0821 05/28/20 1730 05/28/20 1945 05/29/20 0016 05/29/20 0757  GLUCAP 146* 111* 228* 72 99   Lipid Profile: No results for input(s): CHOL, HDL, LDLCALC, TRIG, CHOLHDL, LDLDIRECT in the last 72 hours. Thyroid Function Tests: No results for input(s): TSH, T4TOTAL, FREET4, T3FREE, THYROIDAB in the last 72 hours. Anemia Panel: No results for input(s): VITAMINB12, FOLATE, FERRITIN, TIBC, IRON, RETICCTPCT in the last 72 hours. Sepsis Labs: Recent Labs  Lab 05/23/20 1530 05/23/20 1811 05/23/20 2245 05/24/20 0057  LATICACIDVEN 3.8* 2.3* 2.4* 1.9    Recent Results (from the past 240 hour(s))  Respiratory Panel by RT PCR (Flu A&B, Covid) - Nasopharyngeal Swab     Status: None   Collection Time: 05/23/20  1:46 PM   Specimen: Nasopharyngeal Swab  Result Value Ref Range Status   SARS Coronavirus 2 by RT PCR NEGATIVE NEGATIVE Final    Comment: (NOTE) SARS-CoV-2 target nucleic acids are NOT DETECTED.  The SARS-CoV-2 RNA is generally detectable in upper respiratoy specimens during the acute phase of infection. The lowest concentration of SARS-CoV-2 viral copies this assay can detect is 131 copies/mL. A negative result does not preclude SARS-Cov-2 infection and should not be used as the sole basis for treatment or other patient management decisions. A negative result may occur with  improper specimen collection/handling, submission of specimen other than  nasopharyngeal swab, presence of viral mutation(s) within the areas targeted by this assay, and inadequate number of viral copies (<131 copies/mL). A negative result must be combined with clinical observations, patient history, and epidemiological information. The expected result is Negative.  Fact Sheet for Patients:  https://www.moore.com/  Fact Sheet for Healthcare Providers:  https://www.young.biz/  This test is no t yet approved or cleared by the [place] FDA and  has been authorized for detection and/or diagnosis of SARS-CoV-2 by FDA under an Emergency Use Authorization (EUA). This EUA will remain  in effect (meaning this test can be used) for the duration of the COVID-19 declaration under Section 564(b)(1) of the Act, 21 U.S.C. section 360bbb-3(b)(1), unless the authorization is terminated or revoked sooner.     Influenza A by PCR NEGATIVE NEGATIVE Final   Influenza B by PCR NEGATIVE NEGATIVE Final    Comment: (NOTE) The Xpert Xpress SARS-CoV-2/FLU/RSV assay is intended as an aid in  the diagnosis of influenza from Nasopharyngeal swab specimens and  should not be used as a sole basis for treatment. Nasal washings and  aspirates are unacceptable for Xpert Xpress SARS-CoV-2/FLU/RSV  testing.  Fact Sheet for Patients: https://www.moore.com/  Fact Sheet for Healthcare Providers: https://www.young.biz/  This test is not yet approved or cleared by the [place] FDA and  has been authorized for detection and/or diagnosis of SARS-CoV-2 by  FDA under an Emergency Use Authorization (EUA). This EUA will remain  in effect (meaning this test can be used) for the duration of the  Covid-19 declaration under Section 564(b)(1) of the Act, 21  U.S.C. section 360bbb-3(b)(1), unless  the authorization is  terminated or revoked. Performed at [facility], [address] [place], [place] [address]     Culture, blood (single)     Status: None   Collection Time: 05/23/20  3:30 PM   Specimen: BLOOD  Result Value Ref Range Status   Specimen Description BLOOD  Final   Special Requests NONE  Final   Culture   Final    NO GROWTH 5 DAYS Performed at [facility], [address] [place], [place] [address]    Report Status 05/28/2020 FINAL  Final  MRSA PCR Screening     Status: None   Collection Time: 05/23/20 11:27 PM   Specimen: Nasopharyngeal  Result Value Ref Range Status   MRSA by PCR NEGATIVE NEGATIVE Final    Comment:        The GeneXpert MRSA Assay (FDA approved for NASAL specimens only), is one component of a comprehensive MRSA colonization surveillance program. It is not intended to diagnose MRSA infection nor to guide or monitor treatment for MRSA infections. Performed at [facility], [address] [place], [place] [address]          Radiology Studies: DG Swallowing Func-Speech Pathology  Result Date: 05/27/2020 Objective Swallowing Evaluation: Type of Study: MBS-Modified Barium Swallow Study  Patient Details Name: Jose Schaefer MRN: [id number] Date of Birth: 10/27/1950 Today's Date: 05/27/2020 Time: SLP Start Time (ACUTE ONLY): 1405 -SLP Stop Time (ACUTE ONLY): 1437 SLP Time Calculation (min) (ACUTE ONLY): 32 min Past Medical History: Past Medical History: Diagnosis Date . Asthma  . COPD (chronic obstructive pulmonary disease) (HCC)  . CVA (cerebral vascular accident) [facility]) 2001  [place] . Dementia (HCC)  . Tobacco abuse  Past Surgical History: Past Surgical History: Procedure Laterality Date . ESOPHAGEAL DILATION N/A 10/13/2018  Procedure: ESOPHAGEAL DILATION;  Surgeon: West Bali, MD;  Location: [facility];  Service: Endoscopy;  Laterality: N/A; . ESOPHAGOGASTRODUODENOSCOPY N/A 10/13/2018  Procedure: ESOPHAGOGASTRODUODENOSCOPY (EGD);  Surgeon: West Bali, MD;  Location: [facility];  Service: Endoscopy;  Laterality: N/A; HPI: Patient is a  69 y.o. male with PMH: dementia, CVA, COPD, tobacco abuse, who presented from SNF to [facility] with AMS. When EMS first evaluating patient, oxygen saturations were 75%. In ED, he was emergently intubated and a central line placed. CT Head was negative for acute intracranial abnormality. Patient had MBS in March of 2020, which reported moderate oropharyngeal dysphagia and recommended Dys 1, nectar thick liquids.  Pt required intubation from 10/30 - 11/1.  Pt has undergone clinical swallow evaluation with recommendations for NPO due to mentation, wet voice and lack of clearance ability.  Daughter [name] - present and reports she is his HCPOA.  She also admits he has premorbid dysphagia and coughs with solids therefore only consumes Ensures, puddings and applesauce.  Pt will not drink the liquid with thickener in it per daughter but he likes plain water.  Subjective: alert, verbalizing some but voice very weak Assessment / Plan / Recommendation CHL IP CLINICAL IMPRESSIONS 05/27/2020 Clinical Impression Pt presents with oral more than pharyngeal sensorimotor dysphagia mostly c/b delayed and discoordinated oral transiting due to weakness.  Lingual pumping present with premature spillage of boluses into pharynx.  Verbal cues "1, 2, 3, swallow" appeared helpful in improving timing of swallow. This oral control issue allows spillage of thin barium into trachea prior to swallow trigger.  Chin tuck posture was not tested due to extent of oral deficits.  Pharyngeal swallow was minimally  weak with decreased laryngeal closure and tongue base retraction.  Aspiration of thin was noted and was trace but did produce overt coughing.  No aspiration of nectar, pudding, nor cracker bolus apparent.  Pt will remain elevated aspiration and malnutrition risk due to his delay in swallowing and weak cough.  Given he premorbidly consumed mostly Ensures, puddings, applesauce, etc only - recommend to initiate either full liquid (nectar)  vs puree/nectar and allow free water protocol between meals to maximize hydration, QOL and comfort.  Recommend pt be allowed tsps thin and ice chips any time.  Will follow for family and pt education.  Pt's level of dysphagia will increase his risk of malnutrition due to his delays/laborious feeding. SLP Visit Diagnosis Dysphagia, oropharyngeal phase (R13.12) Attention and concentration deficit following -- Frontal lobe and executive function deficit following -- Impact on safety and function Moderate aspiration risk;Risk for inadequate nutrition/hydration   CHL IP TREATMENT RECOMMENDATION 05/27/2020 Treatment Recommendations Therapy as outlined in treatment plan below   Prognosis 05/27/2020 Prognosis for Safe Diet Advancement Fair Barriers to Reach Goals Time post onset;Cognitive deficits Barriers/Prognosis Comment -- CHL IP DIET RECOMMENDATION 05/27/2020 SLP Diet Recommendations Nectar thick liquid;Dysphagia 1 (Puree) solids Liquid Administration via Cup Medication Administration Crushed with puree Compensations Slow rate;Small sips/bites;Other (Comment) Postural Changes --   CHL IP OTHER RECOMMENDATIONS 05/27/2020 Recommended Consults -- Oral Care Recommendations Oral care QID Other Recommendations Order thickener from pharmacy;Have oral suction available   CHL IP FOLLOW UP RECOMMENDATIONS 05/27/2020 Follow up Recommendations 24 hour supervision/assistance;Skilled Nursing facility   CHL IP FREQUENCY AND DURATION 05/27/2020 Speech Therapy Frequency (ACUTE ONLY) min 2x/week Treatment Duration 2 weeks      CHL IP ORAL PHASE 05/27/2020 Oral Phase Impaired Oral - Pudding Teaspoon -- Oral - Pudding Cup -- Oral - Honey Teaspoon -- Oral - Honey Cup -- Oral - Nectar Teaspoon Lingual pumping;Delayed oral transit;Weak lingual manipulation;Premature spillage Oral - Nectar Cup Lingual pumping;Premature spillage;Delayed oral transit;Weak lingual manipulation Oral - Nectar Straw Lingual pumping;Premature spillage;Delayed oral  transit;Weak lingual manipulation;Decreased bolus cohesion Oral - Thin Teaspoon Premature spillage;Weak lingual manipulation;Delayed oral transit Oral - Thin Cup Delayed oral transit;Decreased bolus cohesion;Premature spillage;Weak lingual manipulation;Lingual pumping Oral - Thin Straw -- Oral - Puree Premature spillage;Decreased bolus cohesion;Weak lingual manipulation Oral - Mech Soft Impaired mastication;Weak lingual manipulation;Reduced posterior propulsion;Lingual pumping;Delayed oral transit;Premature spillage;Decreased bolus cohesion Oral - Regular -- Oral - Multi-Consistency -- Oral - Pill -- Oral Phase - Comment pt appeared to benefit from SLP verbalizing "1,2,3" to aid in oral transiting timing coordination (although inconsistent); initial delay with transiting was 40 seconds with nectar tsp, 13 seconds with puree , six seconds with thin, impaired oral control allows premature spillage of barium into pharynx and into larynx with thin, lingual pumping and delay noted across all consistencies which will increase his malnutrition and aspiration risk  CHL IP PHARYNGEAL PHASE 05/27/2020 Pharyngeal Phase Impaired Pharyngeal- Pudding Teaspoon -- Pharyngeal -- Pharyngeal- Pudding Cup -- Pharyngeal -- Pharyngeal- Honey Teaspoon -- Pharyngeal -- Pharyngeal- Honey Cup -- Pharyngeal -- Pharyngeal- Nectar Teaspoon Delayed swallow initiation-vallecula;Pharyngeal residue - valleculae Pharyngeal Material does not enter airway Pharyngeal- Nectar Cup Pharyngeal residue - valleculae Pharyngeal -- Pharyngeal- Nectar Straw -- Pharyngeal Material does not enter airway Pharyngeal- Thin Teaspoon Penetration/Aspiration during swallow;Delayed swallow initiation-vallecula;Reduced laryngeal elevation;Reduced airway/laryngeal closure Pharyngeal Material enters airway, remains ABOVE vocal cords and not ejected out Pharyngeal- Thin Cup Penetration/Aspiration during swallow;Reduced airway/laryngeal closure;Reduced laryngeal  elevation;Reduced tongue base retraction;Trace aspiration Pharyngeal Material enters airway, passes BELOW cords  and not ejected out despite cough attempt by patient Pharyngeal- Thin Straw Penetration/Aspiration before swallow;Penetration/Aspiration during swallow;Trace aspiration Pharyngeal Material enters airway, CONTACTS cords and then ejected out;Material enters airway, passes BELOW cords and not ejected out despite cough attempt by patient Pharyngeal- Puree Delayed swallow initiation-vallecula Pharyngeal -- Pharyngeal- Mechanical Soft Delayed swallow initiation-vallecula;Pharyngeal residue - valleculae Pharyngeal Material does not enter airway Pharyngeal- Regular -- Pharyngeal -- Pharyngeal- Multi-consistency -- Pharyngeal -- Pharyngeal- Pill -- Pharyngeal -- Pharyngeal Comment Reflexive cough response x2 during MBS with mild aspiration; aspirates were not cleared with reflexive nor cued cough; Pt did not conduct dry swallow on cue and attempts to "hock" - forcibly expectectorate did not clear mild vallecular retention; SLP advised pt to continue to work on strenghtening "hock" to clear pharynx  CHL IP CERVICAL ESOPHAGEAL PHASE 05/27/2020 Cervical Esophageal Phase [facility]  Upon esophageal sweep, pt appeared clear! Radiologist not present to confirm.  Pudding Teaspoon -- Pudding Cup -- Honey Teaspoon -- Honey Cup -- Nectar Teaspoon -- Nectar Cup -- Nectar Straw -- Thin Teaspoon -- Thin Cup -- Thin Straw -- Puree -- Mechanical Soft -- Regular -- Multi-consistency -- Pill -- Cervical Esophageal Comment -- [name], MS [facility] SLP Acute Rehab Services Office [phone] Pager [phone] [name] 05/27/2020, 3:44 PM                   Scheduled Meds: . arformoterol  15 mcg Nebulization BID  . aspirin  81 mg Per Tube Daily  . budesonide (PULMICORT) nebulizer solution  0.5 mg Nebulization BID  . chlorhexidine  15 mL Mouth Rinse BID  . Chlorhexidine Gluconate Cloth  6 each Topical Daily  . clopidogrel  75  mg Per Tube Daily  . docusate  100 mg Per Tube BID  . heparin  5,000 Units Subcutaneous Q8H  . mouth rinse  15 mL Mouth Rinse q12n4p  . sodium chloride flush  10-40 mL Intracatheter Q12H   Continuous Infusions: . sodium chloride 100 mL/hr at 05/29/20 0426  . dextrose 40 mL/hr at 05/29/20 1050     LOS: 6 days     Alwyn Ren, MD  05/29/2020, 10:58 AM

## 2020-05-30 ENCOUNTER — Inpatient Hospital Stay (HOSPITAL_COMMUNITY): Payer: Medicare Other

## 2020-05-30 DIAGNOSIS — J449 Chronic obstructive pulmonary disease, unspecified: Secondary | ICD-10-CM

## 2020-05-30 DIAGNOSIS — L0390 Cellulitis, unspecified: Secondary | ICD-10-CM | POA: Diagnosis not present

## 2020-05-30 DIAGNOSIS — I70245 Atherosclerosis of native arteries of left leg with ulceration of other part of foot: Secondary | ICD-10-CM

## 2020-05-30 DIAGNOSIS — I70235 Atherosclerosis of native arteries of right leg with ulceration of other part of foot: Secondary | ICD-10-CM

## 2020-05-30 LAB — CBC
HCT: 28.7 % — ABNORMAL LOW (ref 39.0–52.0)
Hemoglobin: 9.1 g/dL — ABNORMAL LOW (ref 13.0–17.0)
MCH: 27.8 pg (ref 26.0–34.0)
MCHC: 31.7 g/dL (ref 30.0–36.0)
MCV: 87.8 fL (ref 80.0–100.0)
Platelets: 147 10*3/uL — ABNORMAL LOW (ref 150–400)
RBC: 3.27 MIL/uL — ABNORMAL LOW (ref 4.22–5.81)
RDW: 17.7 % — ABNORMAL HIGH (ref 11.5–15.5)
WBC: 10 10*3/uL (ref 4.0–10.5)
nRBC: 0.4 % — ABNORMAL HIGH (ref 0.0–0.2)

## 2020-05-30 LAB — BASIC METABOLIC PANEL
Anion gap: 11 (ref 5–15)
Anion gap: 6 (ref 5–15)
BUN: 23 mg/dL (ref 8–23)
BUN: 22 mg/dL (ref 8–23)
CO2: 20 mmol/L — ABNORMAL LOW (ref 22–32)
CO2: 24 mmol/L (ref 22–32)
Calcium: 7.1 mg/dL — ABNORMAL LOW (ref 8.9–10.3)
Calcium: 7.3 mg/dL — ABNORMAL LOW (ref 8.9–10.3)
Chloride: 115 mmol/L — ABNORMAL HIGH (ref 98–111)
Chloride: 115 mmol/L — ABNORMAL HIGH (ref 98–111)
Creatinine, Ser: 1.76 mg/dL — ABNORMAL HIGH (ref 0.61–1.24)
Creatinine, Ser: 1.72 mg/dL — ABNORMAL HIGH (ref 0.61–1.24)
GFR, Estimated: 41 mL/min — ABNORMAL LOW (ref >60)
GFR, Estimated: 42 mL/min — ABNORMAL LOW (ref >60)
Glucose, Bld: 132 mg/dL — ABNORMAL HIGH (ref 70–99)
Glucose, Bld: 118 mg/dL — ABNORMAL HIGH (ref 70–99)
Potassium: 2.8 mmol/L — ABNORMAL LOW (ref 3.5–5.1)
Potassium: 3.6 mmol/L (ref 3.5–5.1)
Sodium: 146 mmol/L — ABNORMAL HIGH (ref 135–145)
Sodium: 145 mmol/L (ref 135–145)

## 2020-05-30 LAB — GLUCOSE, CAPILLARY
Glucose-Capillary: 113 mg/dL — ABNORMAL HIGH (ref 70–99)
Glucose-Capillary: 123 mg/dL — ABNORMAL HIGH (ref 70–99)
Glucose-Capillary: 132 mg/dL — ABNORMAL HIGH (ref 70–99)
Glucose-Capillary: 280 mg/dL — ABNORMAL HIGH (ref 70–99)
Glucose-Capillary: 113 mg/dL — ABNORMAL HIGH (ref 70–99)
Glucose-Capillary: 102 mg/dL — ABNORMAL HIGH (ref 70–99)
Glucose-Capillary: 105 mg/dL — ABNORMAL HIGH (ref 70–99)

## 2020-05-30 MED ORDER — NEPRO/CARBSTEADY PO LIQD
237.0000 mL | Freq: Three times a day (TID) | ORAL | Status: DC
  Filled 2020-05-30 (×7): qty 237

## 2020-05-30 MED ORDER — RESOURCE THICKENUP CLEAR PO POWD
ORAL | Status: DC | PRN
  Filled 2020-05-30 (×2): qty 125

## 2020-05-30 MED ORDER — ADULT MULTIVITAMIN W/MINERALS CH
1.0000 | ORAL_TABLET | Freq: Every day | ORAL | Status: DC
  Filled 2020-05-30: qty 1

## 2020-05-30 MED ORDER — MAGNESIUM SULFATE 2 GM/50ML IV SOLN
2.0000 g | Freq: Once | INTRAVENOUS | Status: AC
  Administered 2020-05-30: 2 g via INTRAVENOUS
  Filled 2020-05-30: qty 50

## 2020-05-30 MED ORDER — POTASSIUM CHLORIDE 10 MEQ/100ML IV SOLN
10.0000 meq | INTRAVENOUS | Status: AC
  Administered 2020-05-30 (×4): 10 meq via INTRAVENOUS
  Filled 2020-05-30 (×3): qty 100

## 2020-05-30 NOTE — Progress Notes (Signed)
Bilateral lower extremity arterial duplex completed. Unable to complete ABI due to wound location, bandaging, and contracture. Refer to "CV Proc" under chart review to view preliminary results.  05/30/2020 4:14 PM Eula Fried., MHA, RVT, RDCS, RDMS

## 2020-05-30 NOTE — Consult Note (Addendum)
Hospital Consult    Reason for Consult:  Bilateral lower extremity ischemic ulceration Referring Physician:  Dr. Jerolyn Center MRN #:  [id number]  History of Present Illness: This is a 69 y.o. male with h/o dementia long history of tobacco use.  He was admitted with acute hypoxic respiratory failure, acute kidney injury and hypernatremia.  He was intubated he is now extubated.  Per his daughter he lives in a nursing home she states that he does walk previously was at normal state of health with mild dementia.  On asa, plavix and statin  Past Medical History:  Diagnosis Date  . Asthma   . COPD (chronic obstructive pulmonary disease) (HCC)   . CVA (cerebral vascular accident) [facility]) 2001   [place]  . Dementia (HCC)   . Tobacco abuse     Past Surgical History:  Procedure Laterality Date  . ESOPHAGEAL DILATION N/A 10/13/2018   Procedure: ESOPHAGEAL DILATION;  Surgeon: West Bali, MD;  Location: [facility];  Service: Endoscopy;  Laterality: N/A;  . ESOPHAGOGASTRODUODENOSCOPY N/A 10/13/2018   Procedure: ESOPHAGOGASTRODUODENOSCOPY (EGD);  Surgeon: West Bali, MD;  Location: [facility];  Service: Endoscopy;  Laterality: N/A;    No Known Allergies  Prior to Admission medications   Medication Sig Start Date End Date Taking? Authorizing Provider  acetaminophen (TYLENOL) 325 MG tablet Take 2 tablets (650 mg total) by mouth every 4 (four) hours as needed for mild pain (or temp > 37.5 C (99.5 F)). 08/27/19  Yes Emokpae, Courage, MD  albuterol (VENTOLIN HFA) 108 (90 Base) MCG/ACT inhaler Inhale 2 puffs into the lungs every 2 (two) hours as needed for wheezing or shortness of breath (cough). 08/27/19  Yes Shon Hale, MD  aspirin 81 MG EC tablet Take 1 tablet (81 mg total) by mouth daily with breakfast. Swallow whole. 08/27/19  Yes Emokpae, Courage, MD  atorvastatin (LIPITOR) 80 MG tablet Take 0.5 tablets (40 mg total) by mouth every evening. Patient taking differently: Take 80 mg by  mouth every evening.  08/27/19  Yes Emokpae, Courage, MD  donepezil (ARICEPT) 10 MG tablet Take 10 mg by mouth at bedtime.   Yes [provider]  ipratropium-albuterol (DUONEB) 0.5-2.5 (3) MG/3ML SOLN Take 3 mLs by nebulization every 6 (six) hours as needed. 08/27/19  Yes Emokpae, Courage, MD  LORazepam (ATIVAN) 1 MG tablet Take 1 mg by mouth daily. Make take additional 1mg  tab prn   Yes [provider]  mirtazapine (REMERON) 15 MG tablet Take 0.5 tablets (7.5 mg total) by mouth at bedtime. 08/27/19  Yes Emokpae, Courage, MD  QUEtiapine (SEROQUEL) 100 MG tablet Take 100 mg by mouth at bedtime.   Yes [provider]  senna-docusate (SENOKOT-S) 8.6-50 MG tablet Take 2 tablets by mouth at bedtime. Patient taking differently: Take 1 tablet by mouth at bedtime.  08/27/19  Yes 10/25/19, MD  traZODone (DESYREL) 50 MG tablet Take 50 mg by mouth at bedtime.   Yes [provider]  budesonide-formoterol (SYMBICORT) 160-4.5 MCG/ACT inhaler Inhale 2 puffs into the lungs 2 (two) times daily. Patient not taking: Reported on 05/24/2020 08/27/19   10/25/19, MD  clopidogrel (PLAVIX) 75 MG tablet Take 1 tablet (75 mg total) by mouth daily. Patient not taking: Reported on 05/24/2020 08/27/19 08/26/20  10/24/20, MD  guaiFENesin (ROBITUSSIN) 100 MG/5ML SOLN Take 5 mLs (100 mg total) by mouth every 4 (four) hours as needed for cough or to loosen phlegm. Patient not taking: Reported on 05/24/2020 08/27/19  Shon Hale, MD  Maltodextrin-Xanthan Gum (RESOURCE THICKENUP CLEAR) POWD Use as directed to thicken liquids to nectar consistency Patient not taking: Reported on 05/24/2020 10/13/18   Cleora Fleet, MD    Social History   Socioeconomic History  . Marital status: Single    Spouse name: Not on file  . Number of children: Not on file  . Years of education: Not on file  . Highest education level: Not on file  Occupational History  . Not on file  Tobacco Use    . Smoking status: Current Every Day Smoker    Packs/day: 0.50    Years: 40.00    Pack years: 20.00    Types: Cigarettes  . Smokeless tobacco: Never Used  Vaping Use  . Vaping Use: Never used  Substance and Sexual Activity  . Alcohol use: Not Currently    Alcohol/week: 14.0 standard drinks    Types: 4 Cans of beer, 10 Shots of liquor per week    Comment: as much as i can get, not current per daughter  . Drug use: Not Currently    Types: Cocaine, Marijuana  . Sexual activity: Yes    Birth control/protection: None  Other Topics Concern  . Not on file  Social History Narrative  . Not on file   Social Determinants of Health   Financial Resource Strain:   . Difficulty of Paying Living Expenses: Not on file  Food Insecurity:   . Worried About Programme researcher, broadcasting/film/video in the Last Year: Not on file  . Ran Out of Food in the Last Year: Not on file  Transportation Needs:   . Lack of Transportation (Medical): Not on file  . Lack of Transportation (Non-Medical): Not on file  Physical Activity:   . Days of Exercise per Week: Not on file  . Minutes of Exercise per Session: Not on file  Stress:   . Feeling of Stress : Not on file  Social Connections:   . Frequency of Communication with Friends and Family: Not on file  . Frequency of Social Gatherings with Friends and Family: Not on file  . Attends Religious Services: Not on file  . Active Member of Clubs or Organizations: Not on file  . Attends Banker Meetings: Not on file  . Marital Status: Not on file  Intimate Partner Violence:   . Fear of Current or Ex-Partner: Not on file  . Emotionally Abused: Not on file  . Physically Abused: Not on file  . Sexually Abused: Not on file    No family history on file.  ROS: Cardiovascular: []  chest pain/pressure []  palpitations []  SOB lying flat []  DOE []  pain in legs while walking [x]  pain in legs at rest []  pain in legs at night []  non-healing ulcers []  hx of DVT []   swelling in legs  Pulmonary: []  productive cough []  asthma/wheezing []  home O2  Neurologic: []  weakness in []  arms []  legs []  numbness in []  arms []  legs []  hx of CVA []  mini stroke [] difficulty speaking or slurred speech []  temporary loss of vision in one eye []  dizziness  Hematologic: []  hx of cancer []  bleeding problems []  problems with blood clotting easily  Endocrine:   []  diabetes []  thyroid disease  GI []  vomiting blood []  blood in stool  GU: []  CKD/renal failure []  HD--[]  M/W/F or []  T/T/S []  burning with urination []  blood in urine  Psychiatric: []  anxiety []  depression  Musculoskeletal: []  arthritis []  joint pain  Integumentary: []  rashes []  ulcers  Constitutional: []  fever []  chills   Physical Examination  Vitals:   05/30/20 0728 05/30/20 0800  BP:    Pulse:    Resp:    Temp:  (!) 97 F (36.1 C)  SpO2: 95%    Body mass index is 16.39 kg/m.  General:  nad HENT: temporal wasting Pulmonary: normal non-labored breathing Cardiac: bilateral popliteal artery pulses are palpable, no pedal pulses Abdomen: soft, NT/ND, no masses Extremities: bilateral feet have dusky dark discolored toes to the level of the midfoot and are very cool to the touch with blistering on both feet Neurologic: He follows simple commands with his upper extremities, he does not move either of his legs though he cannot lift either leg off the bed, he is not moving either foot to command  CBC    Component Value Date/Time   WBC 10.0 05/30/2020 0328   RBC 3.27 (L) 05/30/2020 0328   HGB 9.1 (L) 05/30/2020 0328   HCT 28.7 (L) 05/30/2020 0328   PLT 147 (L) 05/30/2020 0328   MCV 87.8 05/30/2020 0328   MCH 27.8 05/30/2020 0328   MCHC 31.7 05/30/2020 0328   RDW 17.7 (H) 05/30/2020 0328   LYMPHSABS 0.6 (L) 05/27/2020 0305   MONOABS 0.5 05/27/2020 0305   EOSABS 0.2 05/27/2020 0305   BASOSABS 0.0 05/27/2020 0305    BMET    Component Value Date/Time   NA 146 (H)  05/30/2020 0854   K 2.8 (L) 05/30/2020 0854   CL 115 (H) 05/30/2020 0854   CO2 20 (L) 05/30/2020 0854   GLUCOSE 132 (H) 05/30/2020 0854   BUN 23 05/30/2020 0854   CREATININE 1.76 (H) 05/30/2020 0854   CALCIUM 7.1 (L) 05/30/2020 0854   GFRNONAA 41 (L) 05/30/2020 0854   GFRAA >60 11/21/2019 2133    COAGS: Lab Results  Component Value Date   INR 1.5 (H) 05/23/2020   INR 1.10 08/11/2015     Non-Invasive Vascular Imaging:   No studies   ASSESSMENT/PLAN: This is a 69 y.o. male with bilateral lower extremity ischemic ulceration appears to have nonsalvageable feet.  I discussed with the daughter the options being palliative care versus palliative care plus or minus of bilateral above-knee amputations as I do not think revascularization is suitable for this patient.  She demonstrates good understanding she would not want him to have above-knee amputations.  She is going to talk with her aunt as well to have another family member involved.  Vascular surgery can be available as needed.  Taimi Towe C. [id number], MD [facility] Office: [phone] Pager: [phone]

## 2020-05-30 NOTE — Progress Notes (Signed)
Nutrition Follow-up  DOCUMENTATION CODES:   Underweight, Severe malnutrition in context of chronic illness  INTERVENTION:   Nepro Shake po TID, each supplement provides 425 kcal and 19 grams protein  Magic cup TID with meals, each supplement provides 290 kcal and 9 grams of protein  MVI daily   Pt at moderate refeed risk; recommend monitor potassium, magnesium and phosphorus labs daily until stable  48 hr Calorie Count  NUTRITION DIAGNOSIS:   Severe Malnutrition related to chronic illness (CVA and dementia) as evidenced by moderate fat depletion, moderate muscle depletion, severe fat depletion, severe muscle depletion.  GOAL:   Patient will meet greater than or equal to 90% of their needs -not met   MONITOR:   PO intake, Supplement acceptance, Labs, Weight trends, Skin, I & O's  REASON FOR ASSESSMENT:   Consult Calorie Count  ASSESSMENT:   69 y/o male with h/o dementia, substance abuse, CVA and COPD who was admitted 10/30 with AMS in setting of acute hypoxemic respiratory failure with concern for L PNA, AKI  and profound hypernatremia  RD working remotely.  RD received consult for 48hr calorie count. Spoke with RN, NGT removed by pt on 11/1 and was unable to be replaced. Pt seen by SLP and underwent MBSS on 11/3 and was approved for nectar thick liquids; pt currently ordered for thin liquids. Per RN, pt has been eating a few bites of ice cream. RD requested for RN to place calorie count envelope on pt's door and to please keep meal receipts with documented intakes. Results from calorie count will not be available until 11/8. RD will add supplements and MVI to help pt meet his estimated needs. RD will also change pt over to nectar thick liquids in his diet order. Pt is at high refeed risk; recommend monitor electrolytes daily.   Per chart, pt appears fairly weight stable over the past week.   Medications reviewed and include: aspirin, plavix, colace, heparin, 5% dextrose  $RemoveB'@75ml'VuwwkjHi$ /hr, KCl  Labs reviewed: Na 146(H), K 2.8(L), creat 1.76(H) Hgb 9.1(L), Hct 28.7(L) cbgs- 113, 123, 132 x 24 hrs  Diet Order:   Diet Order            Diet full liquid Room service appropriate? No; Fluid consistency: Nectar Thick  Diet effective now                EDUCATION NEEDS:   No education needs have been identified at this time  Skin:  Skin Assessment: Reviewed RN Assessment (ischemic wounds to bilateral lower extremity with skin breakdown)  Last BM:  11/4  Height:   Ht Readings from Last 1 Encounters:  05/23/20 $RemoveB'5\' 8"'oziUCdWf$  (1.727 m)    Weight:   Wt Readings from Last 1 Encounters:  05/30/20 48.9 kg   BMI:  Body mass index is 16.39 kg/m.  Estimated Nutritional Needs:   Kcal:  1610-1760 kcal  Protein:  75-85 grams  Fluid:  >/= 1.7 L/day  Koleen Distance MS, RD, LDN Please refer to [facility] for RD and/or RD on-call/weekend/after hours pager

## 2020-05-30 NOTE — Progress Notes (Signed)
PROGRESS NOTE    Jose Schaefer  IRJ:[id number] DOB: Nov 27, 1950 DOA: 05/23/2020 PCP: [facility]   Brief Narrative: 69 year old male admitted with acute change in mental status likely secondary to acute hypoxic respiratory failure, AKI and profound hypernatremia.  He was found to have a sodium of 180 on admission with a creatinine of 4.27 and pneumonia.  He was admitted on 05/23/2020.  Patient lives at [facility] nursing home.  Patient was intubated in the ER and extubated 05/25/2020.  He has history of dementia, stroke, COPD. 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  He was Covid negative, influenza AMB negative.  He was treated with Rocephin and azithromycin. Seen by speech therapy patient is moderate aspiration risk And risk for inadequate nutrition and hydration.  Assessment & Plan:   Active Problems:   Protein-calorie malnutrition, severe (HCC)   Sepsis (HCC)   CAP (community acquired pneumonia)   #1 severe hypernatremia due to decreased p.o. intake at the nursing home.  Patient also at high risk for aspiration on thickened water which he refuses to drink.  However his legal guardian's daughter wants [place] to keep pushing him to drink thickened water.  Discussed with her in detail.  Continue IV fluids while in hospital.  He is at high risk of getting recurrent hospital admissions. Sodium improved 148.  He has been on IV fluids since admission.   #2  Status post acute hypoxic respiratory failure due to pneumonia-patient received Rocephin azithromycin for 5 days.  Continue Brovana and Pulmicort and aspiration precautions.   #3 AKI due to dehydration renal functions improving continue IV fluids.  Creatinine is 1.76 from 4.36 on admission.  #4 status post rhabdomyolysis continue IV fluids follow-up labs daily.  #5 acute metabolic encephalopathy in the setting of dementia and stroke, secondary to severe dehydration and  hypernatremia. Patient's daughter reports he is normally awake alert walking talking . He is awake today but he tells me he does not want to drink or eat.  Family to come in and feed him and see him today.  #6 thrombocytopenia check CBC in a.m. on heparin platelet count 147.  Monitor.  #7 hypokalemia potassium 2.8.  Replete aggressively.  Replete check magnesium.  #8 possible ischemic wounds to bilateral lower extremity with skin breakdown with diminished pulses and cold to touch.  Consulted vascular surgery. Ordered arterial Doppler and echo  #9 goals of care discussed in detail with patient's healthcare power of attorney his daughter.  He or she would not want him to be on life support or go through CPR.  Consulted palliative care.  Nutrition Problem: Severe Malnutrition Etiology: chronic illness (CVA and dementia)     Signs/Symptoms: moderate fat depletion, moderate muscle depletion, severe fat depletion, severe muscle depletion    Interventions: Tube feeding  Estimated body mass index is 16.39 kg/m as calculated from the following:   Height as of this encounter: 5\' 8"  (1.727 m).   Weight as of this encounter: 48.9 kg.  DVT prophylaxis: Subcu heparin  code Status: Full code Family Communication: Discussed with daughter Disposition Plan:  Status is: Inpatient  Dispo: The patient is from: SNF              Anticipated d/c is to: SNF              Anticipated d/c date is: > 3 days              Patient currently is  not medically stable to d/c.    Consultants:   PCCM  Procedures: Intubation and left femoral line Antimicrobials: Rocephin azithromycin finished a course of 5 days  Subjective: Patient resting in bed he is awake and he is appropriately answering my questions with the head noted up or down yes or no.  Trying to communicate by speaking but very weak.  Objective: Vitals:   05/30/20 0500 05/30/20 0600 05/30/20 0728 05/30/20 0800  BP: 115/65 108/70    Pulse:   (!) 57    Resp: (!) 21 17    Temp:    (!) 97 F (36.1 C)  TempSrc:    Axillary  SpO2:  95% 95%   Weight: 48.9 kg     Height:        Intake/Output Summary (Last 24 hours) at 05/30/2020 1042 Last data filed at 05/30/2020 0645 Gross per 24 hour  Intake 3349.14 ml  Output 3025 ml  Net 324.14 ml   Filed Weights   05/28/20 0500 05/29/20 0500 05/30/20 0500  Weight: 48.6 kg 49 kg 48.9 kg    Examination:  General exam: Appears chronically ill looking Respiratory system: Clear to auscultation. Respiratory effort normal. Cardiovascular system: S1 & S2 heard, RRR. No JVD, murmurs, rubs, gallops or clicks. No pedal edema. Gastrointestinal system: Abdomen is nondistended, soft and nontender. No organomegaly or masses felt. Normal bowel sounds heard. Central nervous system: Awake  extremities: 1+ edema, see pictures from today in the media section with bilateral bluish husky toes with superficial skin involvement. Skin: No rashes, lesions or ulcers Psychiatry: Unable to assess.     Data Reviewed: I have personally reviewed following labs and imaging studies  CBC: Recent Labs  Lab 05/23/20 1530 05/23/20 1530 05/23/20 2245 05/24/20 0410 05/25/20 0345 05/27/20 0305 05/30/20 0328  WBC 17.8*   < > 21.1* 16.1* 15.2* 10.4 10.0  NEUTROABS 16.6*  --  19.1*  --   --  9.1*  --   HGB 12.6*   < > 12.5* 9.8* 10.8* 10.4* 9.1*  HCT 42.6   < > 41.5 32.6* 35.6* 32.2* 28.7*  MCV 95.1   < > 93.5 93.9 92.0 88.0 87.8  PLT 177   < > 176 145* 142* PLATELET CLUMPS NOTED ON SMEAR, UNABLE TO ESTIMATE 147*   < > = values in this interval not displayed.   Basic Metabolic Panel: Recent Labs  Lab 05/23/20 2245 05/23/20 2245 05/24/20 0410 05/24/20 0410 05/24/20 1700 05/25/20 0345 05/26/20 0342 05/27/20 1621 05/28/20 0255 05/28/20 1730 05/29/20 0303 05/30/20 0854  NA >180*   < > 172*   < >  --  168*   < > 152* 160* 156* 155* 146*  K 4.6   < > 4.3   < >  --  4.2   < > 3.0* 3.2* 3.6 3.2* 2.8*  CL  >130*   < > >130*   < >  --  >130*   < > 119* 126* 124* 118* 115*  CO2 24   < > 24   < >  --  21*   < > 23 24 21* 22 20*  GLUCOSE 171*   < > 234*   < >  --  267*   < > 385* 132* 133* 121* 132*  BUN 110*   < > 98*   < >  --  91*   < > 44* 43* 38* 31* 23  CREATININE 5.02*   < > 4.51*   < >  --  4.36*   < > 2.40* 2.56* 2.25* 2.02* 1.76*  CALCIUM 9.3   < > 8.4*   < >  --  7.8*   < > 7.2* 7.6* 7.4* 7.3* 7.1*  MG 3.4*  --  2.8*  --  2.6* 2.6*  --   --   --   --  1.8  --   PHOS 7.7*  --  6.2*  --  5.5* 6.0*  --   --   --   --   --   --    < > = values in this interval not displayed.   GFR: Estimated Creatinine Clearance: 27 mL/min (A) (by C-G formula based on SCr of 1.76 mg/dL (H)). Liver Function Tests: Recent Labs  Lab 05/23/20 1530 05/23/20 2245 05/27/20 0305 05/29/20 0303  AST 42* 47* 112* 83*  ALT 21 24 69* 61*  ALKPHOS 62 64 63 65  BILITOT 1.2 0.4 0.7 0.8  PROT 8.1 8.2* 6.1* 6.0*  ALBUMIN 3.0* 3.0* 2.0* 2.0*   No results for input(s): LIPASE, AMYLASE in the last 168 hours. No results for input(s): AMMONIA in the last 168 hours. Coagulation Profile: Recent Labs  Lab 05/23/20 1530  INR 1.5*   Cardiac Enzymes: Recent Labs  Lab 05/23/20 2245  CKTOTAL 2,469*   BNP (last 3 results) No results for input(s): PROBNP in the last 8760 hours. HbA1C: No results for input(s): HGBA1C in the last 72 hours. CBG: Recent Labs  Lab 05/29/20 1556 05/29/20 1944 05/29/20 2343 05/30/20 0326 05/30/20 0755  GLUCAP 126* 152* 115* 113* 123*   Lipid Profile: No results for input(s): CHOL, HDL, LDLCALC, TRIG, CHOLHDL, LDLDIRECT in the last 72 hours. Thyroid Function Tests: No results for input(s): TSH, T4TOTAL, FREET4, T3FREE, THYROIDAB in the last 72 hours. Anemia Panel: No results for input(s): VITAMINB12, FOLATE, FERRITIN, TIBC, IRON, RETICCTPCT in the last 72 hours. Sepsis Labs: Recent Labs  Lab 05/23/20 1530 05/23/20 1811 05/23/20 2245 05/24/20 0057  LATICACIDVEN 3.8* 2.3*  2.4* 1.9    Recent Results (from the past 240 hour(s))  Respiratory Panel by RT PCR (Flu A&B, Covid) - Nasopharyngeal Swab     Status: None   Collection Time: 05/23/20  1:46 PM   Specimen: Nasopharyngeal Swab  Result Value Ref Range Status   SARS Coronavirus 2 by RT PCR NEGATIVE NEGATIVE Final    Comment: (NOTE) SARS-CoV-2 target nucleic acids are NOT DETECTED.  The SARS-CoV-2 RNA is generally detectable in upper respiratoy specimens during the acute phase of infection. The lowest concentration of SARS-CoV-2 viral copies this assay can detect is 131 copies/mL. A negative result does not preclude SARS-Cov-2 infection and should not be used as the sole basis for treatment or other patient management decisions. A negative result may occur with  improper specimen collection/handling, submission of specimen other than nasopharyngeal swab, presence of viral mutation(s) within the areas targeted by this assay, and inadequate number of viral copies (<131 copies/mL). A negative result must be combined with clinical observations, patient history, and epidemiological information. The expected result is Negative.  Fact Sheet for Patients:  https://www.moore.com/  Fact Sheet for Healthcare Providers:  https://www.young.biz/  This test is no t yet approved or cleared by the [place] FDA and  has been authorized for detection and/or diagnosis of SARS-CoV-2 by FDA under an Emergency Use Authorization (EUA). This EUA will remain  in effect (meaning this test can be used) for the duration of the COVID-19 declaration under Section 564(b)(1) of the Act, 21  U.S.C. section 360bbb-3(b)(1), unless the authorization is terminated or revoked sooner.     Influenza A by PCR NEGATIVE NEGATIVE Final   Influenza B by PCR NEGATIVE NEGATIVE Final    Comment: (NOTE) The Xpert Xpress SARS-CoV-2/FLU/RSV assay is intended as an aid in  the diagnosis of influenza from  Nasopharyngeal swab specimens and  should not be used as a sole basis for treatment. Nasal washings and  aspirates are unacceptable for Xpert Xpress SARS-CoV-2/FLU/RSV  testing.  Fact Sheet for Patients: https://www.moore.com/  Fact Sheet for Healthcare Providers: https://www.young.biz/  This test is not yet approved or cleared by the [place] FDA and  has been authorized for detection and/or diagnosis of SARS-CoV-2 by  FDA under an Emergency Use Authorization (EUA). This EUA will remain  in effect (meaning this test can be used) for the duration of the  Covid-19 declaration under Section 564(b)(1) of the Act, 21  U.S.C. section 360bbb-3(b)(1), unless the authorization is  terminated or revoked. Performed at [facility], [address] [place], [place] [address]   Culture, blood (single)     Status: None   Collection Time: 05/23/20  3:30 PM   Specimen: BLOOD  Result Value Ref Range Status   Specimen Description BLOOD  Final   Special Requests NONE  Final   Culture   Final    NO GROWTH 5 DAYS Performed at [facility], [address], [place], [place] [address]    Report Status 05/28/2020 FINAL  Final  MRSA PCR Screening     Status: None   Collection Time: 05/23/20 11:27 PM   Specimen: Nasopharyngeal  Result Value Ref Range Status   MRSA by PCR NEGATIVE NEGATIVE Final    Comment:        The GeneXpert MRSA Assay (FDA approved for NASAL specimens only), is one component of a comprehensive MRSA colonization surveillance program. It is not intended to diagnose MRSA infection nor to guide or monitor treatment for MRSA infections. Performed at [facility], [address], [place], [place] [address]          Radiology Studies: No results found.      Scheduled Meds: . arformoterol  15 mcg Nebulization BID  . aspirin  81 mg Per Tube Daily  . budesonide (PULMICORT) nebulizer solution  0.5 mg  Nebulization BID  . chlorhexidine  15 mL Mouth Rinse BID  . Chlorhexidine Gluconate Cloth  6 each Topical Daily  . clopidogrel  75 mg Per Tube Daily  . docusate  100 mg Per Tube BID  . heparin  5,000 Units Subcutaneous Q8H  . mouth rinse  15 mL Mouth Rinse q12n4p  . sodium chloride flush  10-40 mL Intracatheter Q12H   Continuous Infusions: . dextrose 75 mL/hr at 05/30/20 0155  . magnesium sulfate bolus IVPB       LOS: 7 days     Alwyn Ren, MD  05/30/2020, 10:42 AM

## 2020-05-30 NOTE — Consult Note (Signed)
WOC Nurse Consult Note: Reason for Consult: Consulted by Dr Jerolyn Center to provide guidance for topical care. Recommended simultaneous consultation with vascular surgery as lesions are thought to have a vasculitic or ischemic presentation. Wound type: partial thickness, ischemic Pressure Injury POA: NA Measurement:Per Nursing Flow Sheet Wound bed:Red, moist with sloughing and splitting epidermis Drainage (amount, consistency, odor) serous Periwound:macerated, discolored purple/blue.  Dressing procedure/placement/frequency:I have provided Nursing with guidance for topical care using a single layer of xeroform gauze (antimicrobial nonadherent). This will be topped with an ABD pad and secured with Kerlix roll gauze/paper tape.  Feet are to be placed into pressure redistribution heel boots.  WOC nursing team will not follow, but will remain available to this patient, the nursing and medical teams.  Please re-consult if needed. Thanks, Ladona Mow, MSN, RN, GNP, [facility]  Pager# [phone]

## 2020-05-31 ENCOUNTER — Inpatient Hospital Stay (HOSPITAL_COMMUNITY): Payer: Medicare Other

## 2020-05-31 DIAGNOSIS — Z515 Encounter for palliative care: Secondary | ICD-10-CM

## 2020-05-31 DIAGNOSIS — Z7189 Other specified counseling: Secondary | ICD-10-CM

## 2020-05-31 DIAGNOSIS — I351 Nonrheumatic aortic (valve) insufficiency: Secondary | ICD-10-CM | POA: Diagnosis not present

## 2020-05-31 DIAGNOSIS — R9431 Abnormal electrocardiogram [ECG] [EKG]: Secondary | ICD-10-CM | POA: Diagnosis not present

## 2020-05-31 LAB — GLUCOSE, CAPILLARY
Glucose-Capillary: 101 mg/dL — ABNORMAL HIGH (ref 70–99)
Glucose-Capillary: 107 mg/dL — ABNORMAL HIGH (ref 70–99)
Glucose-Capillary: 109 mg/dL — ABNORMAL HIGH (ref 70–99)
Glucose-Capillary: 72 mg/dL (ref 70–99)
Glucose-Capillary: 88 mg/dL (ref 70–99)
Glucose-Capillary: 90 mg/dL (ref 70–99)

## 2020-05-31 LAB — BASIC METABOLIC PANEL
Anion gap: 5 (ref 5–15)
BUN: 20 mg/dL (ref 8–23)
CO2: 23 mmol/L (ref 22–32)
Calcium: 7 mg/dL — ABNORMAL LOW (ref 8.9–10.3)
Chloride: 109 mmol/L (ref 98–111)
Creatinine, Ser: 1.59 mg/dL — ABNORMAL HIGH (ref 0.61–1.24)
GFR, Estimated: 46 mL/min — ABNORMAL LOW (ref >60)
Glucose, Bld: 340 mg/dL — ABNORMAL HIGH (ref 70–99)
Potassium: 3 mmol/L — ABNORMAL LOW (ref 3.5–5.1)
Sodium: 137 mmol/L (ref 135–145)

## 2020-05-31 LAB — ECHOCARDIOGRAM COMPLETE
AR max vel: 2.81 cm2
AV Area VTI: 2.92 cm2
AV Area mean vel: 2.48 cm2
AV Mean grad: 2.3 mmHg
AV Peak grad: 5.2 mmHg
Ao pk vel: 1.14 m/s
Area-P 1/2: 2.39 cm2
Height: 68 in
P 1/2 time: 393 msec
S' Lateral: 1.94 cm
Weight: 1774.26 oz

## 2020-05-31 LAB — MAGNESIUM: Magnesium: 1.9 mg/dL (ref 1.7–2.4)

## 2020-05-31 LAB — PHOSPHORUS: Phosphorus: 1.9 mg/dL — ABNORMAL LOW (ref 2.5–4.6)

## 2020-05-31 MED ORDER — POTASSIUM CHLORIDE 10 MEQ/100ML IV SOLN
10.0000 meq | INTRAVENOUS | Status: AC
  Administered 2020-05-31 (×4): 10 meq via INTRAVENOUS
  Filled 2020-05-31 (×4): qty 100

## 2020-05-31 MED ORDER — VANCOMYCIN HCL 1250 MG/250ML IV SOLN
1250.0000 mg | Freq: Once | INTRAVENOUS | Status: AC
  Administered 2020-05-31: 1250 mg via INTRAVENOUS
  Filled 2020-05-31: qty 250

## 2020-05-31 MED ORDER — SODIUM CHLORIDE 0.9 % IV SOLN
2.0000 g | Freq: Two times a day (BID) | INTRAVENOUS | Status: DC
  Administered 2020-05-31 – 2020-06-03 (×6): 2 g via INTRAVENOUS
  Filled 2020-05-31 (×6): qty 2

## 2020-05-31 MED ORDER — IPRATROPIUM-ALBUTEROL 0.5-2.5 (3) MG/3ML IN SOLN
3.0000 mL | Freq: Two times a day (BID) | RESPIRATORY_TRACT | Status: DC
  Administered 2020-06-01 – 2020-06-02 (×4): 3 mL via RESPIRATORY_TRACT
  Filled 2020-05-31 (×4): qty 3

## 2020-05-31 MED ORDER — VANCOMYCIN HCL 750 MG/150ML IV SOLN
750.0000 mg | INTRAVENOUS | Status: DC
  Administered 2020-06-01 – 2020-06-02 (×2): 750 mg via INTRAVENOUS
  Filled 2020-05-31 (×2): qty 150

## 2020-05-31 MED ORDER — POTASSIUM CHLORIDE 10 MEQ/100ML IV SOLN: 10.0000 meq | INTRAVENOUS | Status: DC

## 2020-05-31 NOTE — Progress Notes (Signed)
  Echocardiogram 2D Echocardiogram has been performed.  Leta Jungling M 05/31/2020, 11:46 AM

## 2020-05-31 NOTE — Progress Notes (Signed)
Pharmacy Antibiotic Note  Jose Schaefer is a 69 y.o. male admitted on 05/23/2020 with respiratory failure. Patient recently completed a course of azithromycin and ceftriaxone. Pharmacy has been consulted for vancomycin and cefepime dosing.  Plan: Cefepime 2 g iv q 12 hours  Vancomycin 1250 mg iv once followed by 750 mg iv q 24 hours  Predicted AUC 529  F/U renal function, culture results, clinical course  Height: 5\' 8"  (172.7 cm) Weight: 50.3 kg (110 lb 14.3 oz) IBW/kg (Calculated) : 68.4  Temp (24hrs), Avg:98 F (36.7 C), Min:97.5 F (36.4 C), Max:98.5 F (36.9 C)  Recent Labs  Lab 05/25/20 0345 05/26/20 0342 05/27/20 0305 05/27/20 1036 05/28/20 1730 05/29/20 0303 05/30/20 0328 05/30/20 0854 05/30/20 1806 05/31/20 0113  WBC 15.2*  --  10.4  --   --   --  10.0  --   --   --   CREATININE 4.36*   < > 2.88*   < > 2.25* 2.02*  --  1.76* 1.72* 1.59*   < > = values in this interval not displayed.    Estimated Creatinine Clearance: 30.8 mL/min (A) (by C-G formula based on SCr of 1.59 mg/dL (H)).    No Known Allergies   Thank you for allowing pharmacy to be a part of this patient's care.  13/07/21 05/31/2020 6:19 PM

## 2020-05-31 NOTE — Progress Notes (Signed)
PROGRESS NOTE    Jose Schaefer  WUJ:[id number] DOB: [birth year] DOA: 05/23/2020 PCP: [facility]   Brief Narrative: 69 year old male admitted with acute change in mental status likely secondary to acute hypoxic respiratory failure, AKI and profound hypernatremia.  He was found to have a sodium of 180 on admission with a creatinine of 4.27 and pneumonia.  He was admitted on 05/23/2020.  Patient lives at [facility] nursing home.  Patient was intubated in the ER and extubated 05/25/2020.  He has history of dementia, stroke, COPD. 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  He was Covid negative, influenza AMB negative.  He was treated with Rocephin and azithromycin. Seen by speech therapy patient is moderate aspiration risk And risk for inadequate nutrition and hydration.  Assessment & Plan:   Active Problems:   Protein-calorie malnutrition, severe (HCC)   Sepsis (HCC)   CAP (community acquired pneumonia)   #1 severe hypernatremia due to decreased p.o. intake at the nursing home.  Patient also at high risk for aspiration on thickened water which he refuses to drink. Sodium improved 137.  He has been on IV fluids since admission.  Will dc D5 today  #2  Status post acute hypoxic respiratory failure due to pneumonia-patient received Rocephin azithromycin for 5 days.  Continue Brovana and Pulmicort and aspiration precautions.   #3 AKI due to dehydration renal functions improving continue IV fluids.  Creatinine is 1.59 from 4.36 on admission.  #4 status post rhabdomyolysis resolved   #5 acute metabolic encephalopathy in the setting of dementia and stroke, secondary to severe dehydration and hypernatremia. Patient's daughter reports he is normally awake alert walking talking . He is awake today but he tells me he does not want to drink or eat.    #6 thrombocytopenia -on heparin subcu monitor.  #7 hypokalemia/hypophosphatemia  -potassium 3.0 phosphorus 1.9.  Replete and recheck.    #8ischemic wounds to bilateral lower extremity with skin breakdown with diminished pulses and cold to touch.  Arterial Doppler 50 to 74% stenosis noted in the superficial femoral artery left lower extremity, monophasic flow in the right lower extremity with possible aortoiliac disease.  Appreciate vascular surgery input.  Family does not want amputation to be done.   #9 goals of care discussed in detail with patient's healthcare power of attorney his daughter.  He or she would not want him to be on life support or go through CPR.  Consulted palliative care.  Nutrition Problem: Severe Malnutrition Etiology: chronic illness (CVA and dementia)     Signs/Symptoms: moderate fat depletion, moderate muscle depletion, severe fat depletion, severe muscle depletion    Interventions: Tube feeding  Estimated body mass index is 16.86 kg/m as calculated from the following:   Height as of this encounter: 5\' 8"  (1.727 m).   Weight as of this encounter: 50.3 kg.  DVT prophylaxis: Subcu heparin  code Status: Full code Family Communication: Attempted to call daughter Alcario Drought today with no response Disposition Plan:  Status is: Inpatient  Dispo: The patient is from: SNF              Anticipated d/c is to: SNF              Anticipated d/c date is: > 3 days              Patient currently is not medically stable to d/c.    Consultants:   PCCM  Procedures: Intubation and left femoral line  Antimicrobials: Rocephin azithromycin finished a course of 5 days  Subjective: Patient appears very weak staff concerned he is aspirating thickened water and food.   Patient has a wet cough this morning still on room air.  Objective: Vitals:   05/31/20 0500 05/31/20 0600 05/31/20 0700 05/31/20 0827  BP: 120/68 (!) 110/57    Pulse: 87 96 81   Resp: (!) 21 17 20    Temp:    98.2 F (36.8 C)  TempSrc:    Axillary  SpO2: 100% 96% 100%   Weight: 50.3  kg     Height:        Intake/Output Summary (Last 24 hours) at 05/31/2020 1030 Last data filed at 05/31/2020 4098 Gross per 24 hour  Intake 2980.66 ml  Output 1525 ml  Net 1455.66 ml   Filed Weights   05/29/20 0500 05/30/20 0500 05/31/20 0500  Weight: 49 kg 48.9 kg 50.3 kg    Examination:  General exam: Appears chronically ill looking Respiratory system: Clear to auscultation. Respiratory effort normal. Cardiovascular system: S1 & S2 heard, RRR. No JVD, murmurs, rubs, gallops or clicks. No pedal edema. Gastrointestinal system: Abdomen is nondistended, soft and nontender. No organomegaly or masses felt. Normal bowel sounds heard. Central nervous system: Awake  extremities: 1+ edema, see pictures from today in the media section with bilateral bluish husky toes with superficial skin involvement. Skin: No rashes, lesions or ulcers Psychiatry: Unable to assess.     Data Reviewed: I have personally reviewed following labs and imaging studies  CBC: Recent Labs  Lab 05/25/20 0345 05/27/20 0305 05/30/20 0328  WBC 15.2* 10.4 10.0  NEUTROABS  --  9.1*  --   HGB 10.8* 10.4* 9.1*  HCT 35.6* 32.2* 28.7*  MCV 92.0 88.0 87.8  PLT 142* PLATELET CLUMPS NOTED ON SMEAR, UNABLE TO ESTIMATE 147*   Basic Metabolic Panel: Recent Labs  Lab 05/24/20 1700 05/25/20 0345 05/26/20 0342 05/28/20 1730 05/29/20 0303 05/30/20 0854 05/30/20 1806 05/31/20 0113  NA  --  168*   < > 156* 155* 146* 145 137  K  --  4.2   < > 3.6 3.2* 2.8* 3.6 3.0*  CL  --  >130*   < > 124* 118* 115* 115* 109  CO2  --  21*   < > 21* 22 20* 24 23  GLUCOSE  --  267*   < > 133* 121* 132* 118* 340*  BUN  --  91*   < > 38* 31* 23 22 20   CREATININE  --  4.36*   < > 2.25* 2.02* 1.76* 1.72* 1.59*  CALCIUM  --  7.8*   < > 7.4* 7.3* 7.1* 7.3* 7.0*  MG 2.6* 2.6*  --   --  1.8  --   --  1.9  PHOS 5.5* 6.0*  --   --   --   --   --  1.9*   < > = values in this interval not displayed.   GFR: Estimated Creatinine Clearance:  30.8 mL/min (A) (by C-G formula based on SCr of 1.59 mg/dL (H)). Liver Function Tests: Recent Labs  Lab 05/27/20 0305 05/29/20 0303  AST 112* 83*  ALT 69* 61*  ALKPHOS 63 65  BILITOT 0.7 0.8  PROT 6.1* 6.0*  ALBUMIN 2.0* 2.0*   No results for input(s): LIPASE, AMYLASE in the last 168 hours. No results for input(s): AMMONIA in the last 168 hours. Coagulation Profile: No results for input(s): INR, PROTIME in the last 168 hours. Cardiac  Enzymes: No results for input(s): CKTOTAL, CKMB, CKMBINDEX, TROPONINI in the last 168 hours. BNP (last 3 results) No results for input(s): PROBNP in the last 8760 hours. HbA1C: No results for input(s): HGBA1C in the last 72 hours. CBG: Recent Labs  Lab 05/30/20 1801 05/30/20 1938 05/30/20 2308 05/31/20 0325 05/31/20 0805  GLUCAP 113* 102* 105* 107* 109*   Lipid Profile: No results for input(s): CHOL, HDL, LDLCALC, TRIG, CHOLHDL, LDLDIRECT in the last 72 hours. Thyroid Function Tests: No results for input(s): TSH, T4TOTAL, FREET4, T3FREE, THYROIDAB in the last 72 hours. Anemia Panel: No results for input(s): VITAMINB12, FOLATE, FERRITIN, TIBC, IRON, RETICCTPCT in the last 72 hours. Sepsis Labs: No results for input(s): PROCALCITON, LATICACIDVEN in the last 168 hours.  Recent Results (from the past 240 hour(s))  Respiratory Panel by RT PCR (Flu A&B, Covid) - Nasopharyngeal Swab     Status: None   Collection Time: 05/23/20  1:46 PM   Specimen: Nasopharyngeal Swab  Result Value Ref Range Status   SARS Coronavirus 2 by RT PCR NEGATIVE NEGATIVE Final    Comment: (NOTE) SARS-CoV-2 target nucleic acids are NOT DETECTED.  The SARS-CoV-2 RNA is generally detectable in upper respiratoy specimens during the acute phase of infection. The lowest concentration of SARS-CoV-2 viral copies this assay can detect is 131 copies/mL. A negative result does not preclude SARS-Cov-2 infection and should not be used as the sole basis for treatment or other  patient management decisions. A negative result may occur with  improper specimen collection/handling, submission of specimen other than nasopharyngeal swab, presence of viral mutation(s) within the areas targeted by this assay, and inadequate number of viral copies (<131 copies/mL). A negative result must be combined with clinical observations, patient history, and epidemiological information. The expected result is Negative.  Fact Sheet for Patients:  https://www.moore.com/  Fact Sheet for Healthcare Providers:  https://www.young.biz/  This test is no t yet approved or cleared by the [place] FDA and  has been authorized for detection and/or diagnosis of SARS-CoV-2 by FDA under an Emergency Use Authorization (EUA). This EUA will remain  in effect (meaning this test can be used) for the duration of the COVID-19 declaration under Section 564(b)(1) of the Act, 21 U.S.C. section 360bbb-3(b)(1), unless the authorization is terminated or revoked sooner.     Influenza A by PCR NEGATIVE NEGATIVE Final   Influenza B by PCR NEGATIVE NEGATIVE Final    Comment: (NOTE) The Xpert Xpress SARS-CoV-2/FLU/RSV assay is intended as an aid in  the diagnosis of influenza from Nasopharyngeal swab specimens and  should not be used as a sole basis for treatment. Nasal washings and  aspirates are unacceptable for Xpert Xpress SARS-CoV-2/FLU/RSV  testing.  Fact Sheet for Patients: https://www.moore.com/  Fact Sheet for Healthcare Providers: https://www.young.biz/  This test is not yet approved or cleared by the [place] FDA and  has been authorized for detection and/or diagnosis of SARS-CoV-2 by  FDA under an Emergency Use Authorization (EUA). This EUA will remain  in effect (meaning this test can be used) for the duration of the  Covid-19 declaration under Section 564(b)(1) of the Act, 21  U.S.C. section  360bbb-3(b)(1), unless the authorization is  terminated or revoked. Performed at [facility], [address], [place], [place] [address]   Culture, blood (single)     Status: None   Collection Time: 05/23/20  3:30 PM   Specimen: BLOOD  Result Value Ref Range Status   Specimen Description BLOOD  Final  Special Requests NONE  Final   Culture   Final    NO GROWTH 5 DAYS Performed at [facility], [address] [place], [place] [address]    Report Status 05/28/2020 FINAL  Final  MRSA PCR Screening     Status: None   Collection Time: 05/23/20 11:27 PM   Specimen: Nasopharyngeal  Result Value Ref Range Status   MRSA by PCR NEGATIVE NEGATIVE Final    Comment:        The GeneXpert MRSA Assay (FDA approved for NASAL specimens only), is one component of a comprehensive MRSA colonization surveillance program. It is not intended to diagnose MRSA infection nor to guide or monitor treatment for MRSA infections. Performed at [facility], [address], [place], [place] [address]          Radiology Studies: VAS [place] LOWER EXTREMITY ARTERIAL DUPLEX  Result Date: 05/30/2020 LOWER EXTREMITY ARTERIAL DUPLEX STUDY Indications: Ulceration. High Risk Factors: Current smoker, prior CVA.  Current ABI: Unable to complete due to wounds, bandaging, and patient in              contracted state Limitations: bandaging, wound location, contracture Comparison Study: No prior study Performing Technologist: Gertie Fey MHA, RDMS, RVT, RDCS  Examination Guidelines: A complete evaluation includes B-mode imaging, spectral Doppler, color Doppler, and power Doppler as needed of all accessible portions of each vessel. Bilateral testing is considered an integral part of a complete examination. Limited examinations for reoccurring indications may be performed as noted.  +-----------+--------+-----+--------+----------+-------------------+ RIGHT      PSV cm/sRatioStenosisWaveform   Comments            +-----------+--------+-----+--------+----------+-------------------+ CFA Distal 72                   monophasic                    +-----------+--------+-----+--------+----------+-------------------+ DFA        124                  triphasic                     +-----------+--------+-----+--------+----------+-------------------+ SFA Prox   99                   monophasic                    +-----------+--------+-----+--------+----------+-------------------+ SFA Mid    96                   monophasic                    +-----------+--------+-----+--------+----------+-------------------+ SFA Distal 167                  monophasic                    +-----------+--------+-----+--------+----------+-------------------+ POP Distal 119                  monophasic                    +-----------+--------+-----+--------+----------+-------------------+ ATA Distal 17                   monophasic                    +-----------+--------+-----+--------+----------+-------------------+ PTA Distal 58  monophasic                    +-----------+--------+-----+--------+----------+-------------------+ PERO Distal                               Unable to visualize +-----------+--------+-----+--------+----------+-------------------+  +-----------+--------+-----+---------------+----------+--------------------+ LEFT       PSV cm/sRatioStenosis       Waveform  Comments             +-----------+--------+-----+---------------+----------+--------------------+ CFA Distal                                       Unable to vissualize +-----------+--------+-----+---------------+----------+--------------------+ DFA                                              Unable to vissualize +-----------+--------+-----+---------------+----------+--------------------+ SFA Prox   118                         monophasic                      +-----------+--------+-----+---------------+----------+--------------------+ SFA Mid    326          50-74% stenosismonophasic                     +-----------+--------+-----+---------------+----------+--------------------+ SFA Distal 120                         monophasic                     +-----------+--------+-----+---------------+----------+--------------------+ POP Distal 69                          monophasic                     +-----------+--------+-----+---------------+----------+--------------------+ ATA Distal 30                          monophasic                     +-----------+--------+-----+---------------+----------+--------------------+ PTA Distal 31                          monophasic                     +-----------+--------+-----+---------------+----------+--------------------+ PERO Distal                                      Unable to vissualize +-----------+--------+-----+---------------+----------+--------------------+  Summary: Right: Monophasic flow at the CFA, suggestive of possible aorto-iliac disease. Monophasic flow seen throughout remainder of right lower extremity. 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  See table(s) above for measurements and observations.    Preliminary         Scheduled Meds: . arformoterol  15 mcg Nebulization BID  . aspirin  81 mg Per Tube Daily  . budesonide (PULMICORT) nebulizer solution  0.5 mg Nebulization BID  . chlorhexidine  15 mL Mouth Rinse BID  . Chlorhexidine Gluconate Cloth  6 each Topical Daily  . clopidogrel  75 mg Per Tube Daily  . docusate  100 mg Per Tube BID  . feeding supplement (NEPRO CARB STEADY)  237 mL Oral TID BM  . heparin  5,000 Units Subcutaneous Q8H  . mouth rinse  15 mL Mouth Rinse q12n4p  . multivitamin with minerals  1 tablet Oral Daily  . sodium chloride flush  10-40 mL Intracatheter Q12H   Continuous Infusions: . dextrose 75 mL/hr at 05/31/20  0600     LOS: 8 days     Alwyn Ren, MD  05/31/2020, 10:30 AM

## 2020-06-01 ENCOUNTER — Inpatient Hospital Stay (HOSPITAL_COMMUNITY): Payer: Medicare Other

## 2020-06-01 DIAGNOSIS — L97909 Non-pressure chronic ulcer of unspecified part of unspecified lower leg with unspecified severity: Secondary | ICD-10-CM

## 2020-06-01 DIAGNOSIS — E43 Unspecified severe protein-calorie malnutrition: Secondary | ICD-10-CM

## 2020-06-01 LAB — BASIC METABOLIC PANEL
Anion gap: 6 (ref 5–15)
BUN: 17 mg/dL (ref 8–23)
CO2: 22 mmol/L (ref 22–32)
Calcium: 7.3 mg/dL — ABNORMAL LOW (ref 8.9–10.3)
Chloride: 116 mmol/L — ABNORMAL HIGH (ref 98–111)
Creatinine, Ser: 1.39 mg/dL — ABNORMAL HIGH (ref 0.61–1.24)
GFR, Estimated: 55 mL/min — ABNORMAL LOW (ref >60)
Glucose, Bld: 100 mg/dL — ABNORMAL HIGH (ref 70–99)
Potassium: 3.1 mmol/L — ABNORMAL LOW (ref 3.5–5.1)
Sodium: 144 mmol/L (ref 135–145)

## 2020-06-01 LAB — GLUCOSE, CAPILLARY
Glucose-Capillary: 105 mg/dL — ABNORMAL HIGH (ref 70–99)
Glucose-Capillary: 87 mg/dL (ref 70–99)
Glucose-Capillary: 111 mg/dL — ABNORMAL HIGH (ref 70–99)
Glucose-Capillary: 156 mg/dL — ABNORMAL HIGH (ref 70–99)

## 2020-06-01 LAB — MAGNESIUM: Magnesium: 1.8 mg/dL (ref 1.7–2.4)

## 2020-06-01 LAB — PHOSPHORUS: Phosphorus: 2.6 mg/dL (ref 2.5–4.6)

## 2020-06-01 MED ORDER — NEPRO/CARBSTEADY PO LIQD
237.0000 mL | ORAL | Status: DC
  Administered 2020-06-03: 237 mL via ORAL
  Filled 2020-06-01 (×2): qty 237

## 2020-06-01 MED ORDER — POTASSIUM CHLORIDE 10 MEQ/100ML IV SOLN
10.0000 meq | INTRAVENOUS | Status: AC
  Administered 2020-06-01 (×4): 10 meq via INTRAVENOUS
  Filled 2020-06-01 (×4): qty 100

## 2020-06-01 NOTE — Progress Notes (Signed)
PROGRESS NOTE    Jose Schaefer  WUJ:[id number] DOB: 1950/11/11 DOA: 05/23/2020 [facility]: [facility]   Brief Narrative: 69 year old male admitted with acute change in mental status likely secondary to acute hypoxic respiratory failure, AKI and profound hypernatremia.  He was found to have a sodium of 180 on admission with a creatinine of 4.27 and pneumonia.  He was admitted on 05/23/2020.  Patient lives at [facility] nursing home.  Patient was intubated in the ER and extubated 05/25/2020.  He has history of dementia, stroke, COPD. 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  He was Covid negative, influenza AMB negative.  He was treated with Rocephin and azithromycin. Seen by speech therapy patient is moderate aspiration risk And risk for inadequate nutrition and hydration.  Assessment & Plan:   Active Problems:   Protein-calorie malnutrition, severe (HCC)   Sepsis (HCC)   CAP (community acquired pneumonia)   #1 severe hypernatremia due to decreased p.o. intake at the nursing home.  Patient also at high risk for aspiration on thickened water, but still continues to aspirate as he has a wet cough and coughs with eating.  He is on D5 at 75 cc an hour. SODIUM 144 FROM 180.  #2  Status post acute hypoxic respiratory failure due to pneumonia-patient received Rocephin azithromycin for 5 days.  Continue Brovana and Pulmicort and aspiration precautions.  Repeat chest x-ray 05/31/2020 with left lower lobe infiltrates consistent with pneumonia started on Vanco and cefepime.  #3 AKI due to dehydration renal functions improving continue IV fluids.  Creatinine is 1.39 from 4.36 on admission.  #4 status post rhabdomyolysis resolved   #5 acute metabolic encephalopathy in the setting of dementia and stroke, secondary to severe dehydration and hypernatremia. Patient's daughter reports he is normally awake alert walking talking . He is awake today  but he tells me he does not want to drink or eat.    #6 thrombocytopenia -on heparin subcu monitor.  #7 hypokalemia/hypophosphatemia -potassium 3.1 phosphorus 1.9.  Replete and recheck.    #8ischemic wounds to bilateral lower extremity with skin breakdown with diminished pulses and cold to touch.  Arterial Doppler 50 to 74% stenosis noted in the superficial femoral artery left lower extremity, monophasic flow in the right lower extremity with possible aortoiliac disease.  Appreciate vascular surgery input.  Family does not want amputation to be done.   #9 goals of care discussed in detail with patient's healthcare power of attorney his daughter.  He or she would not want him to be on life support or go through CPR.  Consulted palliative care.  Appreciate input.  Nutrition Problem: Severe Malnutrition Etiology: chronic illness (CVA and dementia)     Signs/Symptoms: moderate fat depletion, moderate muscle depletion, severe fat depletion, severe muscle depletion    Interventions: Tube feeding  Estimated body mass index is 16.19 kg/m as calculated from the following:   Height as of this encounter: 5\' 8"  (1.727 m).   Weight as of this encounter: 48.3 kg.  DVT prophylaxis: None due to thrombocytopenia and bilateral lower extremity ischemia code Status: Full code Family Communication: Attempted to call daughter Alcario Drought today with no response Disposition Plan:  Status is: Inpatient  Dispo: The patient is from: SNF              Anticipated d/c is to: SNF              Anticipated d/c date is: > 3 days  PROGRESS NOTE    Jose Schaefer  WUJ:[id number] DOB: 1950/11/11 DOA: 05/23/2020 [facility]: [facility]   Brief Narrative: 69 year old male admitted with acute change in mental status likely secondary to acute hypoxic respiratory failure, AKI and profound hypernatremia.  He was found to have a sodium of 180 on admission with a creatinine of 4.27 and pneumonia.  He was admitted on 05/23/2020.  Patient lives at [facility] nursing home.  Patient was intubated in the ER and extubated 05/25/2020.  He has history of dementia, stroke, COPD. 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  He was Covid negative, influenza AMB negative.  He was treated with Rocephin and azithromycin. Seen by speech therapy patient is moderate aspiration risk And risk for inadequate nutrition and hydration.  Assessment & Plan:   Active Problems:   Protein-calorie malnutrition, severe (HCC)   Sepsis (HCC)   CAP (community acquired pneumonia)   #1 severe hypernatremia due to decreased p.o. intake at the nursing home.  Patient also at high risk for aspiration on thickened water, but still continues to aspirate as he has a wet cough and coughs with eating.  He is on D5 at 75 cc an hour. SODIUM 144 FROM 180.  #2  Status post acute hypoxic respiratory failure due to pneumonia-patient received Rocephin azithromycin for 5 days.  Continue Brovana and Pulmicort and aspiration precautions.  Repeat chest x-ray 05/31/2020 with left lower lobe infiltrates consistent with pneumonia started on Vanco and cefepime.  #3 AKI due to dehydration renal functions improving continue IV fluids.  Creatinine is 1.39 from 4.36 on admission.  #4 status post rhabdomyolysis resolved   #5 acute metabolic encephalopathy in the setting of dementia and stroke, secondary to severe dehydration and hypernatremia. Patient's daughter reports he is normally awake alert walking talking . He is awake today  but he tells me he does not want to drink or eat.    #6 thrombocytopenia -on heparin subcu monitor.  #7 hypokalemia/hypophosphatemia -potassium 3.1 phosphorus 1.9.  Replete and recheck.    #8ischemic wounds to bilateral lower extremity with skin breakdown with diminished pulses and cold to touch.  Arterial Doppler 50 to 74% stenosis noted in the superficial femoral artery left lower extremity, monophasic flow in the right lower extremity with possible aortoiliac disease.  Appreciate vascular surgery input.  Family does not want amputation to be done.   #9 goals of care discussed in detail with patient's healthcare power of attorney his daughter.  He or she would not want him to be on life support or go through CPR.  Consulted palliative care.  Appreciate input.  Nutrition Problem: Severe Malnutrition Etiology: chronic illness (CVA and dementia)     Signs/Symptoms: moderate fat depletion, moderate muscle depletion, severe fat depletion, severe muscle depletion    Interventions: Tube feeding  Estimated body mass index is 16.19 kg/m as calculated from the following:   Height as of this encounter: 5\' 8"  (1.727 m).   Weight as of this encounter: 48.3 kg.  DVT prophylaxis: None due to thrombocytopenia and bilateral lower extremity ischemia code Status: Full code Family Communication: Attempted to call daughter Alcario Drought today with no response Disposition Plan:  Status is: Inpatient  Dispo: The patient is from: SNF              Anticipated d/c is to: SNF              Anticipated d/c date is: > 3 days  PROGRESS NOTE    Jose Schaefer  WUJ:[id number] DOB: 1950/11/11 DOA: 05/23/2020 [facility]: [facility]   Brief Narrative: 69 year old male admitted with acute change in mental status likely secondary to acute hypoxic respiratory failure, AKI and profound hypernatremia.  He was found to have a sodium of 180 on admission with a creatinine of 4.27 and pneumonia.  He was admitted on 05/23/2020.  Patient lives at [facility] nursing home.  Patient was intubated in the ER and extubated 05/25/2020.  He has history of dementia, stroke, COPD. 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  He was Covid negative, influenza AMB negative.  He was treated with Rocephin and azithromycin. Seen by speech therapy patient is moderate aspiration risk And risk for inadequate nutrition and hydration.  Assessment & Plan:   Active Problems:   Protein-calorie malnutrition, severe (HCC)   Sepsis (HCC)   CAP (community acquired pneumonia)   #1 severe hypernatremia due to decreased p.o. intake at the nursing home.  Patient also at high risk for aspiration on thickened water, but still continues to aspirate as he has a wet cough and coughs with eating.  He is on D5 at 75 cc an hour. SODIUM 144 FROM 180.  #2  Status post acute hypoxic respiratory failure due to pneumonia-patient received Rocephin azithromycin for 5 days.  Continue Brovana and Pulmicort and aspiration precautions.  Repeat chest x-ray 05/31/2020 with left lower lobe infiltrates consistent with pneumonia started on Vanco and cefepime.  #3 AKI due to dehydration renal functions improving continue IV fluids.  Creatinine is 1.39 from 4.36 on admission.  #4 status post rhabdomyolysis resolved   #5 acute metabolic encephalopathy in the setting of dementia and stroke, secondary to severe dehydration and hypernatremia. Patient's daughter reports he is normally awake alert walking talking . He is awake today  but he tells me he does not want to drink or eat.    #6 thrombocytopenia -on heparin subcu monitor.  #7 hypokalemia/hypophosphatemia -potassium 3.1 phosphorus 1.9.  Replete and recheck.    #8ischemic wounds to bilateral lower extremity with skin breakdown with diminished pulses and cold to touch.  Arterial Doppler 50 to 74% stenosis noted in the superficial femoral artery left lower extremity, monophasic flow in the right lower extremity with possible aortoiliac disease.  Appreciate vascular surgery input.  Family does not want amputation to be done.   #9 goals of care discussed in detail with patient's healthcare power of attorney his daughter.  He or she would not want him to be on life support or go through CPR.  Consulted palliative care.  Appreciate input.  Nutrition Problem: Severe Malnutrition Etiology: chronic illness (CVA and dementia)     Signs/Symptoms: moderate fat depletion, moderate muscle depletion, severe fat depletion, severe muscle depletion    Interventions: Tube feeding  Estimated body mass index is 16.19 kg/m as calculated from the following:   Height as of this encounter: 5\' 8"  (1.727 m).   Weight as of this encounter: 48.3 kg.  DVT prophylaxis: None due to thrombocytopenia and bilateral lower extremity ischemia code Status: Full code Family Communication: Attempted to call daughter Alcario Drought today with no response Disposition Plan:  Status is: Inpatient  Dispo: The patient is from: SNF              Anticipated d/c is to: SNF              Anticipated d/c date is: > 3 days  PROGRESS NOTE    Jose Schaefer  WUJ:[id number] DOB: 1950/11/11 DOA: 05/23/2020 [facility]: [facility]   Brief Narrative: 69 year old male admitted with acute change in mental status likely secondary to acute hypoxic respiratory failure, AKI and profound hypernatremia.  He was found to have a sodium of 180 on admission with a creatinine of 4.27 and pneumonia.  He was admitted on 05/23/2020.  Patient lives at [facility] nursing [facility].  Patient was intubated in the ER and extubated 05/25/2020.  He has history of dementia, stroke, COPD. 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  He was Covid negative, influenza AMB negative.  He was treated with Rocephin and azithromycin. Seen by speech therapy patient is moderate aspiration risk And risk for inadequate nutrition and hydration.  Assessment & Plan:   Active Problems:   Protein-calorie malnutrition, severe (HCC)   Sepsis (HCC)   CAP (community acquired pneumonia)   #1 severe hypernatremia due to decreased p.o. intake at the nursing home.  Patient also at high risk for aspiration on thickened water, but still continues to aspirate as he has a wet cough and coughs with eating.  He is on D5 at 75 cc an hour. SODIUM 144 FROM 180.  #2  Status post acute hypoxic respiratory failure due to pneumonia-patient received Rocephin azithromycin for 5 days.  Continue Brovana and Pulmicort and aspiration precautions.  Repeat chest x-ray 05/31/2020 with left lower lobe infiltrates consistent with pneumonia started on Vanco and cefepime.  #3 AKI due to dehydration renal functions improving continue IV fluids.  Creatinine is 1.39 from 4.36 on admission.  #4 status post rhabdomyolysis resolved   #5 acute metabolic encephalopathy in the setting of dementia and stroke, secondary to severe dehydration and hypernatremia. Patient's daughter reports he is normally awake alert walking talking . He is awake today  but he tells me he does not want to drink or eat.    #6 thrombocytopenia -on heparin subcu monitor.  #7 hypokalemia/hypophosphatemia -potassium 3.1 phosphorus 1.9.  Replete and recheck.    #8ischemic wounds to bilateral lower extremity with skin breakdown with diminished pulses and cold to touch.  Arterial Doppler 50 to 74% stenosis noted in the superficial femoral artery left lower extremity, monophasic flow in the right lower extremity with possible aortoiliac disease.  Appreciate vascular surgery input.  Family does not want amputation to be done.   #9 goals of care discussed in detail with patient's healthcare power of attorney his daughter.  He or she would not want him to be on life support or go through CPR.  Consulted palliative care.  Appreciate input.  Nutrition Problem: Severe Malnutrition Etiology: chronic illness (CVA and dementia)     Signs/Symptoms: moderate fat depletion, moderate muscle depletion, severe fat depletion, severe muscle depletion    Interventions: Tube feeding  Estimated body mass index is 16.19 kg/m as calculated from the following:   Height as of this encounter: 5\' 8"  (1.727 m).   Weight as of this encounter: 48.3 kg.  DVT prophylaxis: None due to thrombocytopenia and bilateral lower extremity ischemia code Status: Full code Family Communication: Attempted to call daughter Alcario Drought today with no response Disposition Plan:  Status is: Inpatient  Dispo: The patient is from: SNF              Anticipated d/c is to: SNF              Anticipated d/c date is: > 3 days  PROGRESS NOTE    Jose Schaefer  WUJ:[id number] DOB: 1950/11/11 DOA: 05/23/2020 [facility]: [facility]   Brief Narrative: 69 year old male admitted with acute change in mental status likely secondary to acute hypoxic respiratory failure, AKI and profound hypernatremia.  He was found to have a sodium of 180 on admission with a creatinine of 4.27 and pneumonia.  He was admitted on 05/23/2020.  Patient lives at [facility] nursing home.  Patient was intubated in the ER and extubated 05/25/2020.  He has history of dementia, stroke, COPD. 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  He was Covid negative, influenza AMB negative.  He was treated with Rocephin and azithromycin. Seen by speech therapy patient is moderate aspiration risk And risk for inadequate nutrition and hydration.  Assessment & Plan:   Active Problems:   Protein-calorie malnutrition, severe (HCC)   Sepsis (HCC)   CAP (community acquired pneumonia)   #1 severe hypernatremia due to decreased p.o. intake at the nursing home.  Patient also at high risk for aspiration on thickened water, but still continues to aspirate as he has a wet cough and coughs with eating.  He is on D5 at 75 cc an hour. SODIUM 144 FROM 180.  #2  Status post acute hypoxic respiratory failure due to pneumonia-patient received Rocephin azithromycin for 5 days.  Continue Brovana and Pulmicort and aspiration precautions.  Repeat chest x-ray 05/31/2020 with left lower lobe infiltrates consistent with pneumonia started on Vanco and cefepime.  #3 AKI due to dehydration renal functions improving continue IV fluids.  Creatinine is 1.39 from 4.36 on admission.  #4 status post rhabdomyolysis resolved   #5 acute metabolic encephalopathy in the setting of dementia and stroke, secondary to severe dehydration and hypernatremia. Patient's daughter reports he is normally awake alert walking talking . He is awake today  but he tells me he does not want to drink or eat.    #6 thrombocytopenia -on heparin subcu monitor.  #7 hypokalemia/hypophosphatemia -potassium 3.1 phosphorus 1.9.  Replete and recheck.    #8ischemic wounds to bilateral lower extremity with skin breakdown with diminished pulses and cold to touch.  Arterial Doppler 50 to 74% stenosis noted in the superficial femoral artery left lower extremity, monophasic flow in the right lower extremity with possible aortoiliac disease.  Appreciate vascular surgery input.  Family does not want amputation to be done.   #9 goals of care discussed in detail with patient's healthcare power of attorney his daughter.  He or she would not want him to be on life support or go through CPR.  Consulted palliative care.  Appreciate input.  Nutrition Problem: Severe Malnutrition Etiology: chronic illness (CVA and dementia)     Signs/Symptoms: moderate fat depletion, moderate muscle depletion, severe fat depletion, severe muscle depletion    Interventions: Tube feeding  Estimated body mass index is 16.19 kg/m as calculated from the following:   Height as of this encounter: 5\' 8"  (1.727 m).   Weight as of this encounter: 48.3 kg.  DVT prophylaxis: None due to thrombocytopenia and bilateral lower extremity ischemia code Status: Full code Family Communication: Attempted to call daughter Alcario Drought today with no response Disposition Plan:  Status is: Inpatient  Dispo: The patient is from: SNF              Anticipated d/c is to: SNF              Anticipated d/c date is: > 3 days  PROGRESS NOTE    Jose Schaefer  WUJ:[id number] DOB: 1950/11/11 DOA: 05/23/2020 [facility]: [facility]   Brief Narrative: 69 year old male admitted with acute change in mental status likely secondary to acute hypoxic respiratory failure, AKI and profound hypernatremia.  He was found to have a sodium of 180 on admission with a creatinine of 4.27 and pneumonia.  He was admitted on 05/23/2020.  Patient lives at [facility] nursing [facility].  Patient was intubated in the ER and extubated 05/25/2020.  He has history of dementia, stroke, COPD. 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  He was Covid negative, influenza AMB negative.  He was treated with Rocephin and azithromycin. Seen by speech therapy patient is moderate aspiration risk And risk for inadequate nutrition and hydration.  Assessment & Plan:   Active Problems:   Protein-calorie malnutrition, severe (HCC)   Sepsis (HCC)   CAP (community acquired pneumonia)   #1 severe hypernatremia due to decreased p.o. intake at the nursing home.  Patient also at high risk for aspiration on thickened water, but still continues to aspirate as he has a wet cough and coughs with eating.  He is on D5 at 75 cc an hour. SODIUM 144 FROM 180.  #2  Status post acute hypoxic respiratory failure due to pneumonia-patient received Rocephin azithromycin for 5 days.  Continue Brovana and Pulmicort and aspiration precautions.  Repeat chest x-ray 05/31/2020 with left lower lobe infiltrates consistent with pneumonia started on Vanco and cefepime.  #3 AKI due to dehydration renal functions improving continue IV fluids.  Creatinine is 1.39 from 4.36 on admission.  #4 status post rhabdomyolysis resolved   #5 acute metabolic encephalopathy in the setting of dementia and stroke, secondary to severe dehydration and hypernatremia. Patient's daughter reports he is normally awake alert walking talking . He is awake today  but he tells me he does not want to drink or eat.    #6 thrombocytopenia -on heparin subcu monitor.  #7 hypokalemia/hypophosphatemia -potassium 3.1 phosphorus 1.9.  Replete and recheck.    #8ischemic wounds to bilateral lower extremity with skin breakdown with diminished pulses and cold to touch.  Arterial Doppler 50 to 74% stenosis noted in the superficial femoral artery left lower extremity, monophasic flow in the right lower extremity with possible aortoiliac disease.  Appreciate vascular surgery input.  Family does not want amputation to be done.   #9 goals of care discussed in detail with patient's healthcare power of attorney his daughter.  He or she would not want him to be on life support or go through CPR.  Consulted palliative care.  Appreciate input.  Nutrition Problem: Severe Malnutrition Etiology: chronic illness (CVA and dementia)     Signs/Symptoms: moderate fat depletion, moderate muscle depletion, severe fat depletion, severe muscle depletion    Interventions: Tube feeding  Estimated body mass index is 16.19 kg/m as calculated from the following:   Height as of this encounter: 5\' 8"  (1.727 m).   Weight as of this encounter: 48.3 kg.  DVT prophylaxis: None due to thrombocytopenia and bilateral lower extremity ischemia code Status: Full code Family Communication: Attempted to call daughter Alcario Drought today with no response Disposition Plan:  Status is: Inpatient  Dispo: The patient is from: SNF              Anticipated d/c is to: SNF              Anticipated d/c date is: > 3 days  PCR NEGATIVE NEGATIVE Final    Comment:        The GeneXpert MRSA Assay (FDA approved for NASAL specimens only), is one component of a comprehensive MRSA colonization surveillance program. It is not intended to diagnose MRSA infection nor to guide or monitor treatment for MRSA infections. Performed at [facility], [address], [place], [place] [address]          Radiology Studies: DG Chest 1 View  Result Date: 05/31/2020 CLINICAL DATA:  New onset cough, 6 days post extubation, history of COPD and dementia EXAM: CHEST  1 VIEW COMPARISON:  Portable exam 1228 hours compared to 05/23/2020 FINDINGS: RIGHT arm PICC line tip projects over SVC. Normal heart size, mediastinal contours, and pulmonary vascularity. Atherosclerotic calcification aorta. Emphysematous changes with progressive LEFT lower lobe infiltrate consistent with pneumonia. No pleural effusion or pneumothorax. Bones demineralized. IMPRESSION: Emphysematous changes with progressive LEFT lower lobe infiltrate consistent with pneumonia. Electronically Signed   By: Ulyses Southward M.D.   On: 05/31/2020 16:33   ECHOCARDIOGRAM COMPLETE  Result Date: 05/31/2020    ECHOCARDIOGRAM REPORT   Patient Name:   HIEN PERREIRA Date of Exam: 05/31/2020 Medical Rec #:  [id number]   Height:       68.0 in Accession #:    [id number]   Weight:       110.9 lb Date of Birth:  [birth year]    BSA:          1.591 m Patient Age:    69 years    BP:           110/57 mmHg Patient Gender: M           HR:           89 bpm. Exam Location:  Inpatient Procedure: 2D Echo Indications:    Abnormal ECG 794.31 / R94.31  History:        Patient has prior history of Echocardiogram examinations, most                 recent 08/27/2019. Protein-calorie malnutrition, Sepsis,                 Pneumonia, Acute metabolic encephalopathy, Thrombocytopenia.  Sonographer:    Leta Jungling RDCS Referring Phys: [id number] Shelbee Apgar G Zaydyn Havey IMPRESSIONS  1. Left ventricular ejection fraction, by estimation, is >75%. The left ventricle has hyperdynamic function. The left ventricle has no regional wall motion abnormalities. Left ventricular diastolic parameters are consistent with Grade I diastolic dysfunction (impaired relaxation).  2. Right ventricular systolic function is normal. The right ventricular size is normal. There is normal pulmonary artery systolic pressure.  3. The mitral valve is normal in structure. No evidence of mitral valve regurgitation. No evidence of mitral stenosis.  4. The aortic valve is tricuspid. Aortic valve regurgitation is mild. No aortic stenosis is present.  5. The inferior vena cava is normal in size with greater than 50% respiratory variability, suggesting right atrial pressure of 3 mmHg. FINDINGS  Left Ventricle: Left ventricular ejection fraction, by estimation, is >75%. The left ventricle has hyperdynamic function. The left ventricle has no regional wall motion abnormalities. The left ventricular internal cavity size was normal in size. There is no left ventricular hypertrophy. Left ventricular diastolic parameters are consistent with Grade I diastolic dysfunction (impaired relaxation). Normal left ventricular filling pressure. Right Ventricle: The right ventricular size is normal. No increase in right ventricular wall thickness. Right ventricular  systolic function is normal. There is normal pulmonary  PROGRESS NOTE    Jose Schaefer  WUJ:[id number] DOB: 1950/11/11 DOA: 05/23/2020 PCP: [facility]   Brief Narrative: 69 year old male admitted with acute change in mental status likely secondary to acute hypoxic respiratory failure, AKI and profound hypernatremia.  He was found to have a sodium of 180 on admission with a creatinine of 4.27 and pneumonia.  He was admitted on 05/23/2020.  Patient lives at [facility] nursing home.  Patient was intubated in the ER and extubated 05/25/2020.  He has history of dementia, stroke, COPD. 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  He was Covid negative, influenza AMB negative.  He was treated with Rocephin and azithromycin. Seen by speech therapy patient is moderate aspiration risk And risk for inadequate nutrition and hydration.  Assessment & Plan:   Active Problems:   Protein-calorie malnutrition, severe (HCC)   Sepsis (HCC)   CAP (community acquired pneumonia)   #1 severe hypernatremia due to decreased p.o. intake at the nursing home.  Patient also at high risk for aspiration on thickened water, but still continues to aspirate as he has a wet cough and coughs with eating.  He is on D5 at 75 cc an hour. SODIUM 144 FROM 180.  #2  Status post acute hypoxic respiratory failure due to pneumonia-patient received Rocephin azithromycin for 5 days.  Continue Brovana and Pulmicort and aspiration precautions.  Repeat chest x-ray 05/31/2020 with left lower lobe infiltrates consistent with pneumonia started on Vanco and cefepime.  #3 AKI due to dehydration renal functions improving continue IV fluids.  Creatinine is 1.39 from 4.36 on admission.  #4 status post rhabdomyolysis resolved   #5 acute metabolic encephalopathy in the setting of dementia and stroke, secondary to severe dehydration and hypernatremia. Patient's daughter reports he is normally awake alert walking talking . He is awake today  but he tells me he does not want to drink or eat.    #6 thrombocytopenia -on heparin subcu monitor.  #7 hypokalemia/hypophosphatemia -potassium 3.1 phosphorus 1.9.  Replete and recheck.    #8ischemic wounds to bilateral lower extremity with skin breakdown with diminished pulses and cold to touch.  Arterial Doppler 50 to 74% stenosis noted in the superficial femoral artery left lower extremity, monophasic flow in the right lower extremity with possible aortoiliac disease.  Appreciate vascular surgery input.  Family does not want amputation to be done.   #9 goals of care discussed in detail with patient's healthcare power of attorney his daughter.  He or she would not want him to be on life support or go through CPR.  Consulted palliative care.  Appreciate input.  Nutrition Problem: Severe Malnutrition Etiology: chronic illness (CVA and dementia)     Signs/Symptoms: moderate fat depletion, moderate muscle depletion, severe fat depletion, severe muscle depletion    Interventions: Tube feeding  Estimated body mass index is 16.19 kg/m as calculated from the following:   Height as of this encounter: 5\' 8"  (1.727 m).   Weight as of this encounter: 48.3 kg.  DVT prophylaxis: None due to thrombocytopenia and bilateral lower extremity ischemia code Status: Full code Family Communication: Attempted to call daughter Alcario Drought today with no response Disposition Plan:  Status is: Inpatient  Dispo: The patient is from: SNF              Anticipated d/c is to: SNF              Anticipated d/c date is: > 3 days

## 2020-06-01 NOTE — Consult Note (Signed)
Palliative care consult  Reason for consult: Goals of care in light of dead lower extremity bilaterally and mental status changes with impaired p.o. intake and continued dysphagia/aspiration risk  Palliative care consult received.  Chart reviewed including personal review of pertinent labs and imaging.  Briefly, Mr. Nibert is a 69 year old male with past medical history of dementia, CVA, COPD, and tobacco abuse who presented from assisted living facility where he lives ([place]) with altered mental status, hypernatremia and suspected pneumonia.  He was subsequently found to have bilateral lower extremity ischemic changes and was evaluated by vascular surgery for reports that he would need bilateral lower extremity amputation if aggressive care is sought.  Additionally, he was admitted with severe dehydration with hypernatremia and is reported to refuse to drink thickened water which is currently recommended diet for him.  Palliative consulted for goals of care.  I saw and examined Mr. Sayas.  He was lying in bed in no distress but was not really able to wake up and participate in conversation.  He is very frail and limited interaction.  He does have facial droop that his daughter reports is new in the past couple of weeks.  I met today with his daughter Coralie Keens outside of his room.  I introduced palliative care as specialized medical care for people living with serious illness. It focuses on providing relief from the symptoms and stress of a serious illness. The goal is to improve quality of life for both the patient and the family.  Coralie Keens reports that her father is an independent man who lives with her up until July of this past year but he began to wander due to his dementia and was transition to assisted living facility.  Since being there, she reports that he had been able to move around independently and interact with other residents without difficulty.  She was then notified the Wednesday  before last that his mental status had changed and he was not up and moving around.  As this persisted, she had him transferred to [facility] for evaluation he was subsequently sent to [facility].  We discussed clinical course as well as wishes moving forward in regard to advanced directives.  Concepts specific to code status and care plan is hospitalization discussed.  We discussed difference between a aggressive medical intervention path and a palliative, comfort focused care path.  Values and goals of care important to patient and family were attempted to be elicited.  Concept of Hospice and Palliative Care were discussed  Questions and concerns addressed.   PMT will continue to support holistically.  Recommendations: -DNR/DNI -His daughter reports understanding the severity of his condition and the fact that he is facing to "nonfixable" problems including his lower extremity ischemia and his poor nutrition with dysphagia/aspiration.  At the same time, she reports that she is not at a point where she can "give up" on him.  She feels that he had what he would consider to be good quality of life up until around a week and a half ago. -She would like clarification on if he has a blood clot as reason for his ischemia (she notes that she was told this by somebody).  She is not interested in consideration for amputation. -She also feels that there has to be an explanation as to why his mental status change so acutely on the Wednesday prior to his admission.  She feels that she needs to be confident that there is not a chance  that his mental status will improve prior to making decision about any transition to more of a comfort focus. -Currently she is not interested in hospice support. -We are planning for follow-up meeting tomorrow around 11:30 AM.  Start time: 1415 End time 1530 Total time: 75 minutes  Greater than 50%  of this time was spent counseling and coordinating care related to  the above assessment and plan.  Micheline Rough, MD [facility] [phone]

## 2020-06-01 NOTE — Progress Notes (Signed)
Daily Progress Note   Patient Name: Jose Schaefer       Date: 06/01/2020 DOB: [birth year]  Age: 69 y.o. MRN#: [id number] Attending Physician: Georgette Shell, MD Primary Care Physician: [facility] Admit Date: 05/23/2020  Reason for Consultation/Follow-up: Establishing goals of care  Subjective: Mr. Hallum was lying in bed in no acute distress. He was unable to participate in conversation regarding goals of care.  I met today with patient's daughter and longtime friend, Shanon Brow. See below for details of conversation.  Length of Stay: 9  Current Medications: Scheduled Meds:  . arformoterol  15 mcg Nebulization BID  . aspirin  81 mg Per Tube Daily  . budesonide (PULMICORT) nebulizer solution  0.5 mg Nebulization BID  . chlorhexidine  15 mL Mouth Rinse BID  . Chlorhexidine Gluconate Cloth  6 each Topical Daily  . clopidogrel  75 mg Per Tube Daily  . docusate  100 mg Per Tube BID  . [START ON 06/02/2020] feeding supplement (NEPRO CARB STEADY)  237 mL Oral Q24H  . heparin  5,000 Units Subcutaneous Q8H  . ipratropium-albuterol  3 mL Nebulization BID  . mouth rinse  15 mL Mouth Rinse q12n4p  . multivitamin with minerals  1 tablet Oral Daily  . sodium chloride flush  10-40 mL Intracatheter Q12H    Continuous Infusions: . ceFEPime (MAXIPIME) IV 2 g (06/01/20 1741)  . dextrose 75 mL/hr at 06/01/20 0800  . vancomycin 750 mg (06/01/20 1836)    PRN Meds: acetaminophen, ipratropium-albuterol, Resource ThickenUp Clear, sodium chloride flush  Physical Exam       General: Somnolent, in no acute distress.  HEENT: No bruits, no goiter, no JVD Heart: Regular rate and rhythm. No murmur appreciated. Lungs: Good air movement, clear Abdomen: Soft, nontender, nondistended, positive  bowel sounds.  Ext: Deferred examination (I did review photos from admission with his friend Shanon Brow today) Skin: Warm and dry  Vital Signs: BP 106/72 (BP Location: Left Arm)   Pulse (!) 102   Temp 97.9 F (36.6 C) (Oral)   Resp 19   Ht $R'5\' 8"'Cu$  (1.727 m)   Wt 48.3 kg   SpO2 97%   BMI 16.19 kg/m  SpO2: SpO2: 97 % O2 Device: O2 Device: Room Air O2 Flow Rate: O2 Flow Rate (L/min): 2 L/min  Intake/output summary:  Intake/Output Summary (Last 24 hours) at 06/01/2020 2333 Last data filed at 06/01/2020 2303 Gross per 24 hour  Intake 1276.56 ml  Output 2595 ml  Net -1318.44 ml   LBM: Last BM Date: 05/30/20 Baseline Weight: Weight: 55 kg Most recent weight: Weight: 48.3 kg       Palliative Assessment/Data:    Flowsheet Rows     Most Recent Value  Intake Tab  Referral Department Hospitalist  Unit at Time of Referral ICU  Palliative Care Primary Diagnosis Neurology  Date Notified 05/30/20  Palliative Care Type New Palliative care  Reason for referral Clarify Goals of Care  Date of Admission 05/23/20  Date first seen by Palliative Care 05/31/20  # of days Palliative referral response time 1 Day(s)  # of days IP prior to Palliative referral 7  Clinical Assessment  Palliative Performance Scale Score 30%  Psychosocial & Spiritual Assessment  Palliative Care Outcomes  Patient/Family meeting held? Yes  Who was at the meeting? Daughter      Patient Active Problem List   Diagnosis Date Noted  . Sepsis ([place]) 05/23/2020  . CAP (community acquired pneumonia) 05/23/2020  . Dementia ([place]) 08/27/2019  . AMS (altered mental status) 08/27/2019  . Altered mental status/Metabolic Encephalopathy due to Acute CVA 08/27/2019  . Aspiration pneumonia of right lung ([place])   . Esophagitis   . Abnormal CT scan, esophagus   . Protein-calorie malnutrition, severe ([place]) 10/10/2018  . COPD exacerbation ([place]) 10/09/2018  . Dysphagia 10/09/2018  . Aspiration into airway 10/09/2018  . Lactic  acidosis 10/09/2018  . Cerebral infarction due to unspecified mechanism   . CVA/-Small acute lacunar infarct in the left posterior lentiform/Internal Capsule 08/11/2015  . Cocaine abuse ([place]) 08/11/2015  . ARF (acute renal failure) ([place]) 08/11/2015  . Acute respiratory failure ([place]) 06/26/2011  . COPD (chronic obstructive pulmonary disease) with acute bronchitis ([place]) 06/26/2011  . Tobacco abuse   . Asthma     Palliative Care Assessment & Plan   Patient Profile: Mr. Sleight is a 69 year old male with past medical history of dementia, CVA, COPD, and tobacco abuse who presented from assisted living facility where he lives ([place]) with altered mental status, hypernatremia and suspected pneumonia.  He was subsequently found to have bilateral lower extremity ischemic changes and was evaluated by vascular surgery for reports that he would need bilateral lower extremity amputation if aggressive care is sought.  Additionally, he was admitted with severe dehydration with hypernatremia and is reported to refuse to drink thickened water which is currently recommended diet for him.  Palliative consulted for goals of care.  Assessment: Patient Active Problem List   Diagnosis Date Noted  . Sepsis ([place]) 05/23/2020  . CAP (community acquired pneumonia) 05/23/2020  . Dementia ([place]) 08/27/2019  . AMS (altered mental status) 08/27/2019  . Altered mental status/Metabolic Encephalopathy due to Acute CVA 08/27/2019  . Aspiration pneumonia of right lung ([place])   . Esophagitis   . Abnormal CT scan, esophagus   . Protein-calorie malnutrition, severe ([place]) 10/10/2018  . COPD exacerbation ([place]) 10/09/2018  . Dysphagia 10/09/2018  . Aspiration into airway 10/09/2018  . Lactic acidosis 10/09/2018  . Cerebral infarction due to unspecified mechanism   . CVA/-Small acute lacunar infarct in the left posterior lentiform/Internal Capsule 08/11/2015  . Cocaine abuse ([place]) 08/11/2015  . ARF (acute renal  failure) ([place]) 08/11/2015  . Acute respiratory failure ([place]) 06/26/2011  . COPD (chronic obstructive pulmonary disease) with acute bronchitis ([place]) 06/26/2011  . Tobacco abuse   .  Asthma     Recommendations/Plan:  Goals of care:   Continued discussion today with patient's daughter and longtime friend, Shanon Brow. Reviewed that he continues to have 2 unfixable problems with his lower extremity ischemia and ineffective nutrition and hydration with poor intake and dysphagia. Discussed aspiration again in detail. I reviewed pictures of his ischemic lower extremity with his friend. His daughter declined to review these with [place] but understood when Shanon Brow stated that he understands after seeing pictures that this is a problem that is not going to get better.   His daughter is still struggling with his acute decline. We discussed plan for MRI as she would be more comfortable with the plan to transition to comfort if he is found to have had a stroke. Her main concern is "giving up" if he has a potential problem that could get better. We discussed today regarding potential for residential hospice and that this would be my recommendation for care plan moving forward regardless of results of MRI due to his significant decline in nutrition and functional status. His daughter and Shanon Brow expressed understanding but would like to have results of MRI prior to making decision about next steps. They are aware that I will be transitioning off service today and another member of the palliative care team will follow up with them tomorrow.   Code Status:    Code Status Orders  (From admission, onward)         Start     Ordered   05/30/20 1051  Do not attempt resuscitation (DNR)  Continuous       Question Answer Comment  In the event of cardiac or respiratory ARREST Do not call a "code blue"   In the event of cardiac or respiratory ARREST Do not perform Intubation, CPR, defibrillation or ACLS   In the event of cardiac  or respiratory ARREST Use medication by any route, position, wound care, and other measures to relive pain and suffering. May use oxygen, suction and manual treatment of airway obstruction as needed for comfort.      05/30/20 1050        Code Status History    Date Active Date Inactive Code Status Order ID Comments User Context   05/23/2020 2200 05/30/2020 1050 Full Code [id number]  Juanito Doom, MD Inpatient   08/27/2019 0221 08/27/2019 2028 Full Code [id number]  Lynetta Mare, MD ED   10/09/2018 1622 10/13/2018 1736 Full Code [id number]  Kathie Dike, MD Inpatient   08/11/2015 1716 08/13/2015 1845 Full Code [id number]  Erline Hau, MD Inpatient   06/26/2011 0543 06/27/2011 1518 Full Code [id number]  Ron Parker, RN Inpatient   Advance Care Planning Activity       Prognosis:   Guarded  Discharge Planning:  To Be Determined  Care plan was discussed with daughter, friend Shanon Brow), and Dr. Rodena Piety  Thank you for allowing the Palliative Medicine Team to assist in the care of this patient.   Time In: 1000 Time Out: 1050 Total Time 50 Prolonged Time Billed No      Greater than 50%  of this time was spent counseling and coordinating care related to the above assessment and plan.  Micheline Rough, MD  Please contact Palliative Medicine Team phone at [phone] for questions and concerns.

## 2020-06-01 NOTE — Progress Notes (Signed)
Nutrition Follow-up  DOCUMENTATION CODES:   Underweight, Severe malnutrition in context of chronic illness  INTERVENTION:  - continue Nepro Shake but will decrease from TID to once/day. - continue Magic Cup TID.  - will monitor for decisions following Palliative Care follow-up today.   NUTRITION DIAGNOSIS:   Severe Malnutrition related to chronic illness (CVA and dementia) as evidenced by moderate fat depletion, moderate muscle depletion, severe fat depletion, severe muscle depletion. -ongoing  GOAL:   Patient will meet greater than or equal to 90% of their needs -unmet  MONITOR:   PO intake, Supplement acceptance, Labs, Weight trends, Skin, I & O's  ASSESSMENT:   69 y/o male with h/o dementia, substance abuse, CVA and COPD who was admitted 10/30 with AMS in setting of acute hypoxemic respiratory failure with concern for L PNA, AKI  and profound hypernatremia  Calorie Count started on 11/6. No intakes have been documented since that time. Patient remains on FLD, nectar-thick liquids. He was transferred from 2W to 4W about 45 minutes ago. Patient noted to be a/o to self only.   Small bore NGT had been placed on 11/1 and was removed that same night.   Palliative Care is following. Patient is DNR/DNI. Palliative Care note from yesterday states plan for follow-up with patient's daughter again today at ~1130.   Weight has been stable since 11/3. Non-pitting edema to BLE documented in the flow sheet.     Labs reviewed; CBGs: 105 and 87 mg/dl, K: 3.1 mmol/l, Mg and Phos low end of normal, Cl: 116 mmol/l, creatinine: 10.39 mg/dl, Ca: 7.3 mg/dl, GFR: 55 ml/min. Medications reviewed; 100 mg colace BID, 1 tablet multivitamin with minerals/day, 10 mEq IV KCl x4 runs 11/8. IVF; D5 @ 75 ml/hr (306 kcal).    Diet Order:   Diet Order            Diet full liquid Room service appropriate? No; Fluid consistency: Nectar Thick  Diet effective now                 EDUCATION NEEDS:   No  education needs have been identified at this time  Skin:  Skin Assessment: Skin Integrity Issues: Skin Integrity Issues:: Other (Comment) Other: non-pressure injuries (open blister) to bilateral anterior feet  Last BM:  11/6 (type 7)  Height:   Ht Readings from Last 1 Encounters:  05/23/20 5\' 8"  (1.727 m)    Weight:   Wt Readings from Last 1 Encounters:  06/01/20 48.3 kg     Estimated Nutritional Needs:  Kcal:  1610-1760 kcal Protein:  75-85 grams Fluid:  >/= 1.7 L/day     13/08/21, MS, RD, LDN, CNSC Inpatient Clinical Dietitian RD pager # available in [facility]  After hours/weekend pager # available in [facility]

## 2020-06-01 NOTE — Progress Notes (Signed)
Outstanding failed attempt at MRI. VERY limited MRI of the brain completed (only scout performed). Patient started punch the scanner and then attempted to roll of the table. MRI was aborted.

## 2020-06-02 LAB — PHOSPHORUS: Phosphorus: 2.7 mg/dL (ref 2.5–4.6)

## 2020-06-02 LAB — BASIC METABOLIC PANEL
Anion gap: 6 (ref 5–15)
BUN: 14 mg/dL (ref 8–23)
CO2: 21 mmol/L — ABNORMAL LOW (ref 22–32)
Calcium: 7.7 mg/dL — ABNORMAL LOW (ref 8.9–10.3)
Chloride: 114 mmol/L — ABNORMAL HIGH (ref 98–111)
Creatinine, Ser: 1.39 mg/dL — ABNORMAL HIGH (ref 0.61–1.24)
GFR, Estimated: 55 mL/min — ABNORMAL LOW (ref >60)
Glucose, Bld: 102 mg/dL — ABNORMAL HIGH (ref 70–99)
Potassium: 2.9 mmol/L — ABNORMAL LOW (ref 3.5–5.1)
Sodium: 141 mmol/L (ref 135–145)

## 2020-06-02 LAB — MAGNESIUM: Magnesium: 1.7 mg/dL (ref 1.7–2.4)

## 2020-06-02 LAB — GLUCOSE, CAPILLARY
Glucose-Capillary: 104 mg/dL — ABNORMAL HIGH (ref 70–99)
Glucose-Capillary: 105 mg/dL — ABNORMAL HIGH (ref 70–99)
Glucose-Capillary: 109 mg/dL — ABNORMAL HIGH (ref 70–99)
Glucose-Capillary: 110 mg/dL — ABNORMAL HIGH (ref 70–99)
Glucose-Capillary: 110 mg/dL — ABNORMAL HIGH (ref 70–99)
Glucose-Capillary: 97 mg/dL (ref 70–99)

## 2020-06-02 MED ORDER — POTASSIUM CHLORIDE CRYS ER 20 MEQ PO TBCR
40.0000 meq | EXTENDED_RELEASE_TABLET | Freq: Three times a day (TID) | ORAL | Status: DC
  Administered 2020-06-02: 40 meq via ORAL
  Filled 2020-06-02: qty 2

## 2020-06-02 NOTE — Progress Notes (Signed)
Daily Progress Note   Patient Name: Jose Schaefer       Date: 06/02/2020 DOB: 07/13/1951  Age: 69 y.o. MRN#: [id number] Attending Physician: Alwyn Ren, MD Primary Care Physician: [facility] Admit Date: 05/23/2020  Reason for Consultation/Follow-up: Establishing goals of care  Subjective: Jose Schaefer was lying in bed in no acute distress. He nods his head yes/no but otherwise does not verbalize or communicate.  Call placed and discussed with daughter Alcario Drought.  So discussed with TR[facility] MD.   I reviewed the MRI of the brain that was attempted last night, I reviewed MRI from earlier this year as well with both daughter as well as with primary attending.  Length of Stay: 10  Current Medications: Scheduled Meds:  . arformoterol  15 mcg Nebulization BID  . aspirin  81 mg Per Tube Daily  . budesonide (PULMICORT) nebulizer solution  0.5 mg Nebulization BID  . chlorhexidine  15 mL Mouth Rinse BID  . Chlorhexidine Gluconate Cloth  6 each Topical Daily  . clopidogrel  75 mg Per Tube Daily  . docusate  100 mg Per Tube BID  . feeding supplement (NEPRO CARB STEADY)  237 mL Oral Q24H  . heparin  5,000 Units Subcutaneous Q8H  . ipratropium-albuterol  3 mL Nebulization BID  . mouth rinse  15 mL Mouth Rinse q12n4p  . multivitamin with minerals  1 tablet Oral Daily  . potassium chloride  40 mEq Oral TID  . sodium chloride flush  10-40 mL Intracatheter Q12H    Continuous Infusions: . ceFEPime (MAXIPIME) IV 2 g (06/02/20 0610)  . dextrose 75 mL/hr at 06/02/20 0111  . vancomycin 750 mg (06/01/20 1836)    PRN Meds: acetaminophen, ipratropium-albuterol, Resource ThickenUp Clear, sodium chloride flush  Physical Exam       General: Awake in no acute distress.  HEENT: No bruits, no  goiter, no JVD Heart: Regular rate and rhythm. No murmur appreciated. Lungs: Good air movement, clear Abdomen: Soft, nontender, nondistended, positive bowel sounds.  Ext: contractures Skin: Warm and dry  Vital Signs: BP (!) 100/57 (BP Location: Left Arm)   Pulse 94   Temp 98 F (36.7 C) (Oral)   Resp 18   Ht 5\' 8"  (1.727 m)   Wt 46.5 kg   SpO2 96%   BMI 15.60 kg/m  SpO2: SpO2: 96 % O2 Device: O2 Device: Room Air O2 Flow Rate: O2 Flow Rate (L/min): 2 L/min  Intake/output summary:   Intake/Output Summary (Last 24 hours) at 06/02/2020 1301 Last data filed at 06/02/2020 0443 Gross per 24 hour  Intake 1611.23 ml  Output 1320 ml  Net 291.23 ml   LBM: Last BM Date: 05/30/20 Baseline Weight: Weight: 55 kg Most recent weight: Weight: 46.5 kg       Palliative Assessment/Data:    Flowsheet Rows     Most Recent Value  Intake Tab  Referral Department Hospitalist  Unit at Time of Referral ICU  Palliative Care Primary Diagnosis Neurology  Date Notified 05/30/20  Palliative Care Type New Palliative care  Reason for referral Clarify Goals of Care  Date of Admission 05/23/20  Date first seen by Palliative Care 05/31/20  # of days Palliative referral response time 1 Day(s)  # of days IP prior to Palliative referral 7  Clinical Assessment  Palliative Performance Scale Score 30%  Psychosocial & Spiritual Assessment  Palliative Care Outcomes  Patient/Family meeting held? Yes  Who was at the meeting? Daughter      Patient Active Problem List   Diagnosis Date Noted  . Sepsis (HCC) 05/23/2020  . CAP (community acquired pneumonia) 05/23/2020  . Dementia (HCC) 08/27/2019  . AMS (altered mental status) 08/27/2019  . Altered mental status/Metabolic Encephalopathy due to Acute CVA 08/27/2019  . Aspiration pneumonia of right lung (HCC)   . Esophagitis   . Abnormal CT scan, esophagus   . Protein-calorie malnutrition, severe (HCC) 10/10/2018  . COPD exacerbation (HCC) 10/09/2018   . Dysphagia 10/09/2018  . Aspiration into airway 10/09/2018  . Lactic acidosis 10/09/2018  . Cerebral infarction due to unspecified mechanism   . CVA/-Small acute lacunar infarct in the left posterior lentiform/Internal Capsule 08/11/2015  . Cocaine abuse (HCC) 08/11/2015  . ARF (acute renal failure) (HCC) 08/11/2015  . Acute respiratory failure (HCC) 06/26/2011  . COPD (chronic obstructive pulmonary disease) with acute bronchitis (HCC) 06/26/2011  . Tobacco abuse   . Asthma     Palliative Care Assessment & Plan   Patient Profile: Jose Schaefer is a 69 year old male with past medical history of dementia, CVA, COPD, and tobacco abuse who presented from assisted living facility where he lives [facility]) with altered mental status, hypernatremia and suspected pneumonia.  He was subsequently found to have bilateral lower extremity ischemic changes and was evaluated by vascular surgery for reports that he would need bilateral lower extremity amputation if aggressive care is sought.  Additionally, he was admitted with severe dehydration with hypernatremia and is reported to refuse to drink thickened water which is currently recommended diet for him.  Palliative consulted for goals of care.  Assessment: Patient Active Problem List   Diagnosis Date Noted  . Sepsis (HCC) 05/23/2020  . CAP (community acquired pneumonia) 05/23/2020  . Dementia (HCC) 08/27/2019  . AMS (altered mental status) 08/27/2019  . Altered mental status/Metabolic Encephalopathy due to Acute CVA 08/27/2019  . Aspiration pneumonia of right lung (HCC)   . Esophagitis   . Abnormal CT scan, esophagus   . Protein-calorie malnutrition, severe (HCC) 10/10/2018  . COPD exacerbation (HCC) 10/09/2018  . Dysphagia 10/09/2018  . Aspiration into airway 10/09/2018  . Lactic acidosis 10/09/2018  . Cerebral infarction due to unspecified mechanism   . CVA/-Small acute lacunar infarct in the left posterior lentiform/Internal Capsule  08/11/2015  . Cocaine abuse (HCC) 08/11/2015  . ARF (acute renal failure) (HCC)  08/11/2015  . Acute respiratory failure (HCC) 06/26/2011  . COPD (chronic obstructive pulmonary disease) with acute bronchitis (HCC) 06/26/2011  . Tobacco abuse   . Asthma     Recommendations/Plan:  Goals of care:   Continued discussion today with patient's daughter about results of MRI, patient's current condition and acute and chronic issues causing ongoing decline and compromise quality of life.  Goals wishes and values explored.    Patient's daughter states that she is very thankful for the care the patient has received over here at [facility], she states that she has come to be in an accepted state of mind about the nature of the patient's suffering and would like to request for [place] residential hospice facility at the time of discharge.  Will request TOC consultation.  Discussed with patient's daughter about concept of comfort measures only, visitor access on comfort measures.  Code Status:    Code Status Orders  (From admission, onward)         Start     Ordered   05/30/20 1051  Do not attempt resuscitation (DNR)  Continuous       Question Answer Comment  In the event of cardiac or respiratory ARREST Do not call a "code blue"   In the event of cardiac or respiratory ARREST Do not perform Intubation, CPR, defibrillation or ACLS   In the event of cardiac or respiratory ARREST Use medication by any route, position, wound care, and other measures to relive pain and suffering. May use oxygen, suction and manual treatment of airway obstruction as needed for comfort.      05/30/20 1050        Code Status History    Date Active Date Inactive Code Status Order ID Comments User Context   05/23/2020 2200 05/30/2020 1050 Full Code [id number]  Lupita Leash, MD Inpatient   08/27/2019 0221 08/27/2019 2028 Full Code [id number]  Olga Coaster, MD ED   10/09/2018 1622 10/13/2018 1736 Full  Code [id number]  Erick Blinks, MD Inpatient   08/11/2015 1716 08/13/2015 1845 Full Code [id number]  Henderson Cloud, MD Inpatient   06/26/2011 0543 06/27/2011 1518 Full Code [id number]  Bevely Palmer, RN Inpatient   Advance Care Planning Activity       Prognosis:   Guarded, less than 2 weeks  Discharge Planning:  [facility] hospice facility as per daughter's request  Care plan was discussed with daughter and Dr. Jerolyn Center  Thank you for allowing the Palliative Medicine Team to assist in the care of this patient.   Time In: 12 Time Out: 12.35 Total Time 35 Prolonged Time Billed No      Greater than 50%  of this time was spent counseling and coordinating care related to the above assessment and plan.  Rosalin Hawking, MD  Please contact Palliative Medicine Team phone at [phone] for questions and concerns.

## 2020-06-02 NOTE — Care Management Important Message (Signed)
Important Message  Patient Details IM Letter given to the Patient Name: Jose Schaefer MRN: [id number] Date of Birth: 11-Sep-1950   Medicare Important Message Given:  Yes     Caren Macadam 06/02/2020, 9:47 AM

## 2020-06-02 NOTE — Progress Notes (Signed)
OT Cancellation Note  Patient Details Name: Jose Schaefer MRN: [id number] DOB: January 28, 1951   Cancelled Treatment:    Reason Eval/Treat Not Completed: Other (comment) Per chart review patient's family have decided for patient to transition to hospice care, will discontinue acute OT services at this time. Please re-consult if new needs arise.  Marlyce Huge OT OT pager: [phone]   Carmelia Roller 06/02/2020, 3:32 PM

## 2020-06-02 NOTE — Progress Notes (Signed)
PROGRESS NOTE    Jose Schaefer  VEL:[id number] DOB: June 05, 1951 DOA: 05/23/2020 PCP: [facility]   Brief Narrative: [facility] pickup 05/29/2020- 69 year old male admitted 05/23/2020 with acute change in mental status likely secondary to acute hypoxic respiratory failure, AKI and profound hypernatremia.  He was found to have a sodium of 180 on admission with a creatinine of 4.27 and pneumonia.  He was admitted on 05/23/2020.  Patient lives at [facility] nursing home.  Patient was intubated in the ER and extubated 05/25/2020.  He has history of dementia, stroke, COPD. 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  He was Covid negative, influenza AMB negative.  He was treated with Rocephin and azithromycin. Seen by speech therapy patient is moderate aspiration risk And risk for inadequate nutrition and hydration.  Assessment & Plan:   Active Problems:   Protein-calorie malnutrition, severe (HCC)   Sepsis (HCC)   CAP (community acquired pneumonia)   #1 severe hypernatremia due to decreased p.o. intake at the nursing home.  On admission his sodium was 180.  Patient also at high risk for aspiration on thickened water, but still continues to aspirate as he has a wet cough and coughs with eating.   #2  Status post acute hypoxic respiratory failure due to pneumonia-patient received Rocephin azithromycin for 5 days.  Continue Brovana and Pulmicort and aspiration precautions.  Repeat chest x-ray 05/31/2020 with left lower lobe infiltrates consistent with pneumonia WAS started on Vanco and cefepime.  #3 AKI due to dehydration renal functions improving continue IV fluids.  Creatinine is 1.39 from 4.36 on admission.  #4 status post rhabdomyolysis resolved   #5 acute metabolic encephalopathy in the setting of dementia and stroke, secondary to severe dehydration and hypernatremia.  #6 thrombocytopenia   #7 hypokalemia/hypophosphatemia  #8ischemic  wounds to bilateral lower extremity with skin breakdown with diminished pulses and cold to touch.  Arterial Doppler 50 to 74% stenosis noted in the superficial femoral artery left lower extremity, monophasic flow in the right lower extremity with possible aortoiliac disease.  Appreciate vascular surgery input.  Family does not want amputation to be done.   #9 goals of care discussed in detail with patient's healthcare power of attorney his daughter.  Appreciate palliative care input.  Patient has guarded prognosis.  Daughter would like for patient to go to [facility] facility. Nutrition Problem: Severe Malnutrition Etiology: chronic illness (CVA and dementia)     Signs/Symptoms: moderate fat depletion, moderate muscle depletion, severe fat depletion, severe muscle depletion    Interventions: Tube feeding  Estimated body mass index is 15.6 kg/m as calculated from the following:   Height as of this encounter: 5\' 8"  (1.727 m).   Weight as of this encounter: 46.5 kg.  DVT prophylaxis: None due to thrombocytopenia and bilateral lower extremity ischemia code Status: Full code Family Communication: Attempted to call daughter today with no response Disposition Plan:  Status is: Inpatient  Dispo: The patient is from: SNF              Anticipated d/c is to: Inpatient hospice              Anticipated d/c date Alcario Drought              Patient currently is stable to be discharged    Consultants:   PCCM  Procedures: Intubation and left femoral line Antimicrobials: Rocephin azithromycin finished a course of 5 days  Subjective: Patient appears very weak eyes  open does not speak says yes or no with his head nodding up and down Objective: Vitals:   06/01/20 2258 06/02/20 0434 06/02/20 0837 06/02/20 1325  BP: 106/72 (!) 100/57  103/73  Pulse: (!) 102 94  84  Resp: 19 18  11   Temp: 97.9 F (36.6 C) 98 F (36.7 C)  (!) 97.1 F (36.2 C)  TempSrc: Oral Oral  Oral  SpO2: 97%  96% 96% 100%  Weight:  46.5 kg    Height:        Intake/Output Summary (Last 24 hours) at 06/02/2020 1420 Last data filed at 06/02/2020 0443 Gross per 24 hour  Intake 1611.23 ml  Output 1320 ml  Net 291.23 ml   Filed Weights   05/31/20 0500 06/01/20 0500 06/02/20 0434  Weight: 50.3 kg 48.3 kg 46.5 kg    Examination:  General exam: Appears chronically ill looking Respiratory system: Clear to auscultation. Respiratory effort normal. Cardiovascular system: S1 & S2 heard, RRR. No JVD, murmurs, rubs, gallops or clicks. No pedal edema. Gastrointestinal system: Abdomen is nondistended, soft and nontender. No organomegaly or masses felt. Normal bowel sounds heard. Central nervous system: Awake  extremities: 1+ edema, see pictures from today in the media section with bilateral bluish husky toes with superficial skin involvement. Skin: No rashes, lesions or ulcers Psychiatry: Unable to assess.     Data Reviewed: I have personally reviewed following labs and imaging studies  CBC: Recent Labs  Lab 05/27/20 0305 05/30/20 0328  WBC 10.4 10.0  NEUTROABS 9.1*  --   HGB 10.4* 9.1*  HCT 32.2* 28.7*  MCV 88.0 87.8  PLT PLATELET CLUMPS NOTED ON SMEAR, UNABLE TO ESTIMATE 147*   Basic Metabolic Panel: Recent Labs  Lab 05/29/20 0303 05/29/20 0303 05/30/20 0854 05/30/20 1806 05/31/20 0113 06/01/20 0530 06/02/20 0445  NA 155*   < > 146* 145 137 144 141  K 3.2*   < > 2.8* 3.6 3.0* 3.1* 2.9*  CL 118*   < > 115* 115* 109 116* 114*  CO2 22   < > 20* 24 23 22  21*  GLUCOSE 121*   < > 132* 118* 340* 100* 102*  BUN 31*   < > 23 22 20 17 14   CREATININE 2.02*   < > 1.76* 1.72* 1.59* 1.39* 1.39*  CALCIUM 7.3*   < > 7.1* 7.3* 7.0* 7.3* 7.7*  MG 1.8  --   --   --  1.9 1.8 1.7  PHOS  --   --   --   --  1.9* 2.6 2.7   < > = values in this interval not displayed.   GFR: Estimated Creatinine Clearance: 32.5 mL/min (A) (by C-G formula based on SCr of 1.39 mg/dL (H)). Liver Function  Tests: Recent Labs  Lab 05/27/20 0305 05/29/20 0303  AST 112* 83*  ALT 69* 61*  ALKPHOS 63 65  BILITOT 0.7 0.8  PROT 6.1* 6.0*  ALBUMIN 2.0* 2.0*   No results for input(s): LIPASE, AMYLASE in the last 168 hours. No results for input(s): AMMONIA in the last 168 hours. Coagulation Profile: No results for input(s): INR, PROTIME in the last 168 hours. Cardiac Enzymes: No results for input(s): CKTOTAL, CKMB, CKMBINDEX, TROPONINI in the last 168 hours. BNP (last 3 results) No results for input(s): PROBNP in the last 8760 hours. HbA1C: No results for input(s): HGBA1C in the last 72 hours. CBG: Recent Labs  Lab 06/01/20 1953 06/02/20 0005 06/02/20 0413 06/02/20 0749 06/02/20 1220  GLUCAP 156* 104*  105* 109* 110*   Lipid Profile: No results for input(s): CHOL, HDL, LDLCALC, TRIG, CHOLHDL, LDLDIRECT in the last 72 hours. Thyroid Function Tests: No results for input(s): TSH, T4TOTAL, FREET4, T3FREE, THYROIDAB in the last 72 hours. Anemia Panel: No results for input(s): VITAMINB12, FOLATE, FERRITIN, TIBC, IRON, RETICCTPCT in the last 72 hours. Sepsis Labs: No results for input(s): PROCALCITON, LATICACIDVEN in the last 168 hours.  Recent Results (from the past 240 hour(s))  Culture, blood (single)     Status: None   Collection Time: 05/23/20  3:30 PM   Specimen: BLOOD  Result Value Ref Range Status   Specimen Description BLOOD  Final   Special Requests NONE  Final   Culture   Final    NO GROWTH 5 DAYS Performed at [facility], [address], [place], [place] [address]    Report Status 05/28/2020 FINAL  Final  MRSA PCR Screening     Status: None   Collection Time: 05/23/20 11:27 PM   Specimen: Nasopharyngeal  Result Value Ref Range Status   MRSA by PCR NEGATIVE NEGATIVE Final    Comment:        The GeneXpert MRSA Assay (FDA approved for NASAL specimens only), is one component of a comprehensive MRSA colonization surveillance program. It is not intended to diagnose  MRSA infection nor to guide or monitor treatment for MRSA infections. Performed at [facility], [address] [place], [place] [address]          Radiology Studies: MR BRAIN WO CONTRAST  Result Date: 06/01/2020 CLINICAL DATA:  Mental status change EXAM: MRI HEAD WITHOUT CONTRAST TECHNIQUE: Multiplanar, multiecho pulse sequences of the brain and surrounding structures were obtained without intravenous contrast. COMPARISON:  05/23/2020 head CT and prior. 08/27/2019 MRI head and prior. FINDINGS: Examination was aborted early secondary to patient disposition. Only scout images were obtained, severely limiting evaluation. Scout images demonstrate diffuse cerebral atrophy with ex vacuo dilatation. No evidence of large intracranial mass or fluid collection. No midline shift. Orbits and paranasal sinuses are not well demonstrated secondary to motion artifact. Clear mastoid air cells. Visualized calvarium is intact. IMPRESSION: Aborted examination secondary to patient disposition. Scout images demonstrate no intracranial mass or large fluid collection. Cannot evaluate for intracranial hemorrhage or acute infarct on limited images. Please consider repeat MRI when patient is able to tolerate. Electronically Signed   By: Stana Bunting M.D.   On: 06/01/2020 13:58        Scheduled Meds: . arformoterol  15 mcg Nebulization BID  . budesonide (PULMICORT) nebulizer solution  0.5 mg Nebulization BID  . chlorhexidine  15 mL Mouth Rinse BID  . Chlorhexidine Gluconate Cloth  6 each Topical Daily  . feeding supplement (NEPRO CARB STEADY)  237 mL Oral Q24H  . ipratropium-albuterol  3 mL Nebulization BID  . mouth rinse  15 mL Mouth Rinse q12n4p  . potassium chloride  40 mEq Oral TID  . sodium chloride flush  10-40 mL Intracatheter Q12H   Continuous Infusions: . ceFEPime (MAXIPIME) IV 2 g (06/02/20 0610)  . dextrose 75 mL/hr at 06/02/20 0111  . vancomycin 750 mg (06/01/20 1836)      LOS: 10 days     Alwyn Ren, MD  06/02/2020, 2:20 PM

## 2020-06-02 NOTE — TOC Progression Note (Signed)
Transition of Care [facility]) - Progression Note    Patient Details  Name: Jose Schaefer MRN: [id number] Date of Birth: 11-06-1950  [facility] of [facility]) CM/SW Contact  Geni Bers, RN Phone Number: 06/02/2020, 2:29 PM  Clinical Narrative:    Spoke with pt's daughter Jose Schaefer [phone] concerning Residential Hospice: [facility]. Referral was called to [facility] [phone], clinical faxed to [phone].  Expected Discharge Plan: Skilled Nursing Facility ([facility]) Barriers to Discharge: Barriers Unresolved (comment) (ventilator and sepsis)  Expected Discharge Plan and Services Expected Discharge Plan: Skilled Nursing Facility ([facility])   Discharge Planning Services: CM Consult   Living arrangements for the past 2 months: Skilled Nursing Facility                                       Social Determinants of Health (SDOH) Interventions    Readmission Risk Interventions No flowsheet data found.

## 2020-06-02 NOTE — Progress Notes (Signed)
Nutrition Brief Note  Last follow-up completed by this RD yesterday (11/8). Palliative Care met with patient's daughter yesterday and again today. Chart reviewed. Patient now transitioning to comfort care with plan to discharge to residential hospice and anticipated prognosis of <2 weeks.   No further nutrition interventions warranted at this time. Please re-consult as needed.      Jarome Matin, MS, RD, LDN, CNSC Inpatient Clinical Dietitian RD pager # available in [place]  After hours/weekend pager # available in [facility]

## 2020-06-02 NOTE — Plan of Care (Signed)

## 2020-06-02 NOTE — Progress Notes (Signed)
PT Cancellation Note  Patient Details Name: Elizeo Rodriques MRN: [id number] DOB: [birth year]   Cancelled Treatment:    Reason Eval/Treat Not Completed: Medical issues which prohibited therapy, Palliative Care following for GOC. Will check back another time if PT remains indicated.   [name] 06/02/2020, 7:03 AM  Blanchard Kelch PT Acute Rehabilitation Services Pager [phone] Office [phone]

## 2020-06-03 DIAGNOSIS — R531 Weakness: Secondary | ICD-10-CM

## 2020-06-03 LAB — GLUCOSE, CAPILLARY
Glucose-Capillary: 103 mg/dL — ABNORMAL HIGH (ref 70–99)
Glucose-Capillary: 106 mg/dL — ABNORMAL HIGH (ref 70–99)
Glucose-Capillary: 114 mg/dL — ABNORMAL HIGH (ref 70–99)

## 2020-06-03 MED ORDER — HYDROMORPHONE HCL 1 MG/ML IJ SOLN
0.5000 mg | INTRAMUSCULAR | Status: DC | PRN
  Administered 2020-06-03 – 2020-06-04 (×2): 0.5 mg via INTRAVENOUS
  Filled 2020-06-03 (×3): qty 0.5

## 2020-06-03 NOTE — Progress Notes (Signed)
Daily Progress Note   Patient Name: Jose Schaefer       Date: 06/03/2020 DOB: 1950/10/25  Age: 69 y.o. MRN#: [id number] Attending Physician: Tyrone Nine, MD Primary Care Physician: [facility] Admit Date: 05/23/2020  Reason for Consultation/Follow-up: Establishing goals of care  Subjective: Jose Schaefer was lying in bed in no acute distress. He does not interact with me at all today, resting in bed. No PO intake.    Length of Stay: 11  Current Medications: Scheduled Meds:  . arformoterol  15 mcg Nebulization BID  . budesonide (PULMICORT) nebulizer solution  0.5 mg Nebulization BID  . chlorhexidine  15 mL Mouth Rinse BID  . ipratropium-albuterol  3 mL Nebulization BID  . mouth rinse  15 mL Mouth Rinse q12n4p  . sodium chloride flush  10-40 mL Intracatheter Q12H    Continuous Infusions:   PRN Meds: acetaminophen, HYDROmorphone (DILAUDID) injection, ipratropium-albuterol, Resource ThickenUp Clear, sodium chloride flush  Physical Exam       General: Awake in no acute distress.  HEENT: No bruits, no goiter, no JVD Heart: Regular rate and rhythm. No murmur appreciated. Lungs: Good air movement, clear Abdomen: Soft, nontender, nondistended, positive bowel sounds.  Ext: contractures Skin: Warm and dry  Vital Signs: BP 96/63 (BP Location: Left Arm)   Pulse 98   Temp 98.2 F (36.8 C) (Oral)   Resp 20   Ht 5\' 8"  (1.727 m)   Wt 46.5 kg   SpO2 96%   BMI 15.59 kg/m  SpO2: SpO2: 96 % O2 Device: O2 Device: Nasal Cannula O2 Flow Rate: O2 Flow Rate (L/min): 2 L/min  Intake/output summary:   Intake/Output Summary (Last 24 hours) at 06/03/2020 1053 Last data filed at 06/03/2020 13/04/2020 Gross per 24 hour  Intake 1622.92 ml  Output 800 ml  Net 822.92 ml   LBM: Last BM  Date: 06/02/20 Baseline Weight: Weight: 55 kg Most recent weight: Weight: 46.5 kg       Palliative Assessment/Data:    Flowsheet Rows     Most Recent Value  Intake Tab  Referral Department Hospitalist  Unit at Time of Referral ICU  Palliative Care Primary Diagnosis Neurology  Date Notified 05/30/20  Palliative Care Type New Palliative care  Reason for referral Clarify Goals of Care  Date of Admission 05/23/20  Date  first seen by Palliative Care 05/31/20  # of days Palliative referral response time 1 Day(s)  # of days IP prior to Palliative referral 7  Clinical Assessment  Palliative Performance Scale Score 30%  Psychosocial & Spiritual Assessment  Palliative Care Outcomes  Patient/Family meeting held? Yes  Who was at the meeting? Daughter      Patient Active Problem List   Diagnosis Date Noted  . Sepsis (HCC) 05/23/2020  . CAP (community acquired pneumonia) 05/23/2020  . Dementia (HCC) 08/27/2019  . AMS (altered mental status) 08/27/2019  . Altered mental status/Metabolic Encephalopathy due to Acute CVA 08/27/2019  . Aspiration pneumonia of right lung (HCC)   . Esophagitis   . Abnormal CT scan, esophagus   . Protein-calorie malnutrition, severe (HCC) 10/10/2018  . COPD exacerbation (HCC) 10/09/2018  . Dysphagia 10/09/2018  . Aspiration into airway 10/09/2018  . Lactic acidosis 10/09/2018  . Cerebral infarction due to unspecified mechanism   . CVA/-Small acute lacunar infarct in the left posterior lentiform/Internal Capsule 08/11/2015  . Cocaine abuse (HCC) 08/11/2015  . ARF (acute renal failure) (HCC) 08/11/2015  . Acute respiratory failure (HCC) 06/26/2011  . COPD (chronic obstructive pulmonary disease) with acute bronchitis (HCC) 06/26/2011  . Tobacco abuse   . Asthma     Palliative Care Assessment & Plan   Patient Profile: Jose Schaefer is a 69 year old male with past medical history of dementia, CVA, COPD, and tobacco abuse who presented from assisted living  facility where he lives [facility]) with altered mental status, hypernatremia and suspected pneumonia.  He was subsequently found to have bilateral lower extremity ischemic changes and was evaluated by vascular surgery for reports that he would need bilateral lower extremity amputation if aggressive care is sought.  Additionally, he was admitted with severe dehydration with hypernatremia and is reported to refuse to drink thickened water which is currently recommended diet for him.  Palliative consulted for goals of care.  Assessment: Patient Active Problem List   Diagnosis Date Noted  . Sepsis (HCC) 05/23/2020  . CAP (community acquired pneumonia) 05/23/2020  . Dementia (HCC) 08/27/2019  . AMS (altered mental status) 08/27/2019  . Altered mental status/Metabolic Encephalopathy due to Acute CVA 08/27/2019  . Aspiration pneumonia of right lung (HCC)   . Esophagitis   . Abnormal CT scan, esophagus   . Protein-calorie malnutrition, severe (HCC) 10/10/2018  . COPD exacerbation (HCC) 10/09/2018  . Dysphagia 10/09/2018  . Aspiration into airway 10/09/2018  . Lactic acidosis 10/09/2018  . Cerebral infarction due to unspecified mechanism   . CVA/-Small acute lacunar infarct in the left posterior lentiform/Internal Capsule 08/11/2015  . Cocaine abuse (HCC) 08/11/2015  . ARF (acute renal failure) (HCC) 08/11/2015  . Acute respiratory failure (HCC) 06/26/2011  . COPD (chronic obstructive pulmonary disease) with acute bronchitis (HCC) 06/26/2011  . Tobacco abuse   . Asthma     Recommendations/Plan:  Recommend residential hospice.   Prognosis likely less than 2 weeks.    Code Status:    Code Status Orders  (From admission, onward)         Start     Ordered   05/30/20 1051  Do not attempt resuscitation (DNR)  Continuous       Question Answer Comment  In the event of cardiac or respiratory ARREST Do not call a "code blue"   In the event of cardiac or respiratory ARREST Do not  perform Intubation, CPR, defibrillation or ACLS   In the event of cardiac or respiratory ARREST  Use medication by any route, position, wound care, and other measures to relive pain and suffering. May use oxygen, suction and manual treatment of airway obstruction as needed for comfort.      05/30/20 1050        Code Status History    Date Active Date Inactive Code Status Order ID Comments User Context   05/23/2020 2200 05/30/2020 1050 Full Code [id number]  Lupita Leash, MD Inpatient   08/27/2019 0221 08/27/2019 2028 Full Code [id number]  Olga Coaster, MD ED   10/09/2018 1622 10/13/2018 1736 Full Code [id number]  Erick Blinks, MD Inpatient   08/11/2015 1716 08/13/2015 1845 Full Code [id number]  Henderson Cloud, MD Inpatient   06/26/2011 0543 06/27/2011 1518 Full Code [id number]  Bevely Palmer, RN Inpatient   Advance Care Planning Activity       Prognosis:   Guarded, less than 2 weeks  Discharge Planning:  [facility] hospice facility as per daughter's request  Care plan was discussed with IDT  Thank you for allowing the Palliative Medicine Team to assist in the care of this patient.   Time In: 10 Time Out: 10.15 Total Time 15 Prolonged Time Billed No      Greater than 50%  of this time was spent counseling and coordinating care related to the above assessment and plan.  Rosalin Hawking, MD  Please contact Palliative Medicine Team phone at [phone] for questions and concerns.

## 2020-06-03 NOTE — Progress Notes (Signed)
PROGRESS NOTE  Brief Narrative: Jose Schaefer is a 69 y.o. male with a history of dementia, stroke, and COPD who presented from a nursing facility on 10/30 due to altered mental status found to have acute hypoxic respiratory failure, possibly due to aspiration pneumonia, AKI and profound dehydration with severe hypernatremia. He was intubated, started on empiric antibiotics and free water replacement. Ultimately he was extubated 11/1 but has not taken much of anything by mouth since that time, specifically turned off by any thickened liquids. Skin breakdown with ischemic wounds were noted due to severe lower extremity arterial insufficiency for which bilateral leg amputations were recommended.   In light of severe confusion, continued anorexia with severe protein malnutrition and profound dehydration with hypernatremia, and limb-threatening peripheral arterial disease, the family/HCPOA were engaged in goals of care discussions. They have confirmed that the patient would not wish for amputations, to be force-fed, or to continue down the path of aggressive medical interventions in this setting. Comfort care measures were decided upon and hospice placement is planned with prognosis expected to be days.   Subjective: Pt not significantly responsive to me this AM. Returned when his daughter came to the bedside, she lives far away and came before work, to discuss with her. She confirms that his current quality of life would not support the patient's desire to continue medical interventions that are not thought to improve his quality of life. She wishes for him to be transferred to hospice as soon as possible, preferably near her home in [place].   Objective: BP 96/63 (BP Location: Left Arm)    Pulse 98    Temp 98.2 F (36.8 C) (Oral)    Resp 20    Ht 5\' 8"  (1.727 m)    Wt 46.5 kg    SpO2 96%    BMI 15.59 kg/m   Gen: Chronically ill and unresponsive male Pulm: Nonlabored.   CV: RRR, no murmur, no JVD, no  edema GI: Soft, NT, ND, +BS  Neuro: Not responsive or cooperative with exam Skin: s/p LLE amputations with ischemic wounds wrapped c/d/i. Cool and dry peripherally.   Assessment & Plan: Discharged to hospice facility pending placement. He has not required comfort medications to date, though I added dilaudid prn dyspnea or pain, stopped other orders that are no longer indicated (CBG checks, abx, etc.). Supported the daughter through this time. Chaplain consulted as well.   Time spent: 15 minutes.  , MD Pager on amion 06/03/2020, 2:24 PM

## 2020-06-03 NOTE — Progress Notes (Signed)
Physical Therapy Discharge Patient Details Name: Jose Schaefer MRN: [id number] DOB: [birth year] Today's Date: 06/03/2020 Time:  -     Patient discharged from PT services secondary to medical decline - will need to re-order PT to resume therapy services. To go to Hospice.  Please see latest therapy progress note for current level of functioning and progress toward goals.      GP    Blanchard Kelch PT Acute Rehabilitation Services Pager [phone] Office [phone]  [name] 06/03/2020, 7:17 AM

## 2020-06-03 NOTE — Discharge Summary (Signed)
Physician Discharge Summary  Jose Schaefer ZOX:[id number] DOB: [birth year] DOA: 05/23/2020  PCP: [facility]  Admit date: 05/23/2020 Discharge date: 06/03/2020  Admitted From: SNF Disposition: Residential hospice   Recommendations for Outpatient Follow-up:  1. Comfort measures  Home Health: N/A Equipment/Devices: N/A Discharge Condition: Stable for transfer at the time of this dictation CODE STATUS: DNR, form signed and placed in chart Diet recommendation: Ad lib  Brief/Interim Summary: Jose Schaefer is a 69 y.o. male with a history of dementia, stroke, and COPD who presented from a nursing facility on 10/30 due to altered mental status found to have acute hypoxic respiratory failure, possibly due to aspiration pneumonia, AKI and profound dehydration with severe hypernatremia. He was intubated, started on empiric antibiotics and free water replacement. Ultimately he was extubated 11/1 but has not taken much of anything by mouth since that time, specifically turned off by any thickened liquids. Skin breakdown with ischemic wounds were noted due to severe lower extremity arterial insufficiency for which bilateral leg amputations were recommended.   In light of severe confusion, continued anorexia with severe protein malnutrition and profound dehydration with hypernatremia, and limb-threatening peripheral arterial disease, the family/HCPOA were engaged in goals of care discussions. They have confirmed that the patient would not wish for amputations, to be force-fed, or to continue down the path of aggressive medical interventions in this setting. Comfort care measures were decided upon and hospice placement is planned with prognosis expected to be days.   Discharge Diagnoses:  Active Problems:   Protein-calorie malnutrition, severe (HCC)   Sepsis (HCC)   CAP (community acquired pneumonia)  Discharge Instructions  Allergies as of 06/03/2020   No Known Allergies     Medication  List    STOP taking these medications   acetaminophen 325 MG tablet Commonly known as: TYLENOL   aspirin 81 MG EC tablet   atorvastatin 80 MG tablet Commonly known as: LIPITOR   budesonide-formoterol 160-4.5 MCG/ACT inhaler Commonly known as: Symbicort   clopidogrel 75 MG tablet Commonly known as: Plavix   donepezil 10 MG tablet Commonly known as: ARICEPT   guaiFENesin 100 MG/5ML Soln Commonly known as: Conservator, museum/gallery Powd   senna-docusate 8.6-50 MG tablet Commonly known as: Senokot-S     TAKE these medications   albuterol 108 (90 Base) MCG/ACT inhaler Commonly known as: VENTOLIN HFA Inhale 2 puffs into the lungs every 2 (two) hours as needed for wheezing or shortness of breath (cough).   ipratropium-albuterol 0.5-2.5 (3) MG/3ML Soln Commonly known as: DUONEB Take 3 mLs by nebulization every 6 (six) hours as needed.   LORazepam 1 MG tablet Commonly known as: ATIVAN Take 1 mg by mouth daily. Make take additional  tab prn   mirtazapine 15 MG tablet Commonly known as: REMERON Take 0.5 tablets (7.5 mg total) by mouth at bedtime.   QUEtiapine 100 MG tablet Commonly known as: SEROQUEL Take 100 mg by mouth at bedtime.   traZODone 50 MG tablet Commonly known as: DESYREL Take 50 mg by mouth at bedtime.       No Known Allergies  Consultations:  PCCM  Palliative care  Procedures/Studies: DG Chest 1 View  Result Date: 05/31/2020 CLINICAL DATA:  New onset cough, 6 days post extubation, history of COPD and dementia EXAM: CHEST  1 VIEW COMPARISON:  Portable exam 1228 hours compared to 05/23/2020 FINDINGS: RIGHT arm PICC line tip projects over SVC. Normal heart size, mediastinal contours, and pulmonary vascularity. Atherosclerotic calcification aorta. Emphysematous changes with  progressive LEFT lower lobe infiltrate consistent with pneumonia. No pleural effusion or pneumothorax. Bones demineralized. IMPRESSION: Emphysematous changes with  progressive LEFT lower lobe infiltrate consistent with pneumonia. Electronically Signed   By: Ulyses SouthwardMark  Boles M.D.   On: 05/31/2020 16:33   DG Abd 1 View  Result Date: 05/26/2020 CLINICAL DATA:  Feeding tube placement. EXAM: ABDOMEN - 1 VIEW COMPARISON:  05/25/2020. FINDINGS: Feeding tube noted with tip over the distal stomach/proximal duodenum. No bowel distention. No free air. Left base infiltrate cannot be excluded. Degenerative change lumbar spine and both hips. IMPRESSION: Feeding tube noted with tip over the distal stomach/proximal duodenum. Electronically Signed   By: Maisie Fushomas  Register   On: 05/26/2020 11:02   DG Abd 1 View  Result Date: 05/25/2020 CLINICAL DATA:  Feeding tube placement. EXAM: ABDOMEN - 1 VIEW COMPARISON:  None. FINDINGS: The bowel gas pattern is normal. Distal tip of feeding tube is seen in expected position of distal stomach. No radio-opaque calculi or other significant radiographic abnormality are seen. IMPRESSION: Distal tip of feeding tube seen in expected position of distal stomach. Electronically Signed   By: Lupita RaiderJames  Green Jr M.D.   On: 05/25/2020 12:46   CT HEAD WO CONTRAST  Result Date: 05/23/2020 CLINICAL DATA:  Neural deficit. EXAM: CT HEAD WITHOUT CONTRAST TECHNIQUE: Contiguous axial images were obtained from the base of the skull through the vertex without intravenous contrast. COMPARISON:  November 12, 2019 FINDINGS: Brain: No evidence of acute infarction, hemorrhage, hydrocephalus, extra-axial collection or mass lesion/mass effect. Marked deep white matter microangiopathy, stable. Moderate brain parenchymal volume loss with dilation of the ventricular system. Vascular: Calcific atherosclerotic disease of the intra cavernous carotid arteries. Skull: Normal. Negative for fracture or focal lesion. Sinuses/Orbits: No acute finding. Other: None. IMPRESSION: 1. No acute intracranial abnormality. 2. Atrophy, marked chronic microvascular disease. Electronically Signed   By:  Ted Mcalpineobrinka  Dimitrova M.D.   On: 05/23/2020 14:31   MR BRAIN WO CONTRAST  Result Date: 06/01/2020 CLINICAL DATA:  Mental status change EXAM: MRI HEAD WITHOUT CONTRAST TECHNIQUE: Multiplanar, multiecho pulse sequences of the brain and surrounding structures were obtained without intravenous contrast. COMPARISON:  05/23/2020 head CT and prior. 08/27/2019 MRI head and prior. FINDINGS: Examination was aborted early secondary to patient disposition. Only scout images were obtained, severely limiting evaluation. Scout images demonstrate diffuse cerebral atrophy with ex vacuo dilatation. No evidence of large intracranial mass or fluid collection. No midline shift. Orbits and paranasal sinuses are not well demonstrated secondary to motion artifact. Clear mastoid air cells. Visualized calvarium is intact. IMPRESSION: Aborted examination secondary to patient disposition. Scout images demonstrate no intracranial mass or large fluid collection. Cannot evaluate for intracranial hemorrhage or acute infarct on limited images. Please consider repeat MRI when patient is able to tolerate. Electronically Signed   By: Stana Buntinghikanele  Emekauwa M.D.   On: 06/01/2020 13:58   DG CHEST PORT 1 VIEW  Result Date: 05/23/2020 CLINICAL DATA:  COPD EXAM: PORTABLE CHEST 1 VIEW COMPARISON:  05/23/2020 FINDINGS: Endotracheal tube is 6 cm above the carina. NG tube is in the stomach. There is hyperinflation of the lungs compatible with COPD. Heart is normal size. Patchy airspace opacities throughout the left lung are similar to prior study. No effusions. IMPRESSION: Stable exam since prior study. Electronically Signed   By: Charlett NoseKevin  Dover M.D.   On: 05/23/2020 22:37   DG Chest Portable 1 View  Result Date: 05/23/2020 CLINICAL DATA:  Status post intubation EXAM: PORTABLE CHEST 1 VIEW COMPARISON:  May 23, 2020  FINDINGS: The ETT is in good position. The side port of the NG tube is just below the GE junction. The distal tip is at the bottom of  today's film, likely in the gastric body. No pneumothorax. Persistent opacity in the left mid lower lung, likely pneumonia. No other acute abnormalities or changes. IMPRESSION: 1. Support apparatus as above. The side port of the NG tube is just below the GE junction. Consider advancing the NG tube 2 or 3 cm. 2. Persistent left-sided infiltrate, concerning for pneumonia. Recommend follow-up to resolution. Electronically Signed   By: Gerome Sam III M.D   On: 05/23/2020 15:52   DG Chest Portable 1 View  Result Date: 05/23/2020 CLINICAL DATA:  Weakness EXAM: PORTABLE CHEST 1 VIEW COMPARISON:  Chest radiograph dated 11/21/2019. FINDINGS: The heart size is normal. Vascular calcifications are seen in the aortic arch. Mild patchy airspace opacities are seen in the left lower lung. The right lung is clear. There is no pleural effusion or pneumothorax. The visualized skeletal structures are unremarkable. IMPRESSION: Mild patchy airspace opacities in the left lower lung, suspicious for pneumonia. Electronically Signed   By: Romona Curls M.D.   On: 05/23/2020 14:30   DG Swallowing Func-Speech Pathology  Result Date: 05/27/2020 Objective Swallowing Evaluation: Type of Study: MBS-Modified Barium Swallow Study  Patient Details Name: Burnie Therien MRN: [id number] Date of Birth: 11-01-1950 Today's Date: 05/27/2020 Time: SLP Start Time (ACUTE ONLY): 1405 -SLP Stop Time (ACUTE ONLY): 1437 SLP Time Calculation (min) (ACUTE ONLY): 32 min Past Medical History: Past Medical History: Diagnosis Date . Asthma  . COPD (chronic obstructive pulmonary disease) (HCC)  . CVA (cerebral vascular accident) [facility]) 2001  [place] . Dementia (HCC)  . Tobacco abuse  Past Surgical History: Past Surgical History: Procedure Laterality Date . ESOPHAGEAL DILATION N/A 10/13/2018  Procedure: ESOPHAGEAL DILATION;  Surgeon: West Bali, MD;  Location: [facility];  Service: Endoscopy;  Laterality: N/A; . ESOPHAGOGASTRODUODENOSCOPY N/A 10/13/2018   Procedure: ESOPHAGOGASTRODUODENOSCOPY (EGD);  Surgeon: West Bali, MD;  Location: [facility];  Service: Endoscopy;  Laterality: N/A; HPI: Patient is a 69 y.o. male with PMH: dementia, CVA, COPD, tobacco abuse, who presented from SNF to [facility] with AMS. When EMS first evaluating patient, oxygen saturations were 75%. In ED, he was emergently intubated and a central line placed. CT Head was negative for acute intracranial abnormality. Patient had MBS in March of 2020, which reported moderate oropharyngeal dysphagia and recommended Dys 1, nectar thick liquids.  Pt required intubation from 10/30 - 11/1.  Pt has undergone clinical swallow evaluation with recommendations for NPO due to mentation, wet voice and lack of clearance ability.  Daughter [name] - present and reports she is his HCPOA.  She also admits he has premorbid dysphagia and coughs with solids therefore only consumes Ensures, puddings and applesauce.  Pt will not drink the liquid with thickener in it per daughter but he likes plain water.  Subjective: alert, verbalizing some but voice very weak Assessment / Plan / Recommendation CHL IP CLINICAL IMPRESSIONS 05/27/2020 Clinical Impression Pt presents with oral more than pharyngeal sensorimotor dysphagia mostly c/b delayed and discoordinated oral transiting due to weakness.  Lingual pumping present with premature spillage of boluses into pharynx.  Verbal cues "1, 2, 3, swallow" appeared helpful in improving timing of swallow. This oral control issue allows spillage of thin barium into trachea prior to swallow trigger.  Chin tuck posture was not tested due to extent of oral deficits.  Pharyngeal  swallow was minimally weak with decreased laryngeal closure and tongue base retraction.  Aspiration of thin was noted and was trace but did produce overt coughing.  No aspiration of nectar, pudding, nor cracker bolus apparent.  Pt will remain elevated aspiration and malnutrition risk due to his delay  in swallowing and weak cough.  Given he premorbidly consumed mostly Ensures, puddings, applesauce, etc only - recommend to initiate either full liquid (nectar) vs puree/nectar and allow free water protocol between meals to maximize hydration, QOL and comfort.  Recommend pt be allowed tsps thin and ice chips any time.  Will follow for family and pt education.  Pt's level of dysphagia will increase his risk of malnutrition due to his delays/laborious feeding. SLP Visit Diagnosis Dysphagia, oropharyngeal phase (R13.12) Attention and concentration deficit following -- Frontal lobe and executive function deficit following -- Impact on safety and function Moderate aspiration risk;Risk for inadequate nutrition/hydration   CHL IP TREATMENT RECOMMENDATION 05/27/2020 Treatment Recommendations Therapy as outlined in treatment plan below   Prognosis 05/27/2020 Prognosis for Safe Diet Advancement Fair Barriers to Reach Goals Time post onset;Cognitive deficits Barriers/Prognosis Comment -- CHL IP DIET RECOMMENDATION 05/27/2020 SLP Diet Recommendations Nectar thick liquid;Dysphagia 1 (Puree) solids Liquid Administration via Cup Medication Administration Crushed with puree Compensations Slow rate;Small sips/bites;Other (Comment) Postural Changes --   CHL IP OTHER RECOMMENDATIONS 05/27/2020 Recommended Consults -- Oral Care Recommendations Oral care QID Other Recommendations Order thickener from pharmacy;Have oral suction available   CHL IP FOLLOW UP RECOMMENDATIONS 05/27/2020 Follow up Recommendations 24 hour supervision/assistance;Skilled Nursing facility   CHL IP FREQUENCY AND DURATION 05/27/2020 Speech Therapy Frequency (ACUTE ONLY) min 2x/week Treatment Duration 2 weeks      CHL IP ORAL PHASE 05/27/2020 Oral Phase Impaired Oral - Pudding Teaspoon -- Oral - Pudding Cup -- Oral - Honey Teaspoon -- Oral - Honey Cup -- Oral - Nectar Teaspoon Lingual pumping;Delayed oral transit;Weak lingual manipulation;Premature spillage Oral - Nectar  Cup Lingual pumping;Premature spillage;Delayed oral transit;Weak lingual manipulation Oral - Nectar Straw Lingual pumping;Premature spillage;Delayed oral transit;Weak lingual manipulation;Decreased bolus cohesion Oral - Thin Teaspoon Premature spillage;Weak lingual manipulation;Delayed oral transit Oral - Thin Cup Delayed oral transit;Decreased bolus cohesion;Premature spillage;Weak lingual manipulation;Lingual pumping Oral - Thin Straw -- Oral - Puree Premature spillage;Decreased bolus cohesion;Weak lingual manipulation Oral - Mech Soft Impaired mastication;Weak lingual manipulation;Reduced posterior propulsion;Lingual pumping;Delayed oral transit;Premature spillage;Decreased bolus cohesion Oral - Regular -- Oral - Multi-Consistency -- Oral - Pill -- Oral Phase - Comment pt appeared to benefit from SLP verbalizing "1,2,3" to aid in oral transiting timing coordination (although inconsistent); initial delay with transiting was 40 seconds with nectar tsp, 13 seconds with puree , six seconds with thin, impaired oral control allows premature spillage of barium into pharynx and into larynx with thin, lingual pumping and delay noted across all consistencies which will increase his malnutrition and aspiration risk  CHL IP PHARYNGEAL PHASE 05/27/2020 Pharyngeal Phase Impaired Pharyngeal- Pudding Teaspoon -- Pharyngeal -- Pharyngeal- Pudding Cup -- Pharyngeal -- Pharyngeal- Honey Teaspoon -- Pharyngeal -- Pharyngeal- Honey Cup -- Pharyngeal -- Pharyngeal- Nectar Teaspoon Delayed swallow initiation-vallecula;Pharyngeal residue - valleculae Pharyngeal Material does not enter airway Pharyngeal- Nectar Cup Pharyngeal residue - valleculae Pharyngeal -- Pharyngeal- Nectar Straw -- Pharyngeal Material does not enter airway Pharyngeal- Thin Teaspoon Penetration/Aspiration during swallow;Delayed swallow initiation-vallecula;Reduced laryngeal elevation;Reduced airway/laryngeal closure Pharyngeal Material enters airway, remains ABOVE  vocal cords and not ejected out Pharyngeal- Thin Cup Penetration/Aspiration during swallow;Reduced airway/laryngeal closure;Reduced laryngeal elevation;Reduced tongue base retraction;Trace aspiration Pharyngeal Material enters airway,  passes BELOW cords and not ejected out despite cough attempt by patient Pharyngeal- Thin Straw Penetration/Aspiration before swallow;Penetration/Aspiration during swallow;Trace aspiration Pharyngeal Material enters airway, CONTACTS cords and then ejected out;Material enters airway, passes BELOW cords and not ejected out despite cough attempt by patient Pharyngeal- Puree Delayed swallow initiation-vallecula Pharyngeal -- Pharyngeal- Mechanical Soft Delayed swallow initiation-vallecula;Pharyngeal residue - valleculae Pharyngeal Material does not enter airway Pharyngeal- Regular -- Pharyngeal -- Pharyngeal- Multi-consistency -- Pharyngeal -- Pharyngeal- Pill -- Pharyngeal -- Pharyngeal Comment Reflexive cough response x2 during MBS with mild aspiration; aspirates were not cleared with reflexive nor cued cough; Pt did not conduct dry swallow on cue and attempts to "hock" - forcibly expectectorate did not clear mild vallecular retention; SLP advised pt to continue to work on strenghtening "hock" to clear pharynx  CHL IP CERVICAL ESOPHAGEAL PHASE 05/27/2020 Cervical Esophageal Phase [facility]  Upon esophageal sweep, pt appeared clear! Radiologist not present to confirm.  Pudding Teaspoon -- Pudding Cup -- Honey Teaspoon -- Honey Cup -- Nectar Teaspoon -- Nectar Cup -- Nectar Straw -- Thin Teaspoon -- Thin Cup -- Thin Straw -- Puree -- Mechanical Soft -- Regular -- Multi-consistency -- Pill -- Cervical Esophageal Comment -- [name], MS [facility] SLP Acute Rehab Services Office [phone] Pager [phone] [name] 05/27/2020, 3:44 PM              ECHOCARDIOGRAM COMPLETE  Result Date: 05/31/2020    ECHOCARDIOGRAM REPORT   Patient Name:   Jose Schaefer Date of Exam: 05/31/2020 Medical Rec #:   [id number]   Height:       68.0 in Accession #:    [id number]  Weight:       110.9 lb Date of Birth:  Aug 17, 1950    BSA:          1.591 m Patient Age:    69 years    BP:           110/57 mmHg Patient Gender: M           HR:           89 bpm. Exam Location:  Inpatient Procedure: 2D Echo Indications:    Abnormal ECG 794.31 / R94.31  History:        Patient has prior history of Echocardiogram examinations, most                 recent 08/27/2019. Protein-calorie malnutrition, Sepsis,                 Pneumonia, Acute metabolic encephalopathy, Thrombocytopenia.  Sonographer:    Leta Jungling RDCS Referring Phys: [id number] ELIZABETH G MATHEWS IMPRESSIONS  1. Left ventricular ejection fraction, by estimation, is >75%. The left ventricle has hyperdynamic function. The left ventricle has no regional wall motion abnormalities. Left ventricular diastolic parameters are consistent with Grade I diastolic dysfunction (impaired relaxation).  2. Right ventricular systolic function is normal. The right ventricular size is normal. There is normal pulmonary artery systolic pressure.  3. The mitral valve is normal in structure. No evidence of mitral valve regurgitation. No evidence of mitral stenosis.  4. The aortic valve is tricuspid. Aortic valve regurgitation is mild. No aortic stenosis is present.  5. The inferior vena cava is normal in size with greater than 50% respiratory variability, suggesting right atrial pressure of 3 mmHg. FINDINGS  Left Ventricle: Left ventricular ejection fraction, by estimation, is >75%. The left ventricle has hyperdynamic function. The left ventricle has no regional wall motion abnormalities. The left ventricular  internal cavity size was normal in size. There is no left ventricular hypertrophy. Left ventricular diastolic parameters are consistent with Grade I diastolic dysfunction (impaired relaxation). Normal left ventricular filling pressure. Right Ventricle: The right ventricular size is normal. No  increase in right ventricular wall thickness. Right ventricular systolic function is normal. There is normal pulmonary artery systolic pressure. The tricuspid regurgitant velocity is 2.14 m/s, and  with an assumed right atrial pressure of 3 mmHg, the estimated right ventricular systolic pressure is 21.3 mmHg. Left Atrium: Left atrial size was normal in size. Right Atrium: Right atrial size was normal in size. Pericardium: There is no evidence of pericardial effusion. Mitral Valve: The mitral valve is normal in structure. No evidence of mitral valve regurgitation. No evidence of mitral valve stenosis. Tricuspid Valve: The tricuspid valve is normal in structure. Tricuspid valve regurgitation is trivial. No evidence of tricuspid stenosis. Aortic Valve: The aortic valve is tricuspid. Aortic valve regurgitation is mild. Aortic regurgitation PHT measures 393 msec. No aortic stenosis is present. Aortic valve mean gradient measures 2.3 mmHg. Aortic valve peak gradient measures 5.2 mmHg. Aortic  valve area, by VTI measures 2.92 cm. Pulmonic Valve: The pulmonic valve was not well visualized. Pulmonic valve regurgitation is not visualized. No evidence of pulmonic stenosis. Aorta: The aortic root is normal in size and structure. Pulmonary Artery: Indeterminant PASP, inadequate TR jet. Venous: The inferior vena cava is normal in size with greater than 50% respiratory variability, suggesting right atrial pressure of 3 mmHg. IAS/Shunts: No atrial level shunt detected by color flow Doppler.  LEFT VENTRICLE PLAX 2D LVIDd:         3.14 cm  Diastology LVIDs:         1.94 cm  LV e' medial:    4.90 cm/s LV PW:         0.86 cm  LV E/e' medial:  11.5 LV IVS:        1.00 cm  LV e' lateral:   6.42 cm/s LVOT diam:     2.10 cm  LV E/e' lateral: 8.8 LV SV:         51 LV SV Index:   32 LVOT Area:     3.46 cm  RIGHT VENTRICLE RV S prime:     18.70 cm/s TAPSE (M-mode): 2.1 cm LEFT ATRIUM             Index       RIGHT ATRIUM           Index LA  diam:        1.90 cm 1.19 cm/m  RA Area:     12.30 cm LA Vol (A2C):   32.4 ml 20.37 ml/m RA Volume:   25.00 ml  15.72 ml/m LA Vol (A4C):   26.2 ml 16.47 ml/m LA Biplane Vol: 30.7 ml 19.30 ml/m  AORTIC VALVE AV Area (Vmax):    2.81 cm AV Area (Vmean):   2.48 cm AV Area (VTI):     2.92 cm AV Vmax:           114.02 cm/s AV Vmean:          68.887 cm/s AV VTI:            0.175 m AV Peak Grad:      5.2 mmHg AV Mean Grad:      2.3 mmHg LVOT Vmax:         92.40 cm/s LVOT Vmean:        49.400 cm/s  LVOT VTI:          0.147 m LVOT/AV VTI ratio: 0.84 AI PHT:            393 msec  AORTA Ao Root diam: 3.00 cm Ao Asc diam:  3.40 cm MITRAL VALVE               TRICUSPID VALVE MV Area (PHT): 2.39 cm    TR Peak grad:   18.3 mmHg MV Decel Time: 317 msec    TR Vmax:        214.00 cm/s MV E velocity: 56.30 cm/s MV A velocity: 86.30 cm/s  SHUNTS MV E/A ratio:  0.65        Systemic VTI:  0.15 m                            Systemic Diam: 2.10 cm Dina Rich MD Electronically signed by Dina Rich MD Signature Date/Time: 05/31/2020/12:52:31 PM    Final    VAS [place] LOWER EXTREMITY ARTERIAL DUPLEX  Result Date: 05/31/2020 LOWER EXTREMITY ARTERIAL DUPLEX STUDY Indications: Ulceration. High Risk Factors: Current smoker, prior CVA.  Current ABI: Unable to complete due to wounds, bandaging, and patient in              contracted state Limitations: bandaging, wound location, contracture Comparison Study: No prior study Performing Technologist: Gertie Fey MHA, RDMS, RVT, RDCS  Examination Guidelines: A complete evaluation includes B-mode imaging, spectral Doppler, color Doppler, and power Doppler as needed of all accessible portions of each vessel. Bilateral testing is considered an integral part of a complete examination. Limited examinations for reoccurring indications may be performed as noted.  +-----------+--------+-----+--------+----------+-------------------+ RIGHT      PSV cm/sRatioStenosisWaveform  Comments             +-----------+--------+-----+--------+----------+-------------------+ CFA Distal 72                   monophasic                    +-----------+--------+-----+--------+----------+-------------------+ DFA        124                  triphasic                     +-----------+--------+-----+--------+----------+-------------------+ SFA Prox   99                   monophasic                    +-----------+--------+-----+--------+----------+-------------------+ SFA Mid    96                   monophasic                    +-----------+--------+-----+--------+----------+-------------------+ SFA Distal 167                  monophasic                    +-----------+--------+-----+--------+----------+-------------------+ POP Distal 119                  monophasic                    +-----------+--------+-----+--------+----------+-------------------+ ATA Distal 17  monophasic                    +-----------+--------+-----+--------+----------+-------------------+ PTA Distal 58                   monophasic                    +-----------+--------+-----+--------+----------+-------------------+ PERO Distal                               Unable to visualize +-----------+--------+-----+--------+----------+-------------------+  +-----------+--------+-----+---------------+----------+--------------------+ LEFT       PSV cm/sRatioStenosis       Waveform  Comments             +-----------+--------+-----+---------------+----------+--------------------+ CFA Distal                                       Unable to vissualize +-----------+--------+-----+---------------+----------+--------------------+ DFA                                              Unable to vissualize +-----------+--------+-----+---------------+----------+--------------------+ SFA Prox   118                         monophasic                      +-----------+--------+-----+---------------+----------+--------------------+ SFA Mid    326          50-74% stenosismonophasic                     +-----------+--------+-----+---------------+----------+--------------------+ SFA Distal 120                         monophasic                     +-----------+--------+-----+---------------+----------+--------------------+ POP Distal 69                          monophasic                     +-----------+--------+-----+---------------+----------+--------------------+ ATA Distal 30                          monophasic                     +-----------+--------+-----+---------------+----------+--------------------+ PTA Distal 31                          monophasic                     +-----------+--------+-----+---------------+----------+--------------------+ PERO Distal                                      Unable to vissualize +-----------+--------+-----+---------------+----------+--------------------+  Summary: Right: Monophasic flow at the CFA, suggestive of possible aorto-iliac disease. Monophasic flow seen throughout remainder of right lower extremity. 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  See table(s) above for measurements and observations. Electronically signed by  Lemar Livings MD on 05/31/2020 at 8:38:25 PM.    Final    [place] EKG SITE RITE  Result Date: 05/27/2020 If Site Rite image not attached, placement could not be confirmed due to current cardiac rhythm.    Subjective: Pt not significantly responsive to me this AM. Returned when his daughter came to the bedside, she lives far away and came before work, to discuss with her. She confirms that his current quality of life would not support the patient's desire to continue medical interventions that are not thought to improve his quality of life. She wishes for him to be transferred to hospice as soon as possible, preferably near her home in  [place].   Discharge Exam: Vitals:   06/03/20 0524 06/03/20 0538  BP: 107/70 96/63  Pulse: 90 98  Resp: 20 20  Temp: 100 F (37.8 C) 98.2 F (36.8 C)  SpO2: 100% 96%   Gen: Chronically ill and unresponsive male Pulm: Nonlabored.   CV: RRR, no murmur, no JVD, no edema GI: Soft, NT, ND, +BS  Neuro: Not responsive or cooperative with exam Skin: s/p LLE amputations with ischemic wounds wrapped c/d/i. Cool and dry peripherally.   Labs: Basic Metabolic Panel: Recent Labs  Lab 05/29/20 0303 05/29/20 0303 05/30/20 0854 05/30/20 1806 05/31/20 0113 06/01/20 0530 06/02/20 0445  NA 155*   < > 146* 145 137 144 141  K 3.2*   < > 2.8* 3.6 3.0* 3.1* 2.9*  CL 118*   < > 115* 115* 109 116* 114*  CO2 22   < > 20* 24 23 22  21*  GLUCOSE 121*   < > 132* 118* 340* 100* 102*  BUN 31*   < > 23 22 20 17 14   CREATININE 2.02*   < > 1.76* 1.72* 1.59* 1.39* 1.39*  CALCIUM 7.3*   < > 7.1* 7.3* 7.0* 7.3* 7.7*  MG 1.8  --   --   --  1.9 1.8 1.7  PHOS  --   --   --   --  1.9* 2.6 2.7   < > = values in this interval not displayed.   Liver Function Tests: Recent Labs  Lab 05/29/20 0303  AST 83*  ALT 61*  ALKPHOS 65  BILITOT 0.8  PROT 6.0*  ALBUMIN 2.0*   CBC: Recent Labs  Lab 05/30/20 0328  WBC 10.0  HGB 9.1*  HCT 28.7*  MCV 87.8  PLT 147*   CBG: Recent Labs  Lab 06/02/20 1558 06/02/20 2010 06/03/20 0042 06/03/20 0426 06/03/20 0814  GLUCAP 110* 97 106* 103* 114*   Urinalysis    Component Value Date/Time   COLORURINE AMBER (A) 05/23/2020 1345   APPEARANCEUR HAZY (A) 05/23/2020 1345   LABSPEC 1.017 05/23/2020 1345   PHURINE 5.0 05/23/2020 1345   GLUCOSEU NEGATIVE 05/23/2020 1345   HGBUR MODERATE (A) 05/23/2020 1345   BILIRUBINUR NEGATIVE 05/23/2020 1345   KETONESUR NEGATIVE 05/23/2020 1345   PROTEINUR 30 (A) 05/23/2020 1345   UROBILINOGEN 0.2 06/26/2011 0606   NITRITE NEGATIVE 05/23/2020 1345   LEUKOCYTESUR NEGATIVE 05/23/2020 1345   Time coordinating discharge:  Approximately 40 minutes  05/25/2020, MD  [facility] 06/03/2020, 2:48 PM

## 2020-06-03 NOTE — Progress Notes (Signed)
SLP Cancellation Note  Patient Details Name: Con Arganbright MRN: [id number] DOB: 05/19/1951   Cancelled treatment:       Reason Eval/Treat Not Completed: Other (comment) (pt now comfort care, please reorder if indicated)  [name], MS [facility] SLP Acute Rehab Services Office [phone] Pager [phone]   [name] 06/03/2020, 8:31 AM

## 2020-06-03 NOTE — TOC Progression Note (Signed)
Transition of Care [facility]) - Progression Note    Patient Details  Name: Jose Schaefer MRN: [id number] Date of Birth: 10-10-1950  Transition of Care [facility]) CM/SW Contact  Geni Bers, RN Phone Number: 06/03/2020, 2:34 PM  Clinical Narrative:     [facility]   Expected Discharge Plan: Skilled Nursing Facility ([facility]) Barriers to Discharge: Barriers Unresolved (comment) (ventilator and sepsis)  Expected Discharge Plan and Services Expected Discharge Plan: Skilled Nursing Facility ([facility])   Discharge Planning Services: CM Consult   Living arrangements for the past 2 months: Skilled Nursing Facility Expected Discharge Date: 06/03/20                                     Social Determinants of Health (SDOH) Interventions    Readmission Risk Interventions No flowsheet data found.

## 2020-06-03 NOTE — Progress Notes (Signed)
Chaplain responded to Spiritual Consult, asking for prayers at this patient's end of life stage.  Chaplain called out to patient and touched his leg.  Patient was non-responsive and gave no indication he was aware of Chaplain's presence.  Chaplain prayed  at bedside.  Nurse reported she had just given him some pain meds, noting patient had been mostly non-communicative with her as well.  Chaplain stands by for another visit as needed.  Vernell Morgans Chaplain

## 2020-06-03 NOTE — Plan of Care (Signed)
  Problem: Education: Goal: Knowledge of General Education information will improve Description Including pain rating scale, medication(s)/side effects and non-pharmacologic comfort measures Outcome: Progressing   Problem: Health Behavior/Discharge Planning: Goal: Ability to manage health-related needs will improve Outcome: Progressing   

## 2020-06-04 LAB — SARS CORONAVIRUS 2 BY RT PCR (HOSPITAL ORDER, PERFORMED IN ~~LOC~~ HOSPITAL LAB): SARS Coronavirus 2: NEGATIVE

## 2020-06-04 MED ORDER — IPRATROPIUM-ALBUTEROL 0.5-2.5 (3) MG/3ML IN SOLN: 3.0000 mL | RESPIRATORY_TRACT | Status: DC | PRN

## 2020-06-04 NOTE — Progress Notes (Signed)
Called report to [facility]; gave report to Vevelyn Royals RN. Daughter notified that he is ready for transportation and she will be notified once transportation arrives. Val Eagle

## 2020-06-04 NOTE — Progress Notes (Signed)
PROGRESS NOTE  Brief Narrative: Jose Schaefer is a 69 y.o. male with a history of dementia, stroke, and COPD who presented from a nursing facility on 10/30 due to altered mental status found to have acute hypoxic respiratory failure, possibly due to aspiration pneumonia, AKI and profound dehydration with severe hypernatremia. He was intubated, started on empiric antibiotics and free water replacement. Ultimately he was extubated 11/1 but has not taken much of anything by mouth since that time, specifically turned off by any thickened liquids. Skin breakdown with ischemic wounds were noted due to severe lower extremity arterial insufficiency for which bilateral leg amputations were recommended.   In light of severe confusion, continued anorexia with severe protein malnutrition and profound dehydration with hypernatremia, and limb-threatening peripheral arterial disease, the family/HCPOA were engaged in goals of care discussions. They have confirmed that the patient would not wish for amputations, to be force-fed, or to continue down the path of aggressive medical interventions in this setting. Comfort care measures were decided upon and hospice placement is planned with prognosis expected to be days.   Subjective: Pt more responsive this morning, no dyspnea or wheezing. Hasn't eaten still. No family at bedside.  Objective: BP (!) 79/68 (BP Location: Left Arm)   Pulse 94   Temp (!) 97.5 F (36.4 C) (Axillary)   Resp 18   Ht 5\' 8"  (1.727 m)   Wt 46.5 kg   SpO2 97%   BMI 15.59 kg/m   Gen: Chronically ill and frail male in no distress Pulm: Nonlabored, no wheezing. Skin: s/p LLE amputation with ischemic wounds wrapped c/d/i. Warm and dry BLEs.  Assessment & Plan: Remains stable for transfer to, and appropriate for placement in, residential hospice facility. Per CM, no beds available at this time. We will continue prn's for comfort as ordered.  Time spent: 15 minutes.  , MD Pager on  amion 06/04/2020, 9:39 AM

## 2020-06-04 NOTE — TOC Progression Note (Signed)
Transition of Care [facility]) - Progression Note    Patient Details  Name: Jose Schaefer MRN: [id number] Date of Birth: 1951/06/09  [facility]) CM/SW Contact  Geni Bers, RN Phone Number: 06/04/2020, 11:22 AM  Clinical Narrative:    Pt has bed offer for [facility]. A call to pt's daughter was made for her to call [facility]. MD, RN was made aware of bed offer. Need rapid COVID test. If negative pt made discharge to Hospice.    Expected Discharge Plan: Skilled Nursing Facility ([facility]) Barriers to Discharge: Barriers Unresolved (comment) (ventilator and sepsis)  Expected Discharge Plan and Services Expected Discharge Plan: Skilled Nursing Facility ([facility])   Discharge Planning Services: CM Consult   Living arrangements for the past 2 months: Skilled Nursing Facility Expected Discharge Date: 06/04/20                                     Social Determinants of Health (SDOH) Interventions    Readmission Risk Interventions No flowsheet data found.

## 2020-06-04 NOTE — TOC Progression Note (Signed)
Transition of Care [facility]) - Progression Note    Patient Details  Name: Jose Schaefer MRN: [id number] Date of Birth: [birth year]  Transition of Care [facility]) CM/SW Contact  Geni Bers, RN Phone Number: 06/04/2020, 1:19 PM  Clinical Narrative:    [facility] was called with Negative COVID test. [facility] was called for transportation.    Expected Discharge Plan: Skilled Nursing Facility ([facility]) Barriers to Discharge: Barriers Unresolved (comment) (ventilator and sepsis)  Expected Discharge Plan and Services Expected Discharge Plan: Skilled Nursing Facility ([facility])   Discharge Planning Services: CM Consult   Living arrangements for the past 2 months: Skilled Nursing Facility Expected Discharge Date: 06/04/20                                     Social Determinants of Health (SDOH) Interventions    Readmission Risk Interventions No flowsheet data found.

## 2020-06-04 NOTE — Progress Notes (Signed)
Daughter Alcario Drought) notified of patient departure. SRP, RN

## 2020-06-24 DEATH — deceased
